# Patient Record
Sex: Female | Born: 1980 | Race: Black or African American | Hispanic: No | Marital: Single | Smoking: Current every day smoker
Health system: Southern US, Community
[De-identification: ages and names within clinical notes are randomized; demographics above are authoritative.]

## PROBLEM LIST (undated history)

## (undated) DIAGNOSIS — F1010 Alcohol abuse, uncomplicated: Secondary | ICD-10-CM

## (undated) DIAGNOSIS — F329 Major depressive disorder, single episode, unspecified: Secondary | ICD-10-CM

## (undated) DIAGNOSIS — F1910 Other psychoactive substance abuse, uncomplicated: Secondary | ICD-10-CM

## (undated) DIAGNOSIS — F1410 Cocaine abuse, uncomplicated: Secondary | ICD-10-CM

## (undated) DIAGNOSIS — F32A Depression, unspecified: Secondary | ICD-10-CM

## (undated) DIAGNOSIS — F419 Anxiety disorder, unspecified: Secondary | ICD-10-CM

---

## 2013-02-08 ENCOUNTER — Encounter (HOSPITAL_COMMUNITY): Payer: Self-pay | Admitting: Emergency Medicine

## 2013-02-08 ENCOUNTER — Emergency Department (HOSPITAL_COMMUNITY)
Admission: EM | Admit: 2013-02-08 | Discharge: 2013-02-09 | Disposition: A | Discharge status: Other Acute Inpatient Hospital | Payer: MEDICAID | Source: Home / Self Care | Attending: Emergency Medicine | Admitting: Emergency Medicine

## 2013-02-08 DIAGNOSIS — E876 Hypokalemia: Secondary | ICD-10-CM

## 2013-02-08 DIAGNOSIS — Z79899 Other long term (current) drug therapy: Secondary | ICD-10-CM | POA: Insufficient documentation

## 2013-02-08 DIAGNOSIS — F419 Anxiety disorder, unspecified: Secondary | ICD-10-CM

## 2013-02-08 DIAGNOSIS — F329 Major depressive disorder, single episode, unspecified: Secondary | ICD-10-CM

## 2013-02-08 DIAGNOSIS — Z880 Allergy status to penicillin: Secondary | ICD-10-CM | POA: Insufficient documentation

## 2013-02-08 DIAGNOSIS — F1010 Alcohol abuse, uncomplicated: Secondary | ICD-10-CM | POA: Insufficient documentation

## 2013-02-08 DIAGNOSIS — F1210 Cannabis abuse, uncomplicated: Secondary | ICD-10-CM | POA: Insufficient documentation

## 2013-02-08 DIAGNOSIS — F1910 Other psychoactive substance abuse, uncomplicated: Secondary | ICD-10-CM

## 2013-02-08 DIAGNOSIS — Z8659 Personal history of other mental and behavioral disorders: Secondary | ICD-10-CM | POA: Insufficient documentation

## 2013-02-08 DIAGNOSIS — F17200 Nicotine dependence, unspecified, uncomplicated: Secondary | ICD-10-CM | POA: Insufficient documentation

## 2013-02-08 HISTORY — DX: Major depressive disorder, single episode, unspecified: F32.9

## 2013-02-08 HISTORY — DX: Depression, unspecified: F32.A

## 2013-02-08 LAB — RAPID URINE DRUG SCREEN, HOSP PERFORMED
Amphetamines: NOT DETECTED
Benzodiazepines: NOT DETECTED
Cocaine: POSITIVE — AB
Opiates: NOT DETECTED

## 2013-02-08 LAB — ETHANOL: Alcohol, Ethyl (B): 60 mg/dL — ABNORMAL HIGH (ref 0–11)

## 2013-02-08 LAB — COMPREHENSIVE METABOLIC PANEL
AST: 32 U/L (ref 0–37)
Albumin: 3.8 g/dL (ref 3.5–5.2)
BUN: 12 mg/dL (ref 6–23)
CO2: 31 mEq/L (ref 19–32)
Calcium: 9.8 mg/dL (ref 8.4–10.5)
Creatinine, Ser: 1.23 mg/dL — ABNORMAL HIGH (ref 0.50–1.10)
GFR calc non Af Amer: 57 mL/min — ABNORMAL LOW (ref >90)

## 2013-02-08 LAB — CBC
HCT: 42.7 % (ref 36.0–46.0)
Hemoglobin: 15.6 g/dL — ABNORMAL HIGH (ref 12.0–15.0)
MCH: 33.1 pg (ref 26.0–34.0)
MCV: 90.5 fL (ref 78.0–100.0)
Platelets: 258 10*3/uL (ref 150–400)
RBC: 4.72 MIL/uL (ref 3.87–5.11)
RDW: 13.3 % (ref 11.5–15.5)

## 2013-02-08 LAB — SALICYLATE LEVEL: Salicylate Lvl: <2 mg/dL — ABNORMAL LOW (ref 2.8–20.0)

## 2013-02-08 MED ORDER — LORAZEPAM 2 MG/ML IJ SOLN: 1.0000 mg | Freq: Four times a day (QID) | INTRAMUSCULAR | Status: DC | PRN

## 2013-02-08 MED ORDER — LORAZEPAM 1 MG PO TABS
0.0000 mg | ORAL_TABLET | Freq: Four times a day (QID) | ORAL | Status: DC
  Administered 2013-02-08: 2 mg via ORAL
  Administered 2013-02-09: 1 mg via ORAL
  Filled 2013-02-08: qty 2
  Filled 2013-02-08: qty 1

## 2013-02-08 MED ORDER — LORAZEPAM 1 MG PO TABS: 1.0000 mg | ORAL_TABLET | Freq: Four times a day (QID) | ORAL | Status: DC | PRN

## 2013-02-08 MED ORDER — VITAMIN B-1 100 MG PO TABS
100.0000 mg | ORAL_TABLET | Freq: Every day | ORAL | Status: DC
  Administered 2013-02-08 – 2013-02-09 (×2): 100 mg via ORAL
  Filled 2013-02-08 (×2): qty 1

## 2013-02-08 MED ORDER — THIAMINE HCL 100 MG/ML IJ SOLN: 100.0000 mg | Freq: Every day | INTRAMUSCULAR | Status: DC

## 2013-02-08 MED ORDER — LORAZEPAM 1 MG PO TABS: 0.0000 mg | ORAL_TABLET | Freq: Two times a day (BID) | ORAL | Status: DC

## 2013-02-08 MED ORDER — PROMETHAZINE HCL 25 MG PO TABS
25.0000 mg | ORAL_TABLET | Freq: Once | ORAL | Status: AC
  Administered 2013-02-08: 25 mg via ORAL
  Filled 2013-02-08: qty 1

## 2013-02-08 MED ORDER — ADULT MULTIVITAMIN W/MINERALS CH
1.0000 | ORAL_TABLET | Freq: Every day | ORAL | Status: DC
  Administered 2013-02-08 – 2013-02-09 (×2): 1 via ORAL
  Filled 2013-02-08 (×2): qty 1

## 2013-02-08 MED ORDER — PANTOPRAZOLE SODIUM 40 MG PO TBEC
40.0000 mg | DELAYED_RELEASE_TABLET | Freq: Every day | ORAL | Status: DC
  Administered 2013-02-09: 40 mg via ORAL
  Filled 2013-02-08: qty 1

## 2013-02-08 MED ORDER — POTASSIUM CHLORIDE CRYS ER 20 MEQ PO TBCR
40.0000 meq | EXTENDED_RELEASE_TABLET | Freq: Once | ORAL | Status: AC
  Administered 2013-02-08: 40 meq via ORAL
  Filled 2013-02-08: qty 2

## 2013-02-08 MED ORDER — FOLIC ACID 1 MG PO TABS
1.0000 mg | ORAL_TABLET | Freq: Every day | ORAL | Status: DC
  Administered 2013-02-08 – 2013-02-09 (×2): 1 mg via ORAL
  Filled 2013-02-08 (×2): qty 1

## 2013-02-08 NOTE — BH Assessment (Signed)
This TTS attempted to assess patient at 2120 pt was lethagic and sleepy and could not stay awake to answer questions during TTS assessment. Pt will need to be assessed in the morning. Spoke with PA [employer]" S.  At [facility] ED who is aware and in agreement with patient being assessed tommorow morning.   Glorious Peach, MS, LCASA Assessment Counselor

## 2013-02-08 NOTE — ED Provider Notes (Signed)
CSN: [id number]     Arrival date & time 02/08/13  1816 History   First MD Initiated Contact with Patient 02/08/13 1910     Chief Complaint  Patient presents with  . needs detox    (Consider location/radiation/quality/duration/timing/severity/associated sxs/prior Treatment) HPI Comments: 32 year old female presents for voluntary detox. She states that she has been abusing alcohol and cocaine for a long time and has been spending $1000 a day on these addictions. She states that she last drank alcohol about 15 minutes prior to arrival and last had cocaine last night. She denies any suicidal or homicidal ideations. She's not know if she's been to detox program before.   Past Medical History  Diagnosis Date  . Depression    History reviewed. No pertinent past surgical history. No family history on file. History  Substance Use Topics  . Smoking status: Current Every Day Smoker  . Smokeless tobacco: Not on file  . Alcohol Use: Yes   OB History   Grav Para Term Preterm Abortions TAB SAB Ect Mult Living                 Review of Systems  Respiratory: Negative for shortness of breath.   Cardiovascular: Negative for chest pain.  Gastrointestinal: Negative for vomiting and abdominal pain.  Psychiatric/Behavioral: Negative for suicidal ideas, self-injury and agitation.  All other systems reviewed and are negative.    Allergies  Penicillins and Zofran  Home Medications   Current Outpatient Rx  Name  Route  Sig  Dispense  Refill  . pantoprazole (PROTONIX) 40 MG tablet   Oral   Take 40 mg by mouth daily.         . potassium chloride SA (K-DUR,KLOR-CON) 20 MEQ tablet   Oral   Take 20 mEq by mouth 2 (two) times daily.          BP 117/72  Pulse 75  Temp(Src) 98.2 F (36.8 C) (Oral)  Resp 18  SpO2 98%  LMP 01/08/2013 Physical Exam  Nursing note and vitals reviewed. Constitutional: She is oriented to person, place, and time. She appears well-developed and well-nourished.   HENT:  Head: Normocephalic and atraumatic.  Right Ear: External ear normal.  Left Ear: External ear normal.  Nose: Nose normal.  Eyes: Right eye exhibits no discharge. Left eye exhibits no discharge.  Cardiovascular: Normal rate, regular rhythm and normal heart sounds.   Pulmonary/Chest: Effort normal and breath sounds normal.  Abdominal: Soft. There is no tenderness.  Neurological: She is alert and oriented to person, place, and time.  Skin: Skin is warm and dry.    ED Course  Procedures (including critical care time) Labs Review Labs Reviewed  CBC - Abnormal; Notable for the following:    Hemoglobin 15.6 (*)    MCHC 36.5 (*)    All other components within normal limits  COMPREHENSIVE METABOLIC PANEL - Abnormal; Notable for the following:    Potassium 2.7 (*)    Chloride 93 (*)    Glucose, Bld 102 (*)    Creatinine, Ser 1.23 (*)    GFR calc non Af Amer 57 (*)    GFR calc Af Amer 66 (*)    All other components within normal limits  ETHANOL - Abnormal; Notable for the following:    Alcohol, Ethyl (B) 60 (*)    All other components within normal limits  SALICYLATE LEVEL - Abnormal; Notable for the following:    Salicylate Lvl <2.0 (*)    All other components  within normal limits  URINE RAPID DRUG SCREEN (HOSP PERFORMED) - Abnormal; Notable for the following:    Cocaine POSITIVE (*)    All other components within normal limits  ACETAMINOPHEN LEVEL   Imaging Review No results found.  EKG Interpretation   None       MDM   1. Alcohol abuse   2. Drug abuse   3. Hypokalemia    Will consult [facility] for voluntary detox. Patient has hypokalemia but this is chronic (takes daily potassium), will supplement with PO while in ED.    Audree Camel, MD 02/08/13 2051

## 2013-02-08 NOTE — ED Notes (Signed)
Pt too nauseous to tolerate large pill. MD notified. Will attempt again after ativan and phenergan.

## 2013-02-08 NOTE — BH Assessment (Signed)
Spoke with EDP Dr.Rancour who reports that Dr. Criss Alvine whom is now gone for the evening evaluated patient earlier  and  requested TTS consult for patient who is requesting Alcohol Detox.   Glorious Peach, MS, LCASA Assessment Counselor

## 2013-02-08 NOTE — ED Notes (Signed)
The pt waqnts to be detoxed from alcohol and cocaine.  Alcohol 5 minutes and last cocaine was lastnight

## 2013-02-09 ENCOUNTER — Encounter (HOSPITAL_COMMUNITY): Payer: Self-pay

## 2013-02-09 ENCOUNTER — Inpatient Hospital Stay (HOSPITAL_COMMUNITY)
Admission: AD | Admit: 2013-02-09 | Discharge: 2013-02-13 | DRG: 897 | Disposition: A | Discharge status: Home / Self Care | Payer: MEDICAID | Source: Intra-hospital | Attending: Psychiatry | Admitting: Psychiatry

## 2013-02-09 DIAGNOSIS — F1020 Alcohol dependence, uncomplicated: Secondary | ICD-10-CM

## 2013-02-09 DIAGNOSIS — F1420 Cocaine dependence, uncomplicated: Secondary | ICD-10-CM | POA: Diagnosis present

## 2013-02-09 LAB — MAGNESIUM: Magnesium: 1.6 mg/dL (ref 1.5–2.5)

## 2013-02-09 LAB — POTASSIUM
Potassium: 2.6 mEq/L — CL (ref 3.5–5.1)
Potassium: 3.8 mEq/L (ref 3.5–5.1)

## 2013-02-09 MED ORDER — ONDANSETRON 4 MG PO TBDP
4.0000 mg | ORAL_TABLET | Freq: Four times a day (QID) | ORAL | Status: AC | PRN
  Filled 2013-02-09 (×2): qty 1

## 2013-02-09 MED ORDER — LOPERAMIDE HCL 2 MG PO CAPS: 2.0000 mg | ORAL_CAPSULE | ORAL | Status: DC | PRN

## 2013-02-09 MED ORDER — POTASSIUM CHLORIDE CRYS ER 20 MEQ PO TBCR
40.0000 meq | EXTENDED_RELEASE_TABLET | Freq: Once | ORAL | Status: AC
  Administered 2013-02-09: 40 meq via ORAL
  Filled 2013-02-09: qty 2

## 2013-02-09 MED ORDER — CHLORDIAZEPOXIDE HCL 25 MG PO CAPS
25.0000 mg | ORAL_CAPSULE | Freq: Every day | ORAL | Status: AC
  Administered 2013-02-13: 25 mg via ORAL
  Filled 2013-02-09: qty 1

## 2013-02-09 MED ORDER — CLONIDINE HCL 0.1 MG PO TABS
0.1000 mg | ORAL_TABLET | Freq: Four times a day (QID) | ORAL | Status: DC
  Administered 2013-02-09: 0.1 mg via ORAL
  Filled 2013-02-09 (×11): qty 1

## 2013-02-09 MED ORDER — ONDANSETRON 4 MG PO TBDP
4.0000 mg | ORAL_TABLET | Freq: Four times a day (QID) | ORAL | Status: DC | PRN
  Administered 2013-02-10 – 2013-02-11 (×4): 4 mg via ORAL
  Filled 2013-02-09 (×2): qty 1

## 2013-02-09 MED ORDER — METHOCARBAMOL 500 MG PO TABS: 500.0000 mg | ORAL_TABLET | Freq: Three times a day (TID) | ORAL | Status: DC | PRN

## 2013-02-09 MED ORDER — POTASSIUM CHLORIDE CRYS ER 20 MEQ PO TBCR: 40.0000 meq | EXTENDED_RELEASE_TABLET | Freq: Once | ORAL | Status: AC

## 2013-02-09 MED ORDER — LOPERAMIDE HCL 2 MG PO CAPS: 2.0000 mg | ORAL_CAPSULE | ORAL | Status: AC | PRN

## 2013-02-09 MED ORDER — HYDROXYZINE HCL 25 MG PO TABS
25.0000 mg | ORAL_TABLET | Freq: Four times a day (QID) | ORAL | Status: DC | PRN
  Administered 2013-02-11 – 2013-02-12 (×2): 25 mg via ORAL
  Filled 2013-02-09 (×2): qty 1

## 2013-02-09 MED ORDER — ACETAMINOPHEN 325 MG PO TABS
650.0000 mg | ORAL_TABLET | Freq: Four times a day (QID) | ORAL | Status: DC | PRN
Start: 2013-02-09 — End: 2013-02-13
  Administered 2013-02-12 – 2013-02-13 (×3): 650 mg via ORAL
  Filled 2013-02-09 (×3): qty 2

## 2013-02-09 MED ORDER — HYDROXYZINE HCL 25 MG PO TABS: 25.0000 mg | ORAL_TABLET | Freq: Four times a day (QID) | ORAL | Status: DC | PRN

## 2013-02-09 MED ORDER — CLONIDINE HCL 0.1 MG PO TABS
0.1000 mg | ORAL_TABLET | ORAL | Status: DC
  Filled 2013-02-09: qty 1

## 2013-02-09 MED ORDER — DICYCLOMINE HCL 20 MG PO TABS
20.0000 mg | ORAL_TABLET | Freq: Four times a day (QID) | ORAL | Status: DC | PRN
  Administered 2013-02-09: 20 mg via ORAL
  Filled 2013-02-09: qty 1

## 2013-02-09 MED ORDER — CHLORDIAZEPOXIDE HCL 25 MG PO CAPS
25.0000 mg | ORAL_CAPSULE | Freq: Four times a day (QID) | ORAL | Status: AC | PRN
  Administered 2013-02-12: 25 mg via ORAL
  Filled 2013-02-09: qty 1

## 2013-02-09 MED ORDER — POTASSIUM CHLORIDE 10 MEQ/100ML IV SOLN
10.0000 meq | Freq: Once | INTRAVENOUS | Status: AC
  Administered 2013-02-09: 10 meq via INTRAVENOUS

## 2013-02-09 MED ORDER — VITAMIN B-1 100 MG PO TABS
100.0000 mg | ORAL_TABLET | Freq: Every day | ORAL | Status: DC
  Administered 2013-02-10 – 2013-02-13 (×3): 100 mg via ORAL
  Filled 2013-02-09 (×5): qty 1

## 2013-02-09 MED ORDER — CHLORDIAZEPOXIDE HCL 25 MG PO CAPS
25.0000 mg | ORAL_CAPSULE | ORAL | Status: AC
  Administered 2013-02-12: 25 mg via ORAL
  Filled 2013-02-09: qty 1

## 2013-02-09 MED ORDER — CHLORDIAZEPOXIDE HCL 25 MG PO CAPS
25.0000 mg | ORAL_CAPSULE | Freq: Four times a day (QID) | ORAL | Status: AC
  Administered 2013-02-09 – 2013-02-10 (×5): 25 mg via ORAL
  Filled 2013-02-09 (×6): qty 1

## 2013-02-09 MED ORDER — THIAMINE HCL 100 MG/ML IJ SOLN: 100.0000 mg | Freq: Once | INTRAMUSCULAR | Status: DC

## 2013-02-09 MED ORDER — CLONIDINE HCL 0.1 MG PO TABS: 0.1000 mg | ORAL_TABLET | Freq: Every day | ORAL | Status: DC

## 2013-02-09 MED ORDER — ADULT MULTIVITAMIN W/MINERALS CH
1.0000 | ORAL_TABLET | Freq: Every day | ORAL | Status: DC
  Administered 2013-02-11: 1 via ORAL
  Filled 2013-02-09 (×6): qty 1

## 2013-02-09 MED ORDER — POTASSIUM CHLORIDE 10 MEQ/100ML IV SOLN
10.0000 meq | INTRAVENOUS | Status: AC
  Administered 2013-02-09 (×2): 10 meq via INTRAVENOUS
  Filled 2013-02-09 (×2): qty 100

## 2013-02-09 MED ORDER — ALUM & MAG HYDROXIDE-SIMETH 200-200-20 MG/5ML PO SUSP: 30.0000 mL | ORAL | Status: DC | PRN

## 2013-02-09 MED ORDER — MAGNESIUM HYDROXIDE 400 MG/5ML PO SUSP: 30.0000 mL | Freq: Every day | ORAL | Status: DC | PRN

## 2013-02-09 MED ORDER — TRAZODONE HCL 50 MG PO TABS
50.0000 mg | ORAL_TABLET | Freq: Every evening | ORAL | Status: DC | PRN
  Administered 2013-02-09 – 2013-02-11 (×3): 50 mg via ORAL
  Filled 2013-02-09: qty 3
  Filled 2013-02-09 (×2): qty 1
  Filled 2013-02-09: qty 14
  Filled 2013-02-09: qty 1

## 2013-02-09 MED ORDER — NAPROXEN 500 MG PO TABS: 500.0000 mg | ORAL_TABLET | Freq: Two times a day (BID) | ORAL | Status: DC | PRN

## 2013-02-09 MED ORDER — CHLORDIAZEPOXIDE HCL 25 MG PO CAPS
25.0000 mg | ORAL_CAPSULE | Freq: Three times a day (TID) | ORAL | Status: AC
  Administered 2013-02-11 (×3): 25 mg via ORAL
  Filled 2013-02-09 (×4): qty 1

## 2013-02-09 NOTE — ED Notes (Signed)
Pt taken off monitor-- up to use phone and ambulated to restroom without difficulty

## 2013-02-09 NOTE — ED Notes (Signed)
Pt called out for nurse, c/o pain in arm. Line flushed with NS.  Pt fell asleep mid sentence when talking to me.  Potassium gtt restarted.

## 2013-02-09 NOTE — ED Notes (Signed)
Maury Dus made aware pt on way to facility. [facility] given pt belongings and RN transported pt to vehicle. Pt in NAD- ambulatory with steady gait.

## 2013-02-09 NOTE — ED Notes (Signed)
EDP made aware of pt's potassium.  Verbal given for 40 mEq of Potassium PO.  Potassium will be rechecked within an hr. Pt otherwise stable, alert and oriented x 4. No new concerns by pt.

## 2013-02-09 NOTE — ED Notes (Signed)
TC to Lab for results of repeat potassium. Lab reported the test is resulting now.

## 2013-02-09 NOTE — ED Notes (Signed)
CIWA zero, will not give ativan at this time due to pt extreme sleepiness.
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
## 2013-02-09 NOTE — Progress Notes (Signed)
Voluntary admission for 32 y.o. Female with flat affect and irritable mood.  Pt. Requesting detox from Alcohol and crack cocaine.  Reports that she has been using $1000.00 worth of crack cocaine along with however much she can get of Alcohol daily.  Last use of drugs and alcohol 02/08/2013.  Pt.  Denies SI/HI and denies A/V hallucinations and contracts for safety.  Fluids given and pt. Went to [employer] for dinner.  Encouragement and support given.

## 2013-02-09 NOTE — BH Assessment (Signed)
Writer called EDP-Dr. Denton Lank to obtain clinical information prior to seeing this patient.

## 2013-02-09 NOTE — BH Assessment (Signed)
Pt accepted at [facility] by Emmaline Life, NP to Dr. Dub Mikes, per [facility]. Nurse made aware.  -Dossie Arbour, MA  Mental Health Tech

## 2013-02-09 NOTE — ED Notes (Signed)
Report called by Anell Barr RN to Maury Dus at [facility]. Dr. Denton Lank made aware of need to start EMTALA form. sts will start when Potassium results.

## 2013-02-09 NOTE — ED Notes (Signed)
Assisted pt up to BR.  Extremely sleepy.  Pt does not recall me speaking to her about placing an IV and giving her KCl riders for low K+.

## 2013-02-09 NOTE — Progress Notes (Signed)
D     Pt in bed resting with eyes closed  No distress noted A   Continue 15 min checks and evaluate withdrawal symptoms R   Pt safe

## 2013-02-09 NOTE — Progress Notes (Addendum)
Pt was searched and brought back on the unit.  She was hungry and was quite irritable.  Provided food and ginger ale per pt's request. Pt has a 33 year old daughter in the foster system which is pt's motivation to get clean and stay clean. She was oriented to unit and denied any S/H ideation or A/V/H.  Report will be given to on-coming shift. Pt is on her menses.

## 2013-02-09 NOTE — ED Notes (Signed)
Dr Ranae Palms notified of new K+ result from lab.  T.O. Received and placed.

## 2013-02-09 NOTE — ED Notes (Signed)
No change in status.  Sleeping heavily; reported she had been using cocaine x 4 days nonstop; was very hungry and tired.

## 2013-02-09 NOTE — ED Notes (Signed)
Pt on menses at present

## 2013-02-09 NOTE — ED Notes (Signed)
[facility] called 

## 2013-02-09 NOTE — ED Notes (Signed)
First run KCL completed.

## 2013-02-09 NOTE — Tx Team (Signed)
Initial Interdisciplinary Treatment Plan  PATIENT STRENGTHS: (choose at least two) Average or above average intelligence Capable of independent living  PATIENT STRESSORS: Financial difficulties Substance abuse   PROBLEM LIST: Problem List/Patient Goals Date to be addressed Date deferred Reason deferred Estimated date of resolution  Substance Abuse 02/09/13     Depression 02/09/13     Goal-respect for myself, Be open to constructive criticism                                           DISCHARGE CRITERIA:  Improved stabilization in mood, thinking, and/or behavior Motivation to continue treatment in a less acute level of care Need for constant or close observation no longer present Reduction of life-threatening or endangering symptoms to within safe limits Verbal commitment to aftercare and medication compliance Withdrawal symptoms are absent or subacute and managed without 24-hour nursing intervention  PRELIMINARY DISCHARGE PLAN: Attend 12-step recovery group  PATIENT/FAMIILY INVOLVEMENT: This treatment plan has been presented to and reviewed with the patient, Judy [name], and/or family member,.  The patient and family have been given the opportunity to ask questions and make suggestions.  [name] 02/09/2013, 4:50 PM

## 2013-02-09 NOTE — ED Notes (Signed)
Lupita Leash RN at [facility] notified about potassium results.

## 2013-02-09 NOTE — ED Provider Notes (Signed)
Pt alert, content, vitals normal, nad.  Repeat k 3.8.  Discussed with psych team, they indicate pt accepted to [facility], Dr Dub Mikes, bed ready.      Suzi Roots, MD 02/09/13 [phone]

## 2013-02-09 NOTE — ED Notes (Signed)
Placed on monitor due to low K+.  NSR without ectopy noted. NIBP on q 1hr.  Pulse ox in place.  Pt remains very sleepy, wakens to voice but difficult to keep her attention.

## 2013-02-10 ENCOUNTER — Encounter (HOSPITAL_COMMUNITY): Payer: Self-pay | Admitting: Psychiatry

## 2013-02-10 DIAGNOSIS — F10229 Alcohol dependence with intoxication, unspecified: Secondary | ICD-10-CM

## 2013-02-10 DIAGNOSIS — F1020 Alcohol dependence, uncomplicated: Secondary | ICD-10-CM | POA: Diagnosis present

## 2013-02-10 DIAGNOSIS — F329 Major depressive disorder, single episode, unspecified: Secondary | ICD-10-CM

## 2013-02-10 DIAGNOSIS — F1420 Cocaine dependence, uncomplicated: Secondary | ICD-10-CM | POA: Diagnosis present

## 2013-02-10 MED ORDER — ENSURE COMPLETE PO LIQD
237.0000 mL | Freq: Two times a day (BID) | ORAL | Status: DC
  Administered 2013-02-10 – 2013-02-12 (×5): 237 mL via ORAL

## 2013-02-10 MED ORDER — PANTOPRAZOLE SODIUM 40 MG PO TBEC
40.0000 mg | DELAYED_RELEASE_TABLET | Freq: Every day | ORAL | Status: DC
  Administered 2013-02-10 – 2013-02-13 (×3): 40 mg via ORAL
  Filled 2013-02-10 (×7): qty 1

## 2013-02-10 MED ORDER — POTASSIUM CHLORIDE CRYS ER 20 MEQ PO TBCR
20.0000 meq | EXTENDED_RELEASE_TABLET | Freq: Two times a day (BID) | ORAL | Status: DC
  Administered 2013-02-10 – 2013-02-13 (×4): 20 meq via ORAL
  Filled 2013-02-10 (×6): qty 1
  Filled 2013-02-10: qty 2
  Filled 2013-02-10 (×3): qty 1
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
## 2013-02-10 NOTE — Progress Notes (Signed)
Nutrition Brief Note  Patient identified on the Malnutrition Screening Tool (MST) Report.  Wt Readings from Last 10 Encounters:  02/09/13 121 lb (54.885 kg)   Body mass index is 21.44 kg/(m^2). Patient meets criteria for normal weight based on current BMI.   Hx of alcohol and cocaine intoxication, requiring detoxification treatment, and eating disorder. Discussed intake PTA with patient and compared to intake presently.  Discussed changes in intake, if any, and encouraged adequate intake of meals and snacks. Pt reports going from 170 pounds to 121 pounds in the past 5 months. States it's because she messed up her stomach from drinking and doing drugs. Did not go into detail but states she's not been eating well PTA. Denies any nausea today. Interested in getting Ensure. C/o not feeling well r/t not having her Protonix.   Current diet order is regular and pt is also offered choice of unit snacks mid-morning and mid-afternoon.  Pt is eating as desired.   Labs and medications reviewed. Potassium was low but is now normal, magnesium WNL. Getting multivitamin and thiamine.   Nutrition Dx:  Unintended wt change r/t suboptimal oral intake AEB pt report  Interventions:   Discussed the importance of nutrition and encouraged intake of food and beverages.     Supplements: Ensure Complete BID   Recommend outpatient RD follow up to address history of eating disorder if pt shows signs of restriction/purging during admission. Pt did not elaborate on dietary habits during conversation, focused on not feeling well r/t lack of Protonix, but did request Ensure.   No additional nutrition interventions warranted at this time. If nutrition issues arise, please consult RD.   Levon Hedger MS, RD, LDN [phone] Pager [phone] After Hours Pager

## 2013-02-10 NOTE — [facility] Group Notes (Signed)
[facility] LCSW Group Therapy  02/10/2013 2:57 PM  Type of Therapy:  Group Therapy  Participation Level:  Did Not Attend  Smart, Kalana Yust 02/10/2013, 2:57 PM  

## 2013-02-10 NOTE — [facility] Group Notes (Signed)
[facility] LCSW Aftercare Discharge Planning Group Note   02/10/2013 9:56 AM  Participation Quality:  DID NOT ATTEND   Smart, Lebron Quam

## 2013-02-10 NOTE — Progress Notes (Signed)
Patient did attend the evening karaoke group. Pt participated by singing a couple of songs.

## 2013-02-10 NOTE — Progress Notes (Signed)
D: pt. Is very attention seeking and dramatic. Pt. Stated to Clinical research associate that, " you don't care about me, you don't like me." pt asked writer for candy bars, an extra food tray, stating that another nurse said it was okay for her to have them. Pt was able to acquire a snickers bar and extra snack, yet pt was still complaining about cravings and being hungry. Pt. Became teary eyed while in AA group, pt ran out, and later came back with a box of tissue. Pt stated she cannot get off of cocaine alone, it scares her to think about not using. Pt stated she has been using for so long that she doesn't know how not to use. Pt stated that today was a very humbling day, because of the groups she attended and talking to other people. Pt. Denies SI/HI/AVH. Denies Pain. A: Support and encouragement given to pt. q 15 min safety checks. Medications given. 1:1 time with pt. R: pt. Remains safe on unit. Pt. Able to obtain snacks to satisfy cravings for the time being. No complaints or signs of distress at this time.

## 2013-02-10 NOTE — [facility] Suicide Risk Assessment (Signed)
Suicide Risk Assessment  Admission Assessment     Nursing information obtained from:  Patient Demographic factors:  Low socioeconomic status;Unemployed Current Mental Status:   (Denies) Loss Factors:  Financial problems / change in socioeconomic status Historical Factors:   (denies) Risk Reduction Factors:  Religious beliefs about death;Living with another person, especially a relative  CLINICAL FACTORS:   Alcohol/Substance Abuse/Dependencies  COGNITIVE FEATURES THAT CONTRIBUTE TO RISK:  Closed-mindedness Polarized thinking Thought constriction (tunnel vision)    SUICIDE RISK:   Mild:  Suicidal ideation of limited frequency, intensity, duration, and specificity.  There are no identifiable plans, no associated intent, mild dysphoria and related symptoms, good self-control (both objective and subjective assessment), few other risk factors, and identifiable protective factors, including available and accessible social support.  PLAN OF CARE: Supportive approach/coping skills                               Detox/reassess and address the co morbidities  I certify that inpatient services furnished can reasonably be expected to improve the patient's condition.  Isobel Eisenhuth A 02/10/2013, 3:25 PM

## 2013-02-10 NOTE — B[facility] Suicide Risk Assessment (Signed)
[facility] INPATIENT: Family/Significant Other Suicide Prevention Education  Suicide Prevention Education:  Education Completed; No one has been identified by the patient as the family member/significant other with whom the patient will be residing, and identified as the person(s) who will aid the patient in the event of a mental health crisis (suicidal ideations/suicide attempt).   Pt did not c/o SI at admission, nor have they endorsed SI during their stay here. SPE not required. SPI pamphlet provided to pt and she was encouraged to share information with her support network.   [employer], LCSWA 02/10/2013 3:40 PM

## 2013-02-10 NOTE — Tx Team (Addendum)
Interdisciplinary Treatment Plan Update (Adult)  Date: 02/10/2013   Time Reviewed: 11:44 AM  Progress in Treatment:  Attending groups: No.  Participating in groups:  No.  Taking medication as prescribed: Yes  Tolerating medication: Yes  Family/Significant othe contact made: No. SPE not required for this pt.  Patient understands diagnosis: Yes, AEB seeking treatment for ETOH detox, substance abuse (cocaine), mood stabilization, and medication management.  Discussing patient identified problems/goals with staff: Yes  Medical problems stabilized or resolved: Yes  Denies suicidal/homicidal ideation: Yes during admission and self report.  Patient has not harmed self or Others: Yes  New problem(s) identified: n/a  Discharge Plan or Barriers: Pt currently unsure what she wants/if she wants to stop using. CSW assessing for appropriate referrals.  Additional comments: Judy [name] is an 32 y.o. female wit history of depression. She presents to [facility] brought by her boyfriend. She states that she has been abusing alcohol and cocaine for a long time and has been spending $1000+ a day on these addictions. Says she received a large "lump sump" of money after her father passed away. Patient now has a payee to help her manage the money she received from her father. She started drinking alcohol and cocaine at age 35. She binges on alcohol and cocaine all day and night. Patient was unable to provide a specific amount of use. She states that she last drank alcohol and used cocaine about 15 minutes prior to arrival to the ED last night. Patient reports several withdrawal symptoms: nausea, vomiting, diarrhea, agitation, tremors, and hot/cold flashes. Patient reports previous substance abuse treatment at [facility], [facility], and a facility in [facility]. She does not have any outpatient mental health/substance abuse providers. Her longest period of sobriety is 4 yrs. She denies any suicidal or homicidal ideations. Patient denies  AVH's. No hx of SI, HI, or AVH's. Patient reports depressive symptoms. Sts she has loss of interest in usual pleasures and isolates herself from others. She also has increased anxiety.   Reason for Continuation of Hospitalization: Clonidine/librium taper-withdrawals Mood stabilization Medication management  Estimated length of stay: 1-2 days (per Dr. Dub Mikes, pt stable for d/c 11/22 Sat. Pt not participating in groups, and reports that she is not ready to stop using drugs.) Pt refusing aftercare.  For review of initial/current patient goals, please see plan of care.  Attendees:  Patient:    Family:    Physician: Geoffery Lyons MD. 02/10/2013 11:52 AM   Nursing: Vanessa Kick RN  02/10/2013 11:52 AM   Clinical Social Worker Nirvi Boehler Smart, LCSWA  02/10/2013 11:52 AM   Other: Philippa Chester RN  02/10/2013 11:52 AM   Other:    Other: Darden Dates Nurse CM  02/10/2013 11:52 AM   Other:    Scribe for Treatment Team:  [employer] LCSWA 02/10/2013 11:52 AM

## 2013-02-10 NOTE — Progress Notes (Signed)
Pt c/o nausea and requested Phenegan. While Clinical research associate was preparing medications pt walked to the trash can and vomited. Pt had an order for zofran. She reported that she could not take zofran. Assessed reaction to medication and pt stated no reaction "It does not help." Explained to pt that the MD had to order medications and she said that she would take the zofran and discuss with the MD. Followed up with pt after taking the medication and she reported that it did help. She has been drinking Gatorade and talking to peers. During group, pt was lying in bed. When writer encouraged pt to attend group, she said that she needed to rest from being sick earlier. Pt denies si and hi. Safety maintained on the unit.

## 2013-02-10 NOTE — Progress Notes (Signed)
Pt. Came up to nurses station asking for ginger ale and Gatorade. Writer gave pt. A ginger ale. After pt. Finished with ginger ale, pt asked for more ginger ale and Gatorade. Writer offered pt. Water. Pt. Stated she was feeling nauseous, Clinical research associate offered pt. A Zofran, pt. Refused getting a Zofran. Instead pt. Became irritable and short with Clinical research associate saying " you're ignoring me and not giving me what i want." writer reiterated to pt that she would get pt some water and a Zofran if the pt were feeling nauseous. Pt walked away asking for another nurse.

## 2013-02-10 NOTE — Progress Notes (Addendum)
[facility] Adult Case Management Discharge Plan :  Will you be returning to the same living situation after discharge: Yes,  home with bf At discharge, do you have transportation home?:Yes,  boyfriend or bus pass Do you have the ability to pay for your medications:Yes,  medicaid  Pt refused all follow-up.   Patient denies SI/HI:   Yes,  during admission/self report    Safety Planning and Suicide Prevention discussed:  Yes,  SPE not required for this pt as she did not endorse SI during admission or stay at [facility]. SPI pamphlet provided to pt and she was encouraged to share with her support network.   Smart, Vivyan Biggers LCSWA 02/10/2013, 3:40 PM   Pt did not d/c this weekend due to severity of withdrawals. Pt decided that she would like [facility] referral on Monday. Please see updated d/c plan completed on 02/13/13.   [employer], LCSWA 02/13/2013 11:41 AM

## 2013-02-10 NOTE — [facility] Group Notes (Signed)
Adult Psychoeducational Group Note  Date:  02/10/2013 Time:  9:55 PM  Group Topic/Focus:  AA Meeting  Participation Level:  Active  Participation Quality:  Appropriate  Affect:  Tearful  Cognitive:  Appropriate  Insight: Appropriate  Engagement in Group:  Engaged  Modes of Intervention:  Discussion and Education  Additional Comments:  Gelsey attended [employer].  Caroll Rancher A 02/10/2013, 9:55 PM
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
## 2013-02-11 DIAGNOSIS — F10239 Alcohol dependence with withdrawal, unspecified: Secondary | ICD-10-CM

## 2013-02-11 DIAGNOSIS — F1910 Other psychoactive substance abuse, uncomplicated: Secondary | ICD-10-CM

## 2013-02-11 DIAGNOSIS — F1010 Alcohol abuse, uncomplicated: Secondary | ICD-10-CM

## 2013-02-11 DIAGNOSIS — F10939 Alcohol use, unspecified with withdrawal, unspecified: Secondary | ICD-10-CM

## 2013-02-11 MED ORDER — PANTOPRAZOLE SODIUM 40 MG PO TBEC: 40.0000 mg | DELAYED_RELEASE_TABLET | Freq: Every day | ORAL | Status: DC

## 2013-02-11 MED ORDER — POTASSIUM CHLORIDE CRYS ER 20 MEQ PO TBCR: 20.0000 meq | EXTENDED_RELEASE_TABLET | Freq: Two times a day (BID) | ORAL | Status: DC

## 2013-02-11 NOTE — Progress Notes (Addendum)
D: pt. Stated today has been a very depressing day for her up until tonight. Pt. Stated she thought she was going to go home and she didn't, which upset her. Pt. Stated she feels like her depression is up and down and wants to know when she is going to get put on her depression medication. Pt. Stated she is terrified about the fact that her BP is so low, writer explained that certain withdrawal medication, and not drinking enough can cause the BP to drop. The writer also explained that the pt has been running a little low the entire time the pt has been on the unit, and that the pt hasn't complained of any lightheadedness, dizziness, or any other signs to show the the BP is effecting the fxn of the pt. The pt. Has been up at the nurses station quite a bit this evening wanting chocolate bars, asking for extra food trays, constantly asking for ginger ale and Gatorade. When the writer explains that the pt is supposed to use the fending machines during the time the pt is down in the cafeteria, the pt became upset and asked for another nurse. Writer offered pt. Water and medication to help with n/v, at first pt refused but later accepted. Pt stated she doesn't think the librium is working and really wants to take clonodine. Writer explained since pt BP is low, that medication would only make it worse. Denies SI/HI/AVH. Denies pain A: 1:1 time offered to pt. q 15 min safety checks. Orthostatic bp taken. Medications given. Extra snacks given R: pt. Remains safe on the unit. No complaints or signs of distress at this time.

## 2013-02-11 NOTE — [facility] Group Notes (Signed)
[facility] Group Notes:  (Nursing/MHT/Case Management/Adjunct)  Date:  02/11/2013  Time:  1:49 PM  Type of Therapy:  Psychoeducational Skills  Participation Level:  Did Not Attend    [name] 02/11/2013, 1:49 PM 

## 2013-02-11 NOTE — Progress Notes (Signed)
D: Patient denies SI/HI/AVH. Patient has a depressed mood with a flat affect.  She does appear anxious but is cooperative.  She reports poor sleeping and eating habits have been poor due to nausea, however she is eating some of her meal and requesting Ensure for supplement.     A: Patient given emotional support from RN. Patient encouraged to come to staff with concerns and/or questions. Patient's medication routine continued. Patient's orders and plan of care reviewed.   R: Patient remains appropriate and cooperative. Will continue to monitor patient q15 minutes for safety.  Pt.  In the dayroom interacting with others and acting appropriately within the millieu.

## 2013-02-11 NOTE — Progress Notes (Signed)
Adult Psychoeducational Group Note  Date:  02/11/2013 Time:  8:00 pm  Group Topic/Focus:  Wrap-Up Group:   The focus of this group is to help patients review their daily goal of treatment and discuss progress on daily workbooks.  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   Laural Benes, Mystery Schrupp 02/11/2013, 10:38 PM

## 2013-02-11 NOTE — Progress Notes (Signed)
Pt woke up complaining of having a nightmare that felt real. " it felt like they were holding me down, like evil spirits were around me, and i couldn't get up. i was so scary. i thought i was going to die. Patent attorney offered pt some water, and asked if pt. Wanted to sit in the dayroom for a while until she calmed down. Pt. Instead went back to room after getting water.

## 2013-02-11 NOTE — Progress Notes (Signed)
[facility] MD Progress Note  02/11/2013 12:28 PM Judy [name]  MRN:  [id number] Subjective:  Patient is alert and oriented, denies SI, HI, and hallucinations.   Verbalizing that she is not ready to leave today.  She tells me if she leaves today she would "use".  She hopes for rehabilitation on discharge.   Discussion regarding fluid intake, encouraged to alternate pitchers of water and sodas.  She agrees with that plan. Diagnosis:  Polysubstance abuse DSM5: Schizophrenia Disorders:  NA Obsessive-Compulsive Disorders:  NA Trauma-Stressor Disorders:   NASubstance/Addictive Disorders:  Alcohol Withdrawal (291.81), Alcohol and Opioid abuse Depressive Disorders: NA   Axis I: Alcohol Abuse and Substance Abuse  ADL's:  Intact  Sleep: Fair  Appetite:  Good  Suicidal Ideation: denies Homicidal Ideation: denies  AEB (as evidenced by):  Psychiatric Specialty Exam: Review of Systems  Constitutional: Negative.   HENT: Negative.   Eyes: Negative.   Respiratory: Negative.   Cardiovascular: Negative.   Gastrointestinal: Negative.   Genitourinary: Negative.   Musculoskeletal: Negative.   Skin: Negative.   Neurological: Negative.   Endo/Heme/Allergies: Negative.   Psychiatric/Behavioral: Positive for substance abuse (h/o polysubstance abuse). The patient is nervous/anxious and has insomnia (stable with medications).     Blood pressure 95/59, pulse 67, temperature 97.9 F (36.6 C), temperature source Oral, resp. rate 12, height 5\' 3"  (1.6 m), weight 54.885 kg (121 lb), last menstrual period 01/09/2013, SpO2 99.00%.Body mass index is 21.44 kg/(m^2).  General Appearance: Casual  Eye Contact::  Good  Speech:  Clear and Coherent  Volume:  Normal  Mood:  Euthymic  Affect:  Congruent  Thought Process:  Goal Directed  Orientation:  Full (Time, Place, and Person)  Thought Content:  Negative  Suicidal Thoughts:  No  Homicidal Thoughts:  No  Memory:  Negative Immediate;   Good  Judgement:   Fair  Insight:  Fair  Psychomotor Activity:  Normal and Decreased  Concentration:  Good  Recall:  Good  Akathisia:  No  Handed:  Right  AIMS (if indicated):     Assets:  Desire for Improvement  Sleep:  Number of Hours: 5.5   Current Medications: Current Facility-Administered Medications  Medication Dose Route Frequency Provider Last Rate Last Dose  . acetaminophen (TYLENOL) tablet 650 mg  650 mg Oral Q6H PRN Nanine Means, NP      . alum & mag hydroxide-simeth (MAALOX/MYLANTA) 200-200-20 MG/5ML suspension 30 mL  30 mL Oral Q4H PRN Nanine Means, NP      . chlordiazePOXIDE (LIBRIUM) capsule 25 mg  25 mg Oral Q6H PRN Nanine Means, NP      . chlordiazePOXIDE (LIBRIUM) capsule 25 mg  25 mg Oral TID Nanine Means, NP   25 mg at 02/11/13 0744   Followed by  . [START ON 02/12/2013] chlordiazePOXIDE (LIBRIUM) capsule 25 mg  25 mg Oral BH-qamhs Nanine Means, NP       Followed by  . [START ON 02/13/2013] chlordiazePOXIDE (LIBRIUM) capsule 25 mg  25 mg Oral Daily Nanine Means, NP      . dicyclomine (BENTYL) tablet 20 mg  20 mg Oral Q6H PRN Nanine Means, NP   20 mg at 02/09/13 1837  . feeding supplement (ENSURE COMPLETE) (ENSURE COMPLETE) liquid 237 mL  237 mL Oral BID BM Lavena Bullion, RD   237 mL at 02/11/13 0753  . hydrOXYzine (ATARAX/VISTARIL) tablet 25 mg  25 mg Oral Q6H PRN Nanine Means, NP      . loperamide (IMODIUM) capsule 2-4 mg  2-4  mg Oral PRN Nanine Means, NP      . loperamide (IMODIUM) capsule 2-4 mg  2-4 mg Oral PRN Nanine Means, NP      . magnesium hydroxide (MILK OF MAGNESIA) suspension 30 mL  30 mL Oral Daily PRN Nanine Means, NP      . methocarbamol (ROBAXIN) tablet 500 mg  500 mg Oral Q8H PRN Nanine Means, NP      . multivitamin with minerals tablet 1 tablet  1 tablet Oral Daily Nanine Means, NP   1 tablet at 02/11/13 0741  . naproxen (NAPROSYN) tablet 500 mg  500 mg Oral BID PRN Nanine Means, NP      . ondansetron (ZOFRAN-ODT) disintegrating tablet 4 mg  4 mg Oral Q6H PRN  Nanine Means, NP      . ondansetron (ZOFRAN-ODT) disintegrating tablet 4 mg  4 mg Oral Q6H PRN Nanine Means, NP   4 mg at 02/11/13 0751  . pantoprazole (PROTONIX) EC tablet 40 mg  40 mg Oral Daily Sanjuana Kava, NP   40 mg at 02/11/13 0739  . potassium chloride SA (K-DUR,KLOR-CON) CR tablet 20 mEq  20 mEq Oral BID Sanjuana Kava, NP   20 mEq at 02/11/13 0739  . thiamine (B-1) injection 100 mg  100 mg Intramuscular Once Nanine Means, NP      . thiamine (VITAMIN B-1) tablet 100 mg  100 mg Oral Daily Nanine Means, NP   100 mg at 02/11/13 0741  . traZODone (DESYREL) tablet 50 mg  50 mg Oral QHS PRN Nanine Means, NP   50 mg at 02/10/13 2126    Lab Results: No results found for this or any previous visit (from the past 48 hour(s)).  Physical Findings: AIMS:  , ,  ,  ,    CIWA:  CIWA-Ar Total: 2 COWS:  COWS Total Score: 5  Treatment Plan Summary: Review of chart, vital signs, medications and notes Daily contact with the patient to assess and evaluate synmptoms and progress in treatment   Plan: 1. Continue crisis management and stabilization.  2.  Medication management to reduce current symptoms to base line and improve patient's overall level of functioning 3.  Individual and group therapy encouraged 4. Coping skills for depression, substance abuse, and anxiety 5.  Develop treatment plan to decrease risk of relapse upon discharge and the need for readmission 6.  Psych-social education regarding relapse prevention and self care. 7.  Health care follow up as needed for medical problems 8.  Continue home medications where appropriate 9.  Disposition plan in progress    Medical Decision Making Problem Points:  Established problem, stable/improving (1) Data Points:  Review of medication regiment & side effects (2)  I certify that inpatient services furnished can reasonably be expected to improve the patient's condition.   Lorinda Creed    PMHNP  02/11/2013, 12:28 PM

## 2013-02-11 NOTE — [facility] Group Notes (Signed)
[facility] Group Notes:  (Clinical Social Work)  02/11/2013     10-11AM  Summary of Progress/Problems:   The main focus of today's process group was for the patient to identify ways in which they have in the past sabotaged their own recovery. Motivational Interviewing was utilized to ask the group members what they get out of their substance use, and what reasons they may have for wanting to change.  The Stages of Change were explained using a handout, and patients identified where they currently are with regard to stages of change.  The patient expressed that yesterday she wanted to leave the hospital because the voice in her head was the "devil telling me to go get high."  She stated she does not like to feel things, that she prefers to stay numb and that is why she uses alcohol and drugs.  She also likes who she becomes when she drinks.  She was extremely intrusive throughout group with little ability to be redirected, blurting out questions or observations unrelated to group as soon as she thought of them.  She states she is in the Contemplation Stage of Change  Type of Therapy:  Group Therapy - Process   Participation Level:  Active  Participation Quality:  Intrusive, Monopolizing and Sharing  Affect:  Blunted  Cognitive:  Oriented  Insight:  Limited  Engagement in Therapy:  Developing/Improving  Modes of Intervention:  Education, Support and Processing, Motivational Interviewing  Ambrose Mantle, LCSW 02/11/2013, 12:42 PM

## 2013-02-12 MED ORDER — PROMETHAZINE HCL 25 MG RE SUPP
RECTAL | Status: AC
  Administered 2013-02-12: 25 mg via RECTAL
  Filled 2013-02-12: qty 1

## 2013-02-12 MED ORDER — PROMETHAZINE HCL 25 MG RE SUPP
25.0000 mg | Freq: Once | RECTAL | Status: AC
  Administered 2013-02-12: 25 mg via RECTAL
  Filled 2013-02-12: qty 1

## 2013-02-12 NOTE — Progress Notes (Signed)
D: pt is sitting in room reading a magazine. Pt stated that today was okay. Pt stated she is ready to go home. Pt asked for a food tray, pt stated " i have been throwing up all day, and i'm hungry." Denies SI/HI/AVH. Denies having any pain. Pt. Is calm and cooperative, keeping to self tonight more so than usual. A: food tray given to pt. q 15 min safety checks. 1:1 time given. Support and encouragement offered R: pt remains safe on the unit. No distress or complaints at this time.

## 2013-02-12 NOTE — Progress Notes (Signed)
Patient ID: Judy [name], female   DOB: [birth year], 32 y.o.   MRN: [id number] [facility] MD Progress Note  02/12/2013 3:19 PM Judy [name]  MRN:  [id number]  Subjective: Judy [name] reports, "I want to go home. I'm not doing well. I keep throwing up. This is what happens to me. It has been going on all my life. I have been to the GI doctors, they could not help me. Drugs did this to me.  What is the sense of being in this hospital when I could go use as soon I get discharged from here.  Diagnosis:  Polysubstance abuse DSM5: Schizophrenia Disorders:  NA Obsessive-Compulsive Disorders:  NA Trauma-Stressor Disorders:   NASubstance/Addictive Disorders:  Alcohol Withdrawal (291.81), Alcohol and Opioid abuse Depressive Disorders: NA   Axis I: Alcohol Abuse and Substance Abuse  ADL's:  Intact  Sleep: Fair  Appetite:  Good  Suicidal Ideation:  Denies  Homicidal Ideation:  Denies  AEB (as evidenced by):  Psychiatric Specialty Exam: Review of Systems  Constitutional: Negative.   HENT: Negative.   Eyes: Negative.   Respiratory: Negative.   Cardiovascular: Negative.   Gastrointestinal: Negative.   Genitourinary: Negative.   Musculoskeletal: Negative.   Skin: Negative.   Neurological: Negative.   Endo/Heme/Allergies: Negative.   Psychiatric/Behavioral: Positive for substance abuse (h/o polysubstance abuse). The patient is nervous/anxious and has insomnia (stable with medications).     Blood pressure 99/64, pulse 87, temperature 98.8 F (37.1 C), temperature source Oral, resp. rate 16, height 5\' 3"  (1.6 m), weight 54.885 kg (121 lb), last menstrual period 01/09/2013, SpO2 99.00%.Body mass index is 21.44 kg/(m^2).  General Appearance: Casual  Eye Contact::  Good  Speech:  Clear and Coherent  Volume:  Normal  Mood:  Irritated, angry, demanding  Affect:  Congruent  Thought Process:  Disorganized  Orientation:  Full (Time, Place, and Person)  Thought Content:  Cravings  Suicidal  Thoughts:  No  Homicidal Thoughts:  No  Memory:  Negative Immediate;   Good  Judgement:  Fair  Insight:  Fair  Psychomotor Activity:  Restlessness  Concentration:  Poor  Recall:  Good  Akathisia:  No  Handed:  Right  AIMS (if indicated):     Assets:  Desire for Improvement  Sleep:  Number of Hours: 6.25   Current Medications: Current Facility-Administered Medications  Medication Dose Route Frequency Provider Last Rate Last Dose  . acetaminophen (TYLENOL) tablet 650 mg  650 mg Oral Q6H PRN Nanine Means, NP   650 mg at 02/12/13 1109  . alum & mag hydroxide-simeth (MAALOX/MYLANTA) 200-200-20 MG/5ML suspension 30 mL  30 mL Oral Q4H PRN Nanine Means, NP      . chlordiazePOXIDE (LIBRIUM) capsule 25 mg  25 mg Oral Q6H PRN Nanine Means, NP   25 mg at 02/12/13 1257  . chlordiazePOXIDE (LIBRIUM) capsule 25 mg  25 mg Oral BH-qamhs Nanine Means, NP       Followed by  . [START ON 02/13/2013] chlordiazePOXIDE (LIBRIUM) capsule 25 mg  25 mg Oral Daily Nanine Means, NP      . dicyclomine (BENTYL) tablet 20 mg  20 mg Oral Q6H PRN Nanine Means, NP   20 mg at 02/09/13 1837  . feeding supplement (ENSURE COMPLETE) (ENSURE COMPLETE) liquid 237 mL  237 mL Oral BID BM Lavena Bullion, RD   237 mL at 02/11/13 1953  . hydrOXYzine (ATARAX/VISTARIL) tablet 25 mg  25 mg Oral Q6H PRN Nanine Means, NP   25 mg at 02/11/13 2148  .  loperamide (IMODIUM) capsule 2-4 mg  2-4 mg Oral PRN Nanine Means, NP      . loperamide (IMODIUM) capsule 2-4 mg  2-4 mg Oral PRN Nanine Means, NP      . magnesium hydroxide (MILK OF MAGNESIA) suspension 30 mL  30 mL Oral Daily PRN Nanine Means, NP      . methocarbamol (ROBAXIN) tablet 500 mg  500 mg Oral Q8H PRN Nanine Means, NP      . multivitamin with minerals tablet 1 tablet  1 tablet Oral Daily Nanine Means, NP   1 tablet at 02/11/13 0741  . naproxen (NAPROSYN) tablet 500 mg  500 mg Oral BID PRN Nanine Means, NP      . ondansetron (ZOFRAN-ODT) disintegrating tablet 4 mg  4 mg Oral Q6H  PRN Nanine Means, NP      . ondansetron (ZOFRAN-ODT) disintegrating tablet 4 mg  4 mg Oral Q6H PRN Nanine Means, NP   4 mg at 02/11/13 2046  . pantoprazole (PROTONIX) EC tablet 40 mg  40 mg Oral Daily Sanjuana Kava, NP   40 mg at 02/11/13 0739  . potassium chloride SA (K-DUR,KLOR-CON) CR tablet 20 mEq  20 mEq Oral BID Sanjuana Kava, NP   20 mEq at 02/11/13 0739  . thiamine (B-1) injection 100 mg  100 mg Intramuscular Once Nanine Means, NP      . thiamine (VITAMIN B-1) tablet 100 mg  100 mg Oral Daily Nanine Means, NP   100 mg at 02/11/13 0741  . traZODone (DESYREL) tablet 50 mg  50 mg Oral QHS PRN Nanine Means, NP   50 mg at 02/11/13 2148    Lab Results: No results found for this or any previous visit (from the past 48 hour(s)).  Physical Findings: AIMS:  , ,  ,  ,    CIWA:  CIWA-Ar Total: 3 COWS:  COWS Total Score: 5  Treatment Plan Summary: Review of chart, vital signs, medications and notes Daily contact with the patient to assess and evaluate synmptoms and progress in treatment  Plan:Supportive approach/coping skills/relapse prevention. Phenegan 25 mg supp x 1 for nausea/vomiting Encouraged out of room, participation in group sessions and application of coping skills when distressed. Will continue to monitor response to/adverse effects of medications in use to assure effectiveness. Continue to monitor mood, behavior and interaction with staff and other patients. Continue current plan of care.  Medical Decision Making Problem Points:  Established problem, stable/improving (1) Data Points:  Review of medication regiment & side effects (2)  I certify that inpatient services furnished can reasonably be expected to improve the patient's condition.   Armandina Stammer I    PMHNP  02/12/2013, 3:19 PM

## 2013-02-12 NOTE — Progress Notes (Signed)
[facility] Group Notes:  (Nursing/MHT/Case Management/Adjunct)  Date:  02/12/2013  Time:  8:00 p.m.   Type of Therapy:  Psychoeducational Skills  Participation Level:  Active  Participation Quality:  Monopolizing  Affect:  Excited  Cognitive:  Appropriate  Insight:  Improving  Engagement in Group:  Engaged  Modes of Intervention:  Education  Summary of Progress/Problems: The patient described her day as having been "difficult". She explained that she has been experiencing bad withdrawal symptoms. She has been craving drugs and is trying not to get discharged too soon. As a theme for the day, she mentioned that her family and boyfriend are her support system.   Hazle Coca S 02/12/2013, 10:56 PM

## 2013-02-12 NOTE — [facility] Group Notes (Signed)
[facility] Group Notes: (Clinical Social Work)   02/12/2013      Type of Therapy:  Group Therapy   Participation Level:  Did Not Attend    Ambrose Mantle, LCSW 02/12/2013, 12:56 PM

## 2013-02-12 NOTE — [facility] Group Notes (Signed)
[facility] Group Notes:  (Nursing/MHT/Case Management/Adjunct)  Date:  02/12/2013  Time:  1315  Type of Therapy:  Nurse Education  Participation Level:  Minimal  Participation Quality:     Affect:    Cognitive:    Insight:    Engagement in Group:    Modes of Intervention:    Summary of Progress/Problems:Left group within a few minutes  Purva Vessell G 02/12/2013, 1:50 PM

## 2013-02-12 NOTE — Progress Notes (Signed)
D- Patient is c/o nausea with severe vomiting.  Also c/o HA.  A- Held am meds and instructed to take sips of fluids or ice chips only.  Phenergan suppository one time dose ordered and administered.  A- Cold cloth to forehead. Support offered. R- Continue to assess.

## 2013-02-12 NOTE — Progress Notes (Signed)
Adult Psychoeducational Group Note  Date:  02/12/2013 Time:  3:52 PM  Group Topic/Focus:  Therapeutic Activity  Participation Level:  Did Not Attend   Arlie Riker N 02/12/2013, 3:52 PM 

## 2013-02-13 DIAGNOSIS — F1020 Alcohol dependence, uncomplicated: Principal | ICD-10-CM

## 2013-02-13 DIAGNOSIS — F1420 Cocaine dependence, uncomplicated: Secondary | ICD-10-CM

## 2013-02-13 MED ORDER — TRAZODONE HCL 50 MG PO TABS: 50.0000 mg | ORAL_TABLET | Freq: Every evening | ORAL | Status: DC | PRN

## 2013-02-13 MED ORDER — POTASSIUM CHLORIDE CRYS ER 20 MEQ PO TBCR: 20.0000 meq | EXTENDED_RELEASE_TABLET | Freq: Two times a day (BID) | ORAL | Status: DC

## 2013-02-13 MED ORDER — PANTOPRAZOLE SODIUM 40 MG PO TBEC: 40.0000 mg | DELAYED_RELEASE_TABLET | Freq: Every day | ORAL | Status: DC

## 2013-02-13 NOTE — [facility] Group Notes (Signed)
[facility] LCSW Aftercare Discharge Planning Group Note   02/13/2013 9:32 AM  Participation Quality:  Appropriate   Mood/Affect:  Tearful  Depression Rating:  2  Anxiety Rating:  0  Thoughts of Suicide:  No Will you contract for safety?   NA  Current AVH:  No  Plan for Discharge/Comments:  Pt stated that she had "a change of heart" and decided that she is interested in [facility] referral. Pt stated that she was supposed to d/c this weekend but suffered severe withdrawals. In that time, pt decided that she wanted i/p treatment. CSW called [facility] and sent referral. Bed availability a possibility for today. [facility] for med management if pt agrees to sign release.   Transportation Means: boyfriend   Supports: boyfriend   Counselling psychologist, Oncologist

## 2013-02-13 NOTE — [facility] Suicide Risk Assessment (Signed)
Suicide Risk Assessment  Discharge Assessment     Demographic Factors:  Unemployed  Mental Status Per Nursing Assessment::   On Admission:   (Denies)  Current Mental Status by Physician: In full contact with reality. There are no suicidal ideas, plans or intent. No acute withdrawal. Her mood is euthymic, her affect is appropriate. She expressed readiness to the work it would take to stay sober. She will be going to [facility] this afternoon.   Loss Factors: NA  Historical Factors: NA  Risk Reduction Factors:   Positive social support  Continued Clinical Symptoms:  Alcohol/Substance Abuse/Dependencies  Cognitive Features That Contribute To Risk:  Closed-mindedness Polarized thinking Thought constriction (tunnel vision)    Suicide Risk:  Minimal: No identifiable suicidal ideation.  Patients presenting with no risk factors but with morbid ruminations; may be classified as minimal risk based on the severity of the depressive symptoms  Discharge Diagnoses:   AXIS I:  Alcohol, Cocaine Dependence, Substance Induced Mood Disorder AXIS II:  Deferred AXIS III:   Past Medical History  Diagnosis Date  . Depression    AXIS IV:  other psychosocial or environmental problems AXIS V:  51-60 moderate symptoms  Plan Of Care/Follow-up recommendations:  Activity:  as tolerated Diet:  regular Follow up [facility] Is patient on multiple antipsychotic therapies at discharge:  No   Has Patient had three or more failed trials of antipsychotic monotherapy by history:  No  Recommended Plan for Multiple Antipsychotic Therapies: NA  Noell Lorensen A 02/13/2013, 12:25 PM

## 2013-02-13 NOTE — Progress Notes (Signed)
[facility] Adult Case Management Discharge Plan :  Will you be returning to the same living situation after discharge: No. Pt accepted into [facility] At discharge, do you have transportation home?:Yes,  bf taking her to [facility] at d/c. Do you have the ability to pay for your medications:Yes,  Medicaid  Release of information consent forms completed and in the chart;  Patient's signature needed at discharge.  Patient to Follow up at: Follow-up Information   Follow up with [facility]. (Arrive at [facility] by 2PM for admission. )    Contact information:   [address] [place], [place] [address] Phone: [phone] Fax: [phone]      Patient denies SI/HI:   Yes,  during group/self report.    Safety Planning and Suicide Prevention discussed:  Yes,  SPE not required for this pt as she did not endorse SI during admission or during stay at [facility]. SPI pamphlet provided to pt and she was encouraged to share this information with her support network, ask questions, and talk about any concerns.   Smart, Canaan Prue LCSWA  02/13/2013, 12:36 PM

## 2013-02-13 NOTE — Progress Notes (Signed)
Adult Psychoeducational Group Note  Date:  02/13/2013 Time:  6:56 PM  Group Topic/Focus:  Self Care:   The focus of this group is to help patients understand the importance of self-care in order to improve or restore emotional, physical, spiritual, interpersonal, and financial health.  Participation Level:  Minimal  Participation Quality:  Inattentive and Redirectable  Affect:  Appropriate  Cognitive:  Lacking  Insight: Improving  Engagement in Group:  Developing/Improving  Modes of Intervention:  Activity, Discussion, Education, Socialization and Support  Additional Comments: Judy [name] attended group and was disrupted at times, due to laughter outburst. Patient redirected and asked to work in self-care assessment. Patient followed along with and shared at times with strengths and weaknesses of care in relationship, psychological, physical, spiritual, emotional.  Judy [name] 02/13/2013, 6:56 PM
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
## 2013-02-13 NOTE — [facility] Group Notes (Signed)
[facility] Group Notes:  (Nursing/MHT/Case Management/Adjunct)  Date:  02/13/2013  Time:  11:36 AM  Type of Therapy:  Therapeutic Activity  Participation Level:  Active  Participation Quality:  Appropriate, Attentive, Sharing and Supportive  Affect:  Appropriate  Cognitive:  Alert and Appropriate  Insight:  Appropriate and Good  Engagement in Group:  Developing/Improving, Engaged and Supportive  Modes of Intervention:  Activity and Rapport Building  Summary of Progress/Problems:  Pt was bright and engaged in the therapeutic activity, attentive while others shared and took on a supportive role towards other pts, pt showed good insight when talking about what she has learned while being here at [facility].  Alfonse Spruce 02/13/2013, 11:36 AM

## 2013-02-13 NOTE — Progress Notes (Signed)
D/C instructions/meds/follow-up appointments reviewed, pt verbalized understanding, pt's belongings returned to pt, samples given. 

## 2013-02-15 NOTE — Progress Notes (Signed)
Patient Discharge Instructions:  After Visit Summary (AVS):   Faxed to:  02/15/13 Discharge Summary Note:   Faxed to:  02/15/13 Psychiatric Admission Assessment Note:   Faxed to:  02/15/13 Suicide Risk Assessment - Discharge Assessment:   Faxed to:  02/15/13 Faxed/Sent to the Next Level Care provider:  02/15/13 Faxed to [facility] @ [phone]  Jerelene Redden, 02/15/2013, 2:21 PM

## 2013-03-02 ENCOUNTER — Encounter (HOSPITAL_COMMUNITY): Payer: Self-pay | Admitting: Emergency Medicine

## 2013-03-02 ENCOUNTER — Emergency Department (HOSPITAL_COMMUNITY)
Admission: EM | Admit: 2013-03-02 | Discharge: 2013-03-02 | Discharge status: Against Medical Advice | Payer: MEDICAID | Attending: Emergency Medicine | Admitting: Emergency Medicine

## 2013-03-02 ENCOUNTER — Emergency Department (HOSPITAL_COMMUNITY)
Admission: EM | Admit: 2013-03-02 | Discharge: 2013-03-03 | Disposition: A | Discharge status: Home / Self Care | Payer: MEDICAID | Attending: Emergency Medicine | Admitting: Emergency Medicine

## 2013-03-02 DIAGNOSIS — F17200 Nicotine dependence, unspecified, uncomplicated: Secondary | ICD-10-CM | POA: Insufficient documentation

## 2013-03-02 DIAGNOSIS — Z880 Allergy status to penicillin: Secondary | ICD-10-CM | POA: Insufficient documentation

## 2013-03-02 DIAGNOSIS — F1020 Alcohol dependence, uncomplicated: Secondary | ICD-10-CM | POA: Insufficient documentation

## 2013-03-02 DIAGNOSIS — F411 Generalized anxiety disorder: Secondary | ICD-10-CM | POA: Insufficient documentation

## 2013-03-02 DIAGNOSIS — F1410 Cocaine abuse, uncomplicated: Secondary | ICD-10-CM | POA: Insufficient documentation

## 2013-03-02 DIAGNOSIS — F909 Attention-deficit hyperactivity disorder, unspecified type: Secondary | ICD-10-CM | POA: Insufficient documentation

## 2013-03-02 DIAGNOSIS — R112 Nausea with vomiting, unspecified: Secondary | ICD-10-CM | POA: Insufficient documentation

## 2013-03-02 DIAGNOSIS — Z79899 Other long term (current) drug therapy: Secondary | ICD-10-CM | POA: Insufficient documentation

## 2013-03-02 DIAGNOSIS — R300 Dysuria: Secondary | ICD-10-CM | POA: Insufficient documentation

## 2013-03-02 HISTORY — DX: Cocaine abuse, uncomplicated: F14.10

## 2013-03-02 HISTORY — DX: Anxiety disorder, unspecified: F41.9

## 2013-03-02 HISTORY — DX: Other psychoactive substance abuse, uncomplicated: F19.10

## 2013-03-02 LAB — URINALYSIS, ROUTINE W REFLEX MICROSCOPIC
Bilirubin Urine: NEGATIVE
Glucose, UA: NEGATIVE mg/dL
Ketones, ur: 15 mg/dL — AB
Nitrite: NEGATIVE
Urobilinogen, UA: 1 mg/dL (ref 0.0–1.0)
pH: 8 (ref 5.0–8.0)

## 2013-03-02 LAB — COMPREHENSIVE METABOLIC PANEL
ALT: 13 U/L (ref 0–35)
Alkaline Phosphatase: 77 U/L (ref 39–117)
BUN: 10 mg/dL (ref 6–23)
CO2: 29 mEq/L (ref 19–32)
Calcium: 9.6 mg/dL (ref 8.4–10.5)
Creatinine, Ser: 1.06 mg/dL (ref 0.50–1.10)
GFR calc Af Amer: 79 mL/min — ABNORMAL LOW (ref >90)
GFR calc non Af Amer: 68 mL/min — ABNORMAL LOW (ref >90)
Glucose, Bld: 107 mg/dL — ABNORMAL HIGH (ref 70–99)
Potassium: 2.3 mEq/L — CL (ref 3.5–5.1)
Sodium: 142 mEq/L (ref 135–145)
Total Protein: 7.4 g/dL (ref 6.0–8.3)

## 2013-03-02 LAB — CBC WITH DIFFERENTIAL/PLATELET
Eosinophils Relative: 3 % (ref 0–5)
Hemoglobin: 15.6 g/dL — ABNORMAL HIGH (ref 12.0–15.0)
Lymphocytes Relative: 61 % — ABNORMAL HIGH (ref 12–46)
Lymphs Abs: 3.9 10*3/uL (ref 0.7–4.0)
MCV: 90.6 fL (ref 78.0–100.0)
Monocytes Relative: 9 % (ref 3–12)
Platelets: 230 10*3/uL (ref 150–400)
RBC: 4.77 MIL/uL (ref 3.87–5.11)
WBC: 6.5 10*3/uL (ref 4.0–10.5)

## 2013-03-02 LAB — RAPID URINE DRUG SCREEN, HOSP PERFORMED
Amphetamines: NOT DETECTED
Barbiturates: NOT DETECTED
Opiates: NOT DETECTED

## 2013-03-02 LAB — ETHANOL: Alcohol, Ethyl (B): 102 mg/dL — ABNORMAL HIGH (ref 0–11)

## 2013-03-02 LAB — URINE MICROSCOPIC-ADD ON

## 2013-03-02 MED ORDER — SULFAMETHOXAZOLE-TMP DS 800-160 MG PO TABS
1.0000 | ORAL_TABLET | Freq: Two times a day (BID) | ORAL | Status: DC
  Administered 2013-03-02 – 2013-03-03 (×2): 1 via ORAL
  Filled 2013-03-02 (×2): qty 1

## 2013-03-02 MED ORDER — METOCLOPRAMIDE HCL 5 MG/ML IJ SOLN: 10.0000 mg | Freq: Once | INTRAMUSCULAR | Status: DC

## 2013-03-02 MED ORDER — PROMETHAZINE HCL 25 MG/ML IJ SOLN
25.0000 mg | Freq: Once | INTRAMUSCULAR | Status: AC
  Administered 2013-03-02: 25 mg via INTRAVENOUS
  Filled 2013-03-02: qty 1

## 2013-03-02 MED ORDER — POTASSIUM CHLORIDE CRYS ER 20 MEQ PO TBCR
20.0000 meq | EXTENDED_RELEASE_TABLET | Freq: Two times a day (BID) | ORAL | Status: DC
  Administered 2013-03-02 – 2013-03-03 (×2): 20 meq via ORAL
  Filled 2013-03-02 (×3): qty 1

## 2013-03-02 MED ORDER — POTASSIUM CHLORIDE 10 MEQ/100ML IV SOLN
10.0000 meq | Freq: Once | INTRAVENOUS | Status: AC
  Administered 2013-03-02: 10 meq via INTRAVENOUS
  Filled 2013-03-02: qty 100

## 2013-03-02 MED ORDER — THIAMINE HCL 100 MG/ML IJ SOLN: 100.0000 mg | Freq: Every day | INTRAMUSCULAR | Status: DC

## 2013-03-02 MED ORDER — PANTOPRAZOLE SODIUM 40 MG PO TBEC
40.0000 mg | DELAYED_RELEASE_TABLET | Freq: Every day | ORAL | Status: DC
  Administered 2013-03-02 – 2013-03-03 (×2): 40 mg via ORAL
  Filled 2013-03-02 (×2): qty 1

## 2013-03-02 MED ORDER — POTASSIUM CHLORIDE CRYS ER 20 MEQ PO TBCR
40.0000 meq | EXTENDED_RELEASE_TABLET | Freq: Once | ORAL | Status: AC
  Administered 2013-03-02: 40 meq via ORAL
  Filled 2013-03-02: qty 2

## 2013-03-02 MED ORDER — SODIUM CHLORIDE 0.9 % IV BOLUS (SEPSIS)
1000.0000 mL | Freq: Once | INTRAVENOUS | Status: AC
  Administered 2013-03-02: 1000 mL via INTRAVENOUS

## 2013-03-02 MED ORDER — VITAMIN B-1 100 MG PO TABS
100.0000 mg | ORAL_TABLET | Freq: Every day | ORAL | Status: DC
  Administered 2013-03-03: 10:00:00 100 mg via ORAL
  Filled 2013-03-02: qty 1

## 2013-03-02 MED ORDER — LORAZEPAM 1 MG PO TABS: 0.0000 mg | ORAL_TABLET | Freq: Two times a day (BID) | ORAL | Status: DC

## 2013-03-02 MED ORDER — LORAZEPAM 1 MG PO TABS
0.0000 mg | ORAL_TABLET | Freq: Four times a day (QID) | ORAL | Status: DC
  Filled 2013-03-02: qty 1

## 2013-03-02 NOTE — ED Notes (Signed)
Unable to locate pt. at triage and waiting area several times by staff.  

## 2013-03-02 NOTE — ED Notes (Signed)
Unable to locate pt for triage x 1

## 2013-03-02 NOTE — ED Notes (Signed)
Pt. requesting Detox for Cocaine and alcohol abuse last alcohol and Cocaine today . Denies suicidal ideation .

## 2013-03-02 NOTE — ED Notes (Signed)
Patient was called for triage again and she did not answer.

## 2013-03-02 NOTE — ED Notes (Signed)
CRITICAL VALUE ALERT  Critical value received:  Potassium 2.3  Date of notification:  03/02/2013  Time of notification:  2142  Critical value read back:yes  Nurse who received alert:  MG Jenelle Mages, RN  MD notified (1st page):  Dr. Jeraldine Loots  Time of first page:  2141  MD notified (2nd page):  Time of second page:  Responding MD:  Dr. Jeraldine Loots  Time MD responded:  2141

## 2013-03-02 NOTE — ED Provider Notes (Signed)
CSN: [id number]     Arrival date & time 03/02/13  2022 History   First MD Initiated Contact with Patient 03/02/13 2149     Chief Complaint  Patient presents with  . Drug Problem  . Alcohol Problem    HPI  Patient presents with request for assistance with detoxification from alcohol and cocaine use.  The patient notes that she relapsed within the past weeks, has been using substances substantially, including today. She denies new other illicit substance abuse. She states that also, today, she developed nausea, vomiting. She has no diarrhea.  She also c/o generalized sense of discomfort, but no new abdominal pain, now with no headache, no new chest pain, no new dyspnea. She has ongoing dysuria as well. Patient notes a history of hypokalemia, with no medication compliance.   Past Medical History  Diagnosis Date  . Depression   . Cocaine abuse   . Polysubstance abuse   . Anxiety   . Depression    History reviewed. No pertinent past surgical history. No family history on file. History  Substance Use Topics  . Smoking status: Current Every Day Smoker -- 0.25 packs/day for 10 years    Types: Cigarettes  . Smokeless tobacco: Not on file  . Alcohol Use: Yes   OB History   Grav Para Term Preterm Abortions TAB SAB Ect Mult Living                 Review of Systems  Constitutional:       Per HPI, otherwise negative  HENT:       Per HPI, otherwise negative  Respiratory:       Per HPI, otherwise negative  Cardiovascular:       Per HPI, otherwise negative  Gastrointestinal: Negative for vomiting.  Endocrine:       Negative aside from HPI  Genitourinary:       Neg aside from HPI   Musculoskeletal:       Per HPI, otherwise negative  Skin: Negative.   Neurological: Negative for syncope.  Psychiatric/Behavioral: Negative for suicidal ideas. The patient is nervous/anxious and is hyperactive.     Allergies  Penicillins and Zofran  Home Medications   Current Outpatient Rx   Name  Route  Sig  Dispense  Refill  . pantoprazole (PROTONIX) 40 MG tablet   Oral   Take 1 tablet (40 mg total) by mouth daily. For GERD.         Marland Kitchen potassium chloride SA (K-DUR,KLOR-CON) 20 MEQ tablet   Oral   Take 1 tablet (20 mEq total) by mouth 2 (two) times daily. For hypokalemia          BP 103/78  Pulse 73  Temp(Src) 98.4 F (36.9 C) (Oral)  Resp 25  Wt 111 lb 8 oz (50.576 kg)  SpO2 100%  LMP 02/10/2013 Physical Exam  Nursing note and vitals reviewed. Constitutional: She is oriented to person, place, and time. She appears well-developed and well-nourished. No distress.  HENT:  Head: Normocephalic and atraumatic.  Eyes: Conjunctivae and EOM are normal.  Cardiovascular: Normal rate and regular rhythm.   Pulmonary/Chest: Effort normal and breath sounds normal. No stridor. No respiratory distress.  Abdominal: She exhibits no distension. There is no hepatosplenomegaly. There is tenderness in the suprapubic area. There is no rigidity, no rebound, no guarding and no CVA tenderness.  Musculoskeletal: She exhibits no edema.  Neurological: She is alert and oriented to person, place, and time. No cranial nerve deficit.  Skin: Skin is warm and dry.  Psychiatric: Her speech is normal and behavior is normal. Judgment normal. Her mood appears anxious. Cognition and memory are not impaired. She expresses no suicidal ideation.    ED Course  Procedures (including critical care time) Labs Review Labs Reviewed  ETHANOL - Abnormal; Notable for the following:    Alcohol, Ethyl (B) 102 (*)    All other components within normal limits  URINE RAPID DRUG SCREEN (HOSP PERFORMED) - Abnormal; Notable for the following:    Cocaine POSITIVE (*)    All other components within normal limits  CBC WITH DIFFERENTIAL - Abnormal; Notable for the following:    Hemoglobin 15.6 (*)    MCHC 36.1 (*)    Neutrophils Relative % 27 (*)    Lymphocytes Relative 61 (*)    All other components within  normal limits  COMPREHENSIVE METABOLIC PANEL - Abnormal; Notable for the following:    Potassium 2.3 (*)    Glucose, Bld 107 (*)    GFR calc non Af Amer 68 (*)    GFR calc Af Amer 79 (*)    All other components within normal limits  URINALYSIS, ROUTINE W REFLEX MICROSCOPIC - Abnormal; Notable for the following:    APPearance CLOUDY (*)    Ketones, ur 15 (*)    Protein, ur 100 (*)    Leukocytes, UA SMALL (*)    All other components within normal limits  URINE MICROSCOPIC-ADD ON - Abnormal; Notable for the following:    Squamous Epithelial / LPF FEW (*)    Bacteria, UA MANY (*)    All other components within normal limits  URINE CULTURE   Imaging Review No results found.  EKG Interpretation   None      After initial evaluation I reviewed the patient's chart.  Labs notable for hypokalemia.  The patient has a history of hypokalemia, she received IV fluid replenishment, oral replenishment.  On repeat exam the patient appears stable. Patient's UA is consistent with possible UTI, though she is likely dehydrated as well.  She received IV fluids, will receive antibiotics as well. MDM  No diagnosis found. This patient presents with request for assistance with substance abuse.  On exam she is awake and alert, uncomfortable, but in no distress.  Patient has had vomiting as well, likely contributed to her hypokalemia, though she has this is a previously documented medical problem. Patient received IV fluids, potassium supplement, remained hemodynamically stable, in no distress throughout her emergency department course.  After initiation of treatment with antibiotics for possible UTI, she was medically clear for further psychiatric evaluation.  Patient remained in the acute care side of the emergency department for a prolonged time given the absencein the psychiatric holding area    Gerhard Munch, MD 03/02/13 2336

## 2013-03-02 NOTE — ED Notes (Signed)
Only 3/4 of phenergan administered due to pt c.o of medication burning.

## 2013-03-03 LAB — BASIC METABOLIC PANEL
BUN: 10 mg/dL (ref 6–23)
Calcium: 8.1 mg/dL — ABNORMAL LOW (ref 8.4–10.5)
Creatinine, Ser: 1 mg/dL (ref 0.50–1.10)
GFR calc Af Amer: 85 mL/min — ABNORMAL LOW (ref >90)
GFR calc non Af Amer: 73 mL/min — ABNORMAL LOW (ref >90)
Glucose, Bld: 134 mg/dL — ABNORMAL HIGH (ref 70–99)
Potassium: 2.9 mEq/L — ABNORMAL LOW (ref 3.5–5.1)

## 2013-03-03 MED ORDER — MAGNESIUM SULFATE 40 MG/ML IJ SOLN
2.0000 g | Freq: Once | INTRAMUSCULAR | Status: AC
  Administered 2013-03-03: 2 g via INTRAVENOUS
  Filled 2013-03-03: qty 50

## 2013-03-03 MED ORDER — POTASSIUM CHLORIDE 20 MEQ/15ML (10%) PO LIQD
40.0000 meq | Freq: Once | ORAL | Status: AC
  Administered 2013-03-03: 40 meq via ORAL
  Filled 2013-03-03: qty 30

## 2013-03-03 NOTE — Progress Notes (Signed)
Judy [name], MHT received report from [place] at [facility] with potential acceptance which is pending authorization with [facility]. [name] states that she will complete authorization. Writer informed attending nurse Drinda Butts

## 2013-03-03 NOTE — ED Notes (Signed)
[facility] [phone] inquired about placement.
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
## 2013-03-03 NOTE — ED Notes (Signed)
Breakfast tray ordered 

## 2013-03-03 NOTE — ED Notes (Signed)
[facility] DID RECEIVE PT LABS. THEY ARE VERIFYING PT MEDICAID AND WILL CALL BACK

## 2013-03-03 NOTE — ED Notes (Signed)
UPDATED LABS FAXED TO [facility] IN [place]
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
## 2013-03-03 NOTE — ED Notes (Signed)
Pt wanded by security. 

## 2013-03-03 NOTE — ED Notes (Signed)
Pt asked to have [place] and Ginger Ale to eat, asked if she would want a sandwich, pt denied.  Pt was given the [employer], peanut butter and Ginger Ale and the pt states "you are not treating me like the night nurse".  I did not bring excess food in to the patients room until she finished was was given.

## 2013-03-03 NOTE — Progress Notes (Signed)
B.Shirely Toren, MHT requested to seek a detox facility for patient who is in need for substance abuse treatment. Writer spoke with patients attending nurse about medical stability. Nurse reports that patient is currently being given IV fluids and potassium. Patient current potassium level as of 5am was at 2.9. Patient states that her potassium generally runs low and that she takes potassium pills from over the counter. Writer seeking placement at this time to [facility], [facility] does not accept Medicaid for detox, patient to be run by [facility] as well.

## 2013-03-03 NOTE — Progress Notes (Signed)
[facility]: Female bed available Faxed Referral. [facility]: [place] stated at capacity but still reviewing referrals for discharges. [facility]:No Beds available [facility] No beds available [facility]: No beds available, accepting referrals for possible discharges  Cassie Shedlock Leoti, MHT

## 2013-03-03 NOTE — ED Notes (Signed)
telepsych monitor placed in room

## 2013-03-04 LAB — URINE CULTURE

## 2013-03-05 ENCOUNTER — Telehealth (HOSPITAL_COMMUNITY): Payer: Self-pay | Admitting: Emergency Medicine

## 2013-03-05 NOTE — ED Notes (Signed)
Post ED Visit - Positive Culture Follow-up: Successful Patient Follow-Up  Culture assessed and recommendations reviewed by: []  Wes Dulaney, Pharm.D., BCPS []  Celedonio Miyamoto, Pharm.D., BCPS []  Georgina Pillion, Pharm.D., BCPS []  [place], [place].D., BCPS, AAHIVP []  Estella Husk, Pharm.D., BCPS, AAHIVP [x]  Louie Casa, [address].D., BCPS  Positive urine culture  [x]  Patient discharged without antimicrobial prescription and treatment is now indicated []  Organism is resistant to prescribed ED discharge antimicrobial []  Patient with positive blood cultures  Changes discussed with ED provider: Irish Elders PA-C New antibiotic prescription: Cipro 500 mg PO BID x 3 days,    Kylie A Holland 03/05/2013, 4:09 PM

## 2013-03-09 NOTE — ED Notes (Signed)
12/18-letter sent to [facility] address.

## 2013-03-10 ENCOUNTER — Encounter (HOSPITAL_COMMUNITY): Payer: Self-pay | Admitting: *Deleted

## 2013-03-10 ENCOUNTER — Encounter (HOSPITAL_COMMUNITY): Payer: Self-pay | Admitting: Emergency Medicine

## 2013-03-10 ENCOUNTER — Inpatient Hospital Stay (HOSPITAL_COMMUNITY)
Admission: AD | Admit: 2013-03-10 | Discharge: 2013-03-10 | Disposition: A | Discharge status: Home / Self Care | Payer: MEDICAID | Attending: Psychiatry | Admitting: Psychiatry

## 2013-03-10 ENCOUNTER — Emergency Department (HOSPITAL_COMMUNITY)
Admission: EM | Admit: 2013-03-10 | Discharge: 2013-03-11 | Payer: MEDICAID | Attending: Emergency Medicine | Admitting: Emergency Medicine

## 2013-03-10 DIAGNOSIS — Z91199 Patient's noncompliance with other medical treatment and regimen due to unspecified reason: Secondary | ICD-10-CM | POA: Insufficient documentation

## 2013-03-10 DIAGNOSIS — Z9119 Patient's noncompliance with other medical treatment and regimen: Secondary | ICD-10-CM | POA: Insufficient documentation

## 2013-03-10 DIAGNOSIS — Z3202 Encounter for pregnancy test, result negative: Secondary | ICD-10-CM | POA: Insufficient documentation

## 2013-03-10 DIAGNOSIS — F1110 Opioid abuse, uncomplicated: Secondary | ICD-10-CM | POA: Insufficient documentation

## 2013-03-10 DIAGNOSIS — F1910 Other psychoactive substance abuse, uncomplicated: Secondary | ICD-10-CM

## 2013-03-10 DIAGNOSIS — R259 Unspecified abnormal involuntary movements: Secondary | ICD-10-CM | POA: Insufficient documentation

## 2013-03-10 DIAGNOSIS — F911 Conduct disorder, childhood-onset type: Secondary | ICD-10-CM | POA: Insufficient documentation

## 2013-03-10 DIAGNOSIS — F17200 Nicotine dependence, unspecified, uncomplicated: Secondary | ICD-10-CM | POA: Insufficient documentation

## 2013-03-10 DIAGNOSIS — F1410 Cocaine abuse, uncomplicated: Secondary | ICD-10-CM | POA: Insufficient documentation

## 2013-03-10 DIAGNOSIS — Z8659 Personal history of other mental and behavioral disorders: Secondary | ICD-10-CM | POA: Insufficient documentation

## 2013-03-10 DIAGNOSIS — IMO0002 Reserved for concepts with insufficient information to code with codable children: Secondary | ICD-10-CM | POA: Insufficient documentation

## 2013-03-10 DIAGNOSIS — F1010 Alcohol abuse, uncomplicated: Secondary | ICD-10-CM | POA: Insufficient documentation

## 2013-03-10 DIAGNOSIS — Z79899 Other long term (current) drug therapy: Secondary | ICD-10-CM | POA: Insufficient documentation

## 2013-03-10 DIAGNOSIS — Z880 Allergy status to penicillin: Secondary | ICD-10-CM | POA: Insufficient documentation

## 2013-03-10 DIAGNOSIS — R1110 Vomiting, unspecified: Secondary | ICD-10-CM | POA: Insufficient documentation

## 2013-03-10 LAB — CBC
HCT: 40.1 % (ref 36.0–46.0)
Hemoglobin: 13.7 g/dL (ref 12.0–15.0)
MCH: 30.7 pg (ref 26.0–34.0)
MCHC: 34.2 g/dL (ref 30.0–36.0)
MCV: 89.9 fL (ref 78.0–100.0)
RDW: 14.1 % (ref 11.5–15.5)

## 2013-03-10 LAB — RAPID URINE DRUG SCREEN, HOSP PERFORMED
Amphetamines: NOT DETECTED
Barbiturates: NOT DETECTED
Benzodiazepines: POSITIVE — AB
Cocaine: POSITIVE — AB
Tetrahydrocannabinol: NOT DETECTED

## 2013-03-10 LAB — SALICYLATE LEVEL: Salicylate Lvl: <2 mg/dL — ABNORMAL LOW (ref 2.8–20.0)

## 2013-03-10 LAB — COMPREHENSIVE METABOLIC PANEL
Albumin: 4 g/dL (ref 3.5–5.2)
Alkaline Phosphatase: 57 U/L (ref 39–117)
BUN: 5 mg/dL — ABNORMAL LOW (ref 6–23)
CO2: 28 mEq/L (ref 19–32)
Creatinine, Ser: 1.02 mg/dL (ref 0.50–1.10)
GFR calc Af Amer: 83 mL/min — ABNORMAL LOW (ref >90)
Glucose, Bld: 80 mg/dL (ref 70–99)
Potassium: 3.1 mEq/L — ABNORMAL LOW (ref 3.5–5.1)
Total Protein: 6.9 g/dL (ref 6.0–8.3)

## 2013-03-10 LAB — ETHANOL: Alcohol, Ethyl (B): 205 mg/dL — ABNORMAL HIGH (ref 0–11)

## 2013-03-10 LAB — POCT PREGNANCY, URINE: Preg Test, Ur: NEGATIVE

## 2013-03-10 NOTE — ED Notes (Signed)
Pt refusing blood draw at this time.

## 2013-03-10 NOTE — ED Notes (Signed)
Pt brought from [facility] for medical clearance, pt states she would like detox from crack and help with depression. Denies SI/HI
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
## 2013-03-10 NOTE — ED Notes (Signed)
Pt is voluntary at this time, pt does have a bed assignment at [facility]
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
## 2013-03-10 NOTE — ED Notes (Signed)
Pt vomited upon arrival in [facility], pt standing in hallway yelling at staff. Pt demanding ginger ale, attempted to explain to pt need for withholding food/beverages d/t very recent emesis. Pt continues to yell at staff, Field seismologist racist and prejudice against crack addicts

## 2013-03-10 NOTE — ED Notes (Signed)
Pt continues to wander in hall, pt repeatedly ask to wait in room. Pt given ice chips and warm blanket for comfort.

## 2013-03-10 NOTE — ED Notes (Signed)
Pt overheard cursing on the telephone. Pt advised this behavior will not be tolerated, pt verbalized understanding

## 2013-03-10 NOTE — Progress Notes (Signed)
Patient transported to [facility] ED with mental health technician. Mental Heath Technician called back to [facility] stating patient is in the ED and now is stating she wants to leave and doesn't want detox. Reviewed case with Alberteen Sam, NP.  Because patient is voluntary and denies suicidal ideation, Alberteen Sam NP is allowing patient to sign herself out. AC LuWanda Andris Baumann also present when this information was relayed to Alberteen Sam. Rosey Bath, RN

## 2013-03-11 MED ORDER — POTASSIUM CHLORIDE CRYS ER 20 MEQ PO TBCR
40.0000 meq | EXTENDED_RELEASE_TABLET | Freq: Once | ORAL | Status: DC
  Filled 2013-03-11: qty 2

## 2013-03-11 MED ORDER — METOCLOPRAMIDE HCL 10 MG PO TABS
10.0000 mg | ORAL_TABLET | Freq: Once | ORAL | Status: DC
  Filled 2013-03-11: qty 1

## 2013-03-11 MED ORDER — ALUM & MAG HYDROXIDE-SIMETH 200-200-20 MG/5ML PO SUSP: 30.0000 mL | ORAL | Status: DC | PRN

## 2013-03-11 MED ORDER — NICOTINE 21 MG/24HR TD PT24: 21.0000 mg | MEDICATED_PATCH | Freq: Every day | TRANSDERMAL | Status: DC

## 2013-03-11 MED ORDER — LORAZEPAM 1 MG PO TABS
1.0000 mg | ORAL_TABLET | Freq: Once | ORAL | Status: AC
  Administered 2013-03-11: 1 mg via ORAL
  Filled 2013-03-11: qty 1

## 2013-03-11 MED ORDER — LORAZEPAM 1 MG PO TABS: 0.0000 mg | ORAL_TABLET | Freq: Four times a day (QID) | ORAL | Status: DC

## 2013-03-11 MED ORDER — LORAZEPAM 1 MG PO TABS: 0.0000 mg | ORAL_TABLET | Freq: Two times a day (BID) | ORAL | Status: DC

## 2013-03-11 MED ORDER — IBUPROFEN 200 MG PO TABS: 600.0000 mg | ORAL_TABLET | Freq: Three times a day (TID) | ORAL | Status: DC | PRN

## 2013-03-11 NOTE — ED Notes (Signed)
Called Judy [name] per patient request.  Advised him that paitent wanted him to come to the hospital.  He states he is on his way

## 2013-03-11 NOTE — ED Provider Notes (Signed)
Medical screening examination/treatment/procedure(s) were performed by non-physician practitioner and as supervising physician I was immediately available for consultation/collaboration.  EKG Interpretation   None         Aerin Delany, MD 03/11/13 2357 

## 2013-03-11 NOTE — ED Provider Notes (Signed)
CSN: [id number]     Arrival date & time 03/10/13  2139 History   None    Chief Complaint  Patient presents with  . Medical Clearance   (Consider location/radiation/quality/duration/timing/severity/associated sxs/prior Treatment) HPI History provided by pt.   Pt referred to ED by [facility] for medical clearance for admission for detox from alcohol, crack cocaine and narcotics.  Per prior chart, pt was admitted to an inpatient treatment program on 03/02/13.  Since discharge, she has been using $1000 worth of crack/day.  Most recent alcoholic beverage was just pta.  Has h/o depression and is non-compliant w/ meds.  Denies SI/HI.  Past Medical History  Diagnosis Date  . Depression   . Cocaine abuse   . Polysubstance abuse   . Anxiety   . Depression    History reviewed. No pertinent past surgical history. No family history on file. History  Substance Use Topics  . Smoking status: Current Every Day Smoker -- 0.25 packs/day for 10 years    Types: Cigarettes  . Smokeless tobacco: Not on file  . Alcohol Use: Yes     Comment: drinks vodka daily. Unable to articulate amount.   OB History   Grav Para Term Preterm Abortions TAB SAB Ect Mult Living                 Review of Systems  All other systems reviewed and are negative.    Allergies  Penicillins and Zofran  Home Medications   Current Outpatient Rx  Name  Route  Sig  Dispense  Refill  . pantoprazole (PROTONIX) 40 MG tablet   Oral   Take 1 tablet (40 mg total) by mouth daily. For GERD.         Marland Kitchen potassium chloride SA (K-DUR,KLOR-CON) 20 MEQ tablet   Oral   Take 1 tablet (20 mEq total) by mouth 2 (two) times daily. For hypokalemia          BP 112/74  Pulse 100  Temp(Src) 98.8 F (37.1 C) (Oral)  Resp 18  Ht 5\' 3"  (1.6 m)  Wt 111 lb (50.349 kg)  BMI 19.67 kg/m2  SpO2 100%  LMP 02/10/2013 Physical Exam  Nursing note and vitals reviewed. Constitutional: She is oriented to person, place, and time. She appears  well-developed and well-nourished. No distress.  Crying   HENT:  Head: Normocephalic and atraumatic.  Eyes:  Normal appearance  Neck: Normal range of motion.  Cardiovascular: Normal rate and regular rhythm.   Pulmonary/Chest: Effort normal and breath sounds normal. No respiratory distress.  Abdominal:  Vomiting.  Musculoskeletal: Normal range of motion.  Neurological: She is alert and oriented to person, place, and time.  Tremulous.    Skin: Skin is warm and dry. No rash noted.  Psychiatric:  Agitated and aggressive behavior    ED Course  Procedures (including critical care time) Labs Review Labs Reviewed  COMPREHENSIVE METABOLIC PANEL - Abnormal; Notable for the following:    Potassium 3.1 (*)    BUN 5 (*)    AST 51 (*)    GFR calc non Af Amer 71 (*)    GFR calc Af Amer 83 (*)    All other components within normal limits  ETHANOL - Abnormal; Notable for the following:    Alcohol, Ethyl (B) 205 (*)    All other components within normal limits  SALICYLATE LEVEL - Abnormal; Notable for the following:    Salicylate Lvl <2.0 (*)    All other components within normal limits  URINE RAPID DRUG SCREEN (HOSP PERFORMED) - Abnormal; Notable for the following:    Cocaine POSITIVE (*)    Benzodiazepines POSITIVE (*)    All other components within normal limits  ACETAMINOPHEN LEVEL  CBC  POCT PREGNANCY, URINE   Imaging Review No results found.  EKG Interpretation   None       MDM   1. Polysubstance abuse    32yo F w/ depression and polysubstance abuse sent from [facility] for medical clearance for admission for detox from crack cocaine and alcohol. Most recent drink just pta and alcohol level 205.  She was aggressive toward nursing staff and plan was for her to go straight to jail.  However, on my exam, she is tremulous, tachycardic and vomiting.  I doubt this is secondary to alcohol withdrawal based on recent use and current alcohol level.  Will continue to monitor for short  time.  10mg  reglan as well as 40mg  po potassium replacement ordered.  Holding orders, including alcohol detox are written.    Pt apparently allergic to both zofran and reglan and refused the potassium ordered. She became agitated and requested withdrawal medication over and over.  I had withheld benzos initially because she abuses them and alcohol withdrawal unlikely, but I finally gave her 1mg  po ativan to treat nausea, agitation and withdrawal.  Vital signs nml at time of discharge.  Police officers arrested and took to jail d/t aggression toward ED staff.      Otilio Miu, PA-C 03/11/13 [phone]

## 2013-03-11 NOTE — ED Notes (Signed)
D/C  In police custody, D/C instructions given to police officer.  No N/V, No tremors, steadygait

## 2013-03-11 NOTE — ED Notes (Signed)
Patient is resting comfortably. After taking Ativan. Tolerating ginger ale with no N/V

## 2013-03-18 ENCOUNTER — Emergency Department (HOSPITAL_COMMUNITY)
Admission: EM | Admit: 2013-03-18 | Discharge: 2013-03-18 | Discharge status: Against Medical Advice | Payer: MEDICAID | Source: Home / Self Care

## 2013-03-18 ENCOUNTER — Encounter (HOSPITAL_COMMUNITY): Payer: Self-pay | Admitting: Emergency Medicine

## 2013-03-18 ENCOUNTER — Emergency Department (HOSPITAL_COMMUNITY)
Admission: EM | Admit: 2013-03-18 | Discharge: 2013-03-20 | Disposition: A | Discharge status: Home / Self Care | Payer: MEDICAID | Attending: Emergency Medicine | Admitting: Emergency Medicine

## 2013-03-18 ENCOUNTER — Ambulatory Visit (HOSPITAL_COMMUNITY)
Admission: AD | Admit: 2013-03-18 | Discharge: 2013-03-18 | Disposition: A | Discharge status: Home / Self Care | Payer: MEDICAID | Attending: Psychiatry | Admitting: Psychiatry

## 2013-03-18 DIAGNOSIS — F32A Depression, unspecified: Secondary | ICD-10-CM | POA: Diagnosis present

## 2013-03-18 DIAGNOSIS — F1010 Alcohol abuse, uncomplicated: Secondary | ICD-10-CM | POA: Insufficient documentation

## 2013-03-18 DIAGNOSIS — F1910 Other psychoactive substance abuse, uncomplicated: Secondary | ICD-10-CM | POA: Insufficient documentation

## 2013-03-18 DIAGNOSIS — F411 Generalized anxiety disorder: Secondary | ICD-10-CM | POA: Insufficient documentation

## 2013-03-18 DIAGNOSIS — F1420 Cocaine dependence, uncomplicated: Secondary | ICD-10-CM

## 2013-03-18 DIAGNOSIS — F17200 Nicotine dependence, unspecified, uncomplicated: Secondary | ICD-10-CM | POA: Insufficient documentation

## 2013-03-18 DIAGNOSIS — F3289 Other specified depressive episodes: Secondary | ICD-10-CM | POA: Insufficient documentation

## 2013-03-18 DIAGNOSIS — Z880 Allergy status to penicillin: Secondary | ICD-10-CM | POA: Insufficient documentation

## 2013-03-18 DIAGNOSIS — N390 Urinary tract infection, site not specified: Secondary | ICD-10-CM

## 2013-03-18 DIAGNOSIS — F1020 Alcohol dependence, uncomplicated: Secondary | ICD-10-CM

## 2013-03-18 DIAGNOSIS — Z3202 Encounter for pregnancy test, result negative: Secondary | ICD-10-CM | POA: Insufficient documentation

## 2013-03-18 DIAGNOSIS — F1410 Cocaine abuse, uncomplicated: Secondary | ICD-10-CM | POA: Insufficient documentation

## 2013-03-18 DIAGNOSIS — E876 Hypokalemia: Secondary | ICD-10-CM | POA: Insufficient documentation

## 2013-03-18 DIAGNOSIS — F329 Major depressive disorder, single episode, unspecified: Secondary | ICD-10-CM | POA: Insufficient documentation

## 2013-03-18 HISTORY — DX: Alcohol abuse, uncomplicated: F10.10

## 2013-03-18 LAB — COMPREHENSIVE METABOLIC PANEL
ALT: 30 U/L (ref 0–35)
ALT: 32 U/L (ref 0–35)
AST: 57 U/L — ABNORMAL HIGH (ref 0–37)
Albumin: 4.2 g/dL (ref 3.5–5.2)
Albumin: 4.2 g/dL (ref 3.5–5.2)
Alkaline Phosphatase: 66 U/L (ref 39–117)
BUN: 8 mg/dL (ref 6–23)
CO2: 31 mEq/L (ref 19–32)
CO2: 32 mEq/L (ref 19–32)
Chloride: 92 mEq/L — ABNORMAL LOW (ref 96–112)
Creatinine, Ser: 1.02 mg/dL (ref 0.50–1.10)
GFR calc Af Amer: 77 mL/min — ABNORMAL LOW (ref >90)
GFR calc Af Amer: 83 mL/min — ABNORMAL LOW (ref >90)
Glucose, Bld: 173 mg/dL — ABNORMAL HIGH (ref 70–99)
Potassium: 2.8 mEq/L — ABNORMAL LOW (ref 3.5–5.1)
Potassium: 2.5 mEq/L — CL (ref 3.5–5.1)
Sodium: 139 mEq/L (ref 135–145)
Sodium: 135 mEq/L (ref 135–145)
Total Bilirubin: 0.3 mg/dL (ref 0.3–1.2)
Total Bilirubin: 0.4 mg/dL (ref 0.3–1.2)
Total Protein: 7.6 g/dL (ref 6.0–8.3)

## 2013-03-18 LAB — CBC
HCT: 43.4 % (ref 36.0–46.0)
Hemoglobin: 15.6 g/dL — ABNORMAL HIGH (ref 12.0–15.0)
MCHC: 35.9 g/dL (ref 30.0–36.0)
MCV: 88.9 fL (ref 78.0–100.0)
RDW: 14 % (ref 11.5–15.5)

## 2013-03-18 LAB — URINALYSIS, ROUTINE W REFLEX MICROSCOPIC
Glucose, UA: NEGATIVE mg/dL
Ketones, ur: NEGATIVE mg/dL
Nitrite: NEGATIVE
Protein, ur: 30 mg/dL — AB
Specific Gravity, Urine: 1.028 (ref 1.005–1.030)
pH: 7.5 (ref 5.0–8.0)

## 2013-03-18 LAB — RAPID URINE DRUG SCREEN, HOSP PERFORMED
Amphetamines: NOT DETECTED
Barbiturates: NOT DETECTED
Barbiturates: NOT DETECTED
Benzodiazepines: NOT DETECTED
Cocaine: POSITIVE — AB
Cocaine: POSITIVE — AB
Tetrahydrocannabinol: NOT DETECTED

## 2013-03-18 LAB — CBC WITH DIFFERENTIAL/PLATELET
Basophils Absolute: 0.1 10*3/uL (ref 0.0–0.1)
Basophils Relative: 1 % (ref 0–1)
Hemoglobin: 15.5 g/dL — ABNORMAL HIGH (ref 12.0–15.0)
Lymphocytes Relative: 32 % (ref 12–46)
Lymphs Abs: 1.7 10*3/uL (ref 0.7–4.0)
MCHC: 35.6 g/dL (ref 30.0–36.0)
Monocytes Relative: 7 % (ref 3–12)
Neutro Abs: 3.3 10*3/uL (ref 1.7–7.7)
Neutrophils Relative %: 60 % (ref 43–77)
Platelets: 194 10*3/uL (ref 150–400)
RBC: 4.85 MIL/uL (ref 3.87–5.11)

## 2013-03-18 LAB — URINE MICROSCOPIC-ADD ON

## 2013-03-18 LAB — SALICYLATE LEVEL: Salicylate Lvl: <2 mg/dL — ABNORMAL LOW (ref 2.8–20.0)

## 2013-03-18 LAB — ETHANOL: Alcohol, Ethyl (B): 99 mg/dL — ABNORMAL HIGH (ref 0–11)

## 2013-03-18 MED ORDER — PANTOPRAZOLE SODIUM 40 MG IV SOLR
40.0000 mg | Freq: Once | INTRAVENOUS | Status: AC
  Administered 2013-03-18: 40 mg via INTRAVENOUS
  Filled 2013-03-18: qty 40

## 2013-03-18 MED ORDER — LORAZEPAM 1 MG PO TABS
0.0000 mg | ORAL_TABLET | Freq: Four times a day (QID) | ORAL | Status: AC
  Administered 2013-03-19: 1 mg via ORAL
  Filled 2013-03-18: qty 1

## 2013-03-18 MED ORDER — IBUPROFEN 200 MG PO TABS: 600.0000 mg | ORAL_TABLET | Freq: Three times a day (TID) | ORAL | Status: DC | PRN

## 2013-03-18 MED ORDER — PROCHLORPERAZINE MALEATE 10 MG PO TABS
10.0000 mg | ORAL_TABLET | Freq: Three times a day (TID) | ORAL | Status: DC | PRN
  Administered 2013-03-18 – 2013-03-20 (×2): 10 mg via ORAL
  Filled 2013-03-18 (×2): qty 1

## 2013-03-18 MED ORDER — POTASSIUM CHLORIDE 10 MEQ/100ML IV SOLN
10.0000 meq | Freq: Once | INTRAVENOUS | Status: AC
  Administered 2013-03-18: 10 meq via INTRAVENOUS
  Filled 2013-03-18: qty 100

## 2013-03-18 MED ORDER — THIAMINE HCL 100 MG/ML IJ SOLN
100.0000 mg | Freq: Every day | INTRAMUSCULAR | Status: DC
  Administered 2013-03-18: 100 mg via INTRAVENOUS
  Filled 2013-03-18: qty 2

## 2013-03-18 MED ORDER — ZOLPIDEM TARTRATE 5 MG PO TABS
5.0000 mg | ORAL_TABLET | Freq: Every evening | ORAL | Status: DC | PRN
  Administered 2013-03-19: 5 mg via ORAL
  Filled 2013-03-18: qty 1

## 2013-03-18 MED ORDER — POTASSIUM CHLORIDE CRYS ER 20 MEQ PO TBCR
40.0000 meq | EXTENDED_RELEASE_TABLET | Freq: Two times a day (BID) | ORAL | Status: DC
  Administered 2013-03-18 – 2013-03-19 (×3): 40 meq via ORAL
  Filled 2013-03-18 (×5): qty 2

## 2013-03-18 MED ORDER — METOCLOPRAMIDE HCL 5 MG/ML IJ SOLN
10.0000 mg | Freq: Once | INTRAMUSCULAR | Status: AC
  Administered 2013-03-18: 10 mg via INTRAVENOUS
  Filled 2013-03-18: qty 2

## 2013-03-18 MED ORDER — SODIUM CHLORIDE 0.9 % IV BOLUS (SEPSIS)
1000.0000 mL | Freq: Once | INTRAVENOUS | Status: AC
  Administered 2013-03-18: 1000 mL via INTRAVENOUS

## 2013-03-18 MED ORDER — VITAMIN B-1 100 MG PO TABS
100.0000 mg | ORAL_TABLET | Freq: Every day | ORAL | Status: DC
  Administered 2013-03-19: 100 mg via ORAL
  Filled 2013-03-18 (×2): qty 1

## 2013-03-18 MED ORDER — ACETAMINOPHEN 325 MG PO TABS: 650.0000 mg | ORAL_TABLET | ORAL | Status: DC | PRN

## 2013-03-18 MED ORDER — NICOTINE 21 MG/24HR TD PT24
21.0000 mg | MEDICATED_PATCH | Freq: Every day | TRANSDERMAL | Status: DC
  Filled 2013-03-18: qty 1

## 2013-03-18 MED ORDER — LORAZEPAM 1 MG PO TABS
0.0000 mg | ORAL_TABLET | Freq: Two times a day (BID) | ORAL | Status: DC
  Administered 2013-03-20: 0 mg via ORAL
  Filled 2013-03-18: qty 1

## 2013-03-18 MED ORDER — LORAZEPAM 2 MG/ML IJ SOLN
1.0000 mg | Freq: Once | INTRAMUSCULAR | Status: AC
  Administered 2013-03-18: 1 mg via INTRAVENOUS
  Filled 2013-03-18 (×2): qty 1

## 2013-03-18 NOTE — ED Notes (Signed)
Pt. requesting detox for alcohol and cocaine abuse and depression , denies suicidal ideation . Pt. stated alcohol and cocaine intake today .
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
## 2013-03-18 NOTE — BH Assessment (Signed)
Pt locus score at 26, recommendation is level 5.

## 2013-03-18 NOTE — ED Provider Notes (Signed)
8:53 AM Pt began vomiting. Will give IVFs, IV reglan, IV K and IV ativan  Lyanne Co, MD 03/18/13 [phone]

## 2013-03-18 NOTE — ED Notes (Signed)
Pt reports she was seen at [facility] for depression and substance abuse and was told she needed to be medically cleared. Pt reports last drinking alcohol 30 minutes ago and last used cocaine 1 hour ago. Reports using 800-900$ a day of alcohol, and 500$ worth of crack cocaine "as much as I can get my hands on." Pt denies SI/HI or A/V/H "unless I'm high." pt rocking back and forth in triage stating "I'm going through withdrawal already." Pt calm and cooperative in triage.

## 2013-03-18 NOTE — ED Notes (Signed)
Pt agitated and actively vomiting in triage 4. Unable to take PO meds, CWIA scale tried, pt not very compliant with scale. Pt taken to room 13 due to active vomiting and agitation.

## 2013-03-18 NOTE — ED Provider Notes (Signed)
CSN: [id number]     Arrival date & time 03/18/13  4098 History   First MD Initiated Contact with Patient 03/18/13 0755     Chief Complaint  Patient presents with  . Medical Clearance   HPI Patient requests alcohol from detox and cocaine.  No homicidal or suicidal thoughts.  She has failed outpatient alcohol detox within the past several weeks.  She states this time "it is different".  Patient is calm and cooperative.  She has no other complaints.  She denies abdominal pain.  No nausea or vomiting.  [facility] earlier this morning and they sent her to the emergency department for "medical clearance"    Past Medical History  Diagnosis Date  . Depression   . Cocaine abuse   . Polysubstance abuse   . Anxiety   . Depression   . Alcohol abuse    History reviewed. No pertinent past surgical history. History reviewed. No pertinent family history. History  Substance Use Topics  . Smoking status: Current Every Day Smoker -- 0.25 packs/day for 10 years    Types: Cigarettes  . Smokeless tobacco: Not on file  . Alcohol Use: Yes     Comment: drinks vodka daily. Unable to articulate amount.   OB History   Grav Para Term Preterm Abortions TAB SAB Ect Mult Living                 Review of Systems  All other systems reviewed and are negative.    Allergies  Penicillins; Zofran; and Sulfamethoxazole  Home Medications   Current Outpatient Rx  Name  Route  Sig  Dispense  Refill  . potassium chloride SA (K-DUR,KLOR-CON) 20 MEQ tablet   Oral   Take 1 tablet (20 mEq total) by mouth 2 (two) times daily. For hypokalemia          BP 120/87  Pulse 93  Temp(Src) 98.3 F (36.8 C) (Oral)  Resp 20  SpO2 95%  LMP 03/12/2013 Physical Exam  Nursing note and vitals reviewed. Constitutional: She is oriented to person, place, and time. She appears well-developed and well-nourished. No distress.  HENT:  Head: Normocephalic and atraumatic.  Eyes:  EOM are normal.  Neck: Normal range of motion.  Cardiovascular: Normal rate, regular rhythm and normal heart sounds.   Pulmonary/Chest: Effort normal and breath sounds normal.  Abdominal: Soft. She exhibits no distension. There is no tenderness.  Musculoskeletal: Normal range of motion.  Neurological: She is alert and oriented to person, place, and time.  Skin: Skin is warm and dry.  Psychiatric: She has a normal mood and affect. Judgment normal.    ED Course  Procedures (including critical care time) Labs Review Labs Reviewed  CBC - Abnormal; Notable for the following:    Hemoglobin 15.6 (*)    All other components within normal limits  COMPREHENSIVE METABOLIC PANEL - Abnormal; Notable for the following:    Potassium 2.5 (*)    Chloride 89 (*)    Glucose, Bld 173 (*)    AST 54 (*)    GFR calc non Af Amer 71 (*)    GFR calc Af Amer 83 (*)    All other components within normal limits  ETHANOL - Abnormal; Notable for the following:    Alcohol, Ethyl (B) 99 (*)    All other components within normal limits  SALICYLATE LEVEL - Abnormal; Notable for the following:    Salicylate Lvl <2.0 (*)  All other components within normal limits  URINE RAPID DRUG SCREEN (HOSP PERFORMED) - Abnormal; Notable for the following:    Cocaine POSITIVE (*)    All other components within normal limits  URINALYSIS, ROUTINE W REFLEX MICROSCOPIC - Abnormal; Notable for the following:    APPearance CLOUDY (*)    Hgb urine dipstick SMALL (*)    Protein, ur 30 (*)    Leukocytes, UA SMALL (*)    All other components within normal limits  URINE MICROSCOPIC-ADD ON - Abnormal; Notable for the following:    Squamous Epithelial / LPF FEW (*)    Bacteria, UA MANY (*)    All other components within normal limits  URINE CULTURE  ACETAMINOPHEN LEVEL  POCT PREGNANCY, URINE   Imaging Review No results found.  EKG Interpretation   None       MDM  No diagnosis found. ETOH detox. Polysubstance abuse.  Will place in psych holding. CIWA protocol. Hypokalemia, replaced orally. Medically clear at this time. 40 meq Kdur BID x 3 days    Lyanne Co, MD 03/18/13 [phone]

## 2013-03-18 NOTE — ED Notes (Signed)
Security wanded pt. At triage , clothes bagged and labelled .

## 2013-03-18 NOTE — ED Notes (Signed)
Triage nurse explained delay , wait time and process to pt.

## 2013-03-18 NOTE — ED Notes (Signed)
Paper scrubs given to pt. at triage , security notified to wand pt.

## 2013-03-18 NOTE — Consult Note (Signed)
[facility] Face-to-Face Psychiatry Consult   Reason for Consult:  ED referral  Referring Physician:  ED Providers /Dr Janeal Holmes Joice Judy [name] is an 32 y.o. female.self referred after 1 month continuous binge on alcohol and crack cocaine S/P release from [facility] and referral to [facility] and [facility] where she stayed for only 1 day of treatment at each facility.  Assessment: AXIS I:  Polysubstance dependence (alcoho land cocaine);SIMD;PTSD AXIS II:  Deferred AXIS III:   Past Medical History  Diagnosis Date  . Depression   . Cocaine abuse   . Polysubstance abuse   . Anxiety   . Depression   . Alcohol abuse    AXIS IV:  problems with primary support group AXIS V:  31-40 impairment in reality testing  Plan:  Continue detox in ED and stabilization of labs (K+)  Subjective:   Judy [name] is a 32 y.o. female patient admitted with continuous addiction to alcohol and cocaine despite admission to [facility] and referrals to [facility] and [facility].  HPI:  Pt with over 10 yr hx of dependence with current hx as above HPI Elements:   Context:  as above.  Past Psychiatric History: Past Medical History  Diagnosis Date  . Depression   . Cocaine abuse   . Polysubstance abuse   . Anxiety   . Depression   . Alcohol abuse     reports that she has been smoking Cigarettes.  She has a 2.5 pack-year smoking history. She does not have any smokeless tobacco history on file. She reports that she drinks alcohol. She reports that she uses illicit drugs (Cocaine). History reviewed. No pertinent family history.         Allergies:   Allergies  Allergen Reactions  . Penicillins Hives  . Zofran [Ondansetron Hcl] Nausea And Vomiting  . Sulfamethoxazole Rash    ACT Assessment Complete:  Yes:    Educational Status    Risk to Self: Risk to self Is patient at risk for suicide?: No, but patient needs Medical Clearance Substance abuse history and/or treatment for substance abuse?: Yes  Risk to Others:    Abuse:     Prior Inpatient Therapy:    Prior Outpatient Therapy:    Additional Information:                    Objective: Blood pressure 116/71, pulse 93, temperature 98.3 F (36.8 C), temperature source Oral, resp. rate 20, last menstrual period 03/12/2013, SpO2 95.00%.There is no weight on file to calculate BMI. Results for orders placed during the hospital encounter of 03/18/13 (from the past 72 hour(s))  ACETAMINOPHEN LEVEL     Status: None   Collection Time    03/18/13  4:53 AM      Result Value Range   Acetaminophen (Tylenol), Serum <15.0  10 - 30 ug/mL   Comment:            THERAPEUTIC CONCENTRATIONS VARY     SIGNIFICANTLY. A RANGE OF 10-30     ug/mL MAY BE AN EFFECTIVE     CONCENTRATION FOR MANY PATIENTS.     HOWEVER, SOME ARE BEST TREATED     AT CONCENTRATIONS OUTSIDE THIS     RANGE.     ACETAMINOPHEN CONCENTRATIONS     >150 ug/mL AT 4 HOURS AFTER     INGESTION AND >50 ug/mL AT 12     HOURS AFTER INGESTION ARE     OFTEN ASSOCIATED WITH TOXIC     REACTIONS.  CBC  Status: Abnormal   Collection Time    03/18/13  4:53 AM      Result Value Range   WBC 7.1  4.0 - 10.5 K/uL   RBC 4.88  3.87 - 5.11 MIL/uL   Hemoglobin 15.6 (*) 12.0 - 15.0 g/dL   HCT 78.4  69.6 - 29.5 %   MCV 88.9  78.0 - 100.0 fL   MCH 32.0  26.0 - 34.0 pg   MCHC 35.9  30.0 - 36.0 g/dL   RDW 28.4  13.2 - 44.0 %   Platelets 195  150 - 400 K/uL  COMPREHENSIVE METABOLIC PANEL     Status: Abnormal   Collection Time    03/18/13  4:53 AM      Result Value Range   Sodium 135  135 - 145 mEq/L   Potassium 2.5 (*) 3.5 - 5.1 mEq/L   Comment: CRITICAL RESULT CALLED TO, READ BACK BY AND VERIFIED WITH:     MAYES,L RN @0530  ON 12.27.2014 BY MCREYNOLDS,B   Chloride 89 (*) 96 - 112 mEq/L   CO2 32  19 - 32 mEq/L   Glucose, Bld 173 (*) 70 - 99 mg/dL   BUN 8  6 - 23 mg/dL   Creatinine, Ser 1.02  0.50 - 1.10 mg/dL   Calcium 9.8  8.4 - 72.5 mg/dL   Total Protein 7.6  6.0 - 8.3 g/dL   Albumin 4.2  3.5 - 5.2  g/dL   AST 54 (*) 0 - 37 U/L   ALT 32  0 - 35 U/L   Alkaline Phosphatase 66  39 - 117 U/L   Total Bilirubin 0.4  0.3 - 1.2 mg/dL   GFR calc non Af Amer 71 (*) >90 mL/min   GFR calc Af Amer 83 (*) >90 mL/min   Comment: (NOTE)     The eGFR has been calculated using the CKD EPI equation.     This calculation has not been validated in all clinical situations.     eGFR's persistently <90 mL/min signify possible Chronic Kidney     Disease.  ETHANOL     Status: Abnormal   Collection Time    03/18/13  4:53 AM      Result Value Range   Alcohol, Ethyl (B) 99 (*) 0 - 11 mg/dL   Comment:            LOWEST DETECTABLE LIMIT FOR     SERUM ALCOHOL IS 11 mg/dL     FOR MEDICAL PURPOSES ONLY  SALICYLATE LEVEL     Status: Abnormal   Collection Time    03/18/13  4:53 AM      Result Value Range   Salicylate Lvl <2.0 (*) 2.8 - 20.0 mg/dL  URINE RAPID DRUG SCREEN (HOSP PERFORMED)     Status: Abnormal   Collection Time    03/18/13  5:33 AM      Result Value Range   Opiates NONE DETECTED  NONE DETECTED   Cocaine POSITIVE (*) NONE DETECTED   Benzodiazepines NONE DETECTED  NONE DETECTED   Amphetamines NONE DETECTED  NONE DETECTED   Tetrahydrocannabinol NONE DETECTED  NONE DETECTED   Barbiturates NONE DETECTED  NONE DETECTED   Comment:            DRUG SCREEN FOR MEDICAL PURPOSES     ONLY.  IF CONFIRMATION IS NEEDED     FOR ANY PURPOSE, NOTIFY LAB     WITHIN 5 DAYS.  LOWEST DETECTABLE LIMITS     FOR URINE DRUG SCREEN     Drug Class       Cutoff (ng/mL)     Amphetamine      1000     Barbiturate      200     Benzodiazepine   200     Tricyclics       300     Opiates          300     Cocaine          300     THC              50  URINALYSIS, ROUTINE W REFLEX MICROSCOPIC     Status: Abnormal   Collection Time    03/18/13  5:33 AM      Result Value Range   Color, Urine YELLOW  YELLOW   APPearance CLOUDY (*) CLEAR   Specific Gravity, Urine 1.028  1.005 - 1.030   pH 7.5  5.0 - 8.0    Glucose, UA NEGATIVE  NEGATIVE mg/dL   Hgb urine dipstick SMALL (*) NEGATIVE   Bilirubin Urine NEGATIVE  NEGATIVE   Ketones, ur NEGATIVE  NEGATIVE mg/dL   Protein, ur 30 (*) NEGATIVE mg/dL   Urobilinogen, UA 1.0  0.0 - 1.0 mg/dL   Nitrite NEGATIVE  NEGATIVE   Leukocytes, UA SMALL (*) NEGATIVE  URINE MICROSCOPIC-ADD ON     Status: Abnormal   Collection Time    03/18/13  5:33 AM      Result Value Range   Squamous Epithelial / LPF FEW (*) RARE   WBC, UA 3-6  <3 WBC/hpf   RBC / HPF 7-10  <3 RBC/hpf   Bacteria, UA MANY (*) RARE  POCT PREGNANCY, URINE     Status: None   Collection Time    03/18/13  5:56 AM      Result Value Range   Preg Test, Ur NEGATIVE  NEGATIVE   Comment:            THE SENSITIVITY OF THIS     METHODOLOGY IS >24 mIU/mL   Labs are reviewed and are pertinent for K+ 2.5.  Current Facility-Administered Medications  Medication Dose Route Frequency Provider Last Rate Last Dose  . acetaminophen (TYLENOL) tablet 650 mg  650 mg Oral Q4H PRN Lyanne Co, MD      . ibuprofen (ADVIL,MOTRIN) tablet 600 mg  600 mg Oral Q8H PRN Lyanne Co, MD      . LORazepam (ATIVAN) tablet 0-4 mg  0-4 mg Oral Q6H Lyanne Co, MD       Followed by  . [START ON 03/20/2013] LORazepam (ATIVAN) tablet 0-4 mg  0-4 mg Oral Q12H Lyanne Co, MD      . nicotine (NICODERM CQ - dosed in mg/24 hours) patch 21 mg  21 mg Transdermal Daily Lyanne Co, MD      . potassium chloride SA (K-DUR,KLOR-CON) CR tablet 40 mEq  40 mEq Oral BID Lyanne Co, MD      . thiamine (VITAMIN B-1) tablet 100 mg  100 mg Oral Daily Lyanne Co, MD       Or  . thiamine (B-1) injection 100 mg  100 mg Intravenous Daily Lyanne Co, MD   100 mg at 03/18/13 0940  . zolpidem (AMBIEN) tablet 5 mg  5 mg Oral QHS PRN Lyanne Co, MD       Current  Outpatient Prescriptions  Medication Sig Dispense Refill  . potassium chloride SA (K-DUR,KLOR-CON) 20 MEQ tablet Take 1 tablet (20 mEq total) by mouth 2 (two)  times daily. For hypokalemia        Psychiatric Specialty Exam:     Blood pressure 116/71, pulse 93, temperature 98.3 F (36.8 C), temperature source Oral, resp. rate 20, last menstrual period 03/12/2013, SpO2 95.00%.There is no weight on file to calculate BMI.  General Appearance: Pt is somnolent/appears "crashed "from cocaine run  Patent attorney::  Absent  Speech:  NA  Volume:  na  Mood:  somnolent  Affect:  NA  Thought Process:  NA  Orientation:  NA  Thought Content:  NA  Suicidal Thoughts:  No denied to ED provider  Homicidal Thoughts:  No denied to ED provider  Memory:  NA  Judgement:  NA  Insight:  NA  Psychomotor Activity:  Negative  Concentration:  Negative  Recall:  Negative  Akathisia:  Negative  Handed:  Right ?  AIMS (if indicated):NA     Assets:  Financial Resources/Insurance  Sleep:      Treatment Plan Summary: Continue Detox in ED and reassess tomorrow per Dr Victorino December E 03/18/2013 12:39 PM  Patient is seen for psych evaluation and case discussed with physician extender. Reviewed the information documented and agree with the treatment plan.  Jamai Dolce,JANARDHAHA R. 03/19/2013 12:33 PM

## 2013-03-19 ENCOUNTER — Encounter (HOSPITAL_COMMUNITY): Payer: Self-pay | Admitting: Registered Nurse

## 2013-03-19 DIAGNOSIS — F1420 Cocaine dependence, uncomplicated: Secondary | ICD-10-CM

## 2013-03-19 DIAGNOSIS — F329 Major depressive disorder, single episode, unspecified: Secondary | ICD-10-CM | POA: Diagnosis present

## 2013-03-19 NOTE — Consult Note (Signed)
Follow up Psychiatry Consult   Reason for Consult:  ED referral  Referring Physician:  ED Providers /Dr Janeal Holmes Judy [name] is an 32 y.o. female.self referred after 1 month continuous binge on alcohol and crack cocaine S/P release from [facility] and referral to [facility] and [facility] where she stayed for only 1 day of treatment at each facility.  Assessment: AXIS I:  Polysubstance dependence (alcoho land cocaine);SIMD;PTSD AXIS II:  Deferred AXIS III:   Past Medical History  Diagnosis Date  . Depression   . Cocaine abuse   . Polysubstance abuse   . Anxiety   . Depression   . Alcohol abuse    AXIS IV:  problems with primary support group AXIS V:  31-40 impairment in reality testing  Plan:  Continue detox in ED and stabilization of labs (K+)  Subjective:   Judy [name] is a 32 y.o. female patient admitted with continuous addiction to alcohol and cocaine despite admission to [facility] and referrals to [facility] and [facility].  HPI:  Patient states "I went to [facility] and they sent me here. I need help with depression, alcohol and drug abuse."  Patient states that she has been throwing up and sweating all night.  "I just woke up I haven't been able to sleep all night because of throwing up and sweating.    HPI Elements:   Context:  as above.  Past Psychiatric History: Past Medical History  Diagnosis Date  . Depression   . Cocaine abuse   . Polysubstance abuse   . Anxiety   . Depression   . Alcohol abuse     reports that she has been smoking Cigarettes.  She has a 2.5 pack-year smoking history. She does not have any smokeless tobacco history on file. She reports that she drinks alcohol. She reports that she uses illicit drugs (Cocaine). History reviewed. No pertinent family history.         Allergies:   Allergies  Allergen Reactions  . Penicillins Hives  . Zofran [Ondansetron Hcl] Nausea And Vomiting  . Sulfamethoxazole Rash    ACT Assessment Complete:  Yes:    Educational Status     Risk to Self: Risk to self Is patient at risk for suicide?: No, but patient needs Medical Clearance Substance abuse history and/or treatment for substance abuse?: Yes (BAL 99     UDS positive for cocaine)  Risk to Others:    Abuse:    Prior Inpatient Therapy:    Prior Outpatient Therapy:    Additional Information:     Objective: Blood pressure 117/89, pulse 90, temperature 97.8 F (36.6 C), temperature source Oral, resp. rate 18, last menstrual period 03/12/2013, SpO2 96.00%.There is no weight on file to calculate BMI. Results for orders placed during the hospital encounter of 03/18/13 (from the past 72 hour(s))  ACETAMINOPHEN LEVEL     Status: None   Collection Time    03/18/13  4:53 AM      Result Value Range   Acetaminophen (Tylenol), Serum <15.0  10 - 30 ug/mL   Comment:            THERAPEUTIC CONCENTRATIONS VARY     SIGNIFICANTLY. A RANGE OF 10-30     ug/mL MAY BE AN EFFECTIVE     CONCENTRATION FOR MANY PATIENTS.     HOWEVER, SOME ARE BEST TREATED     AT CONCENTRATIONS OUTSIDE THIS     RANGE.     ACETAMINOPHEN CONCENTRATIONS     >150 ug/mL  AT 4 HOURS AFTER     INGESTION AND >50 ug/mL AT 12     HOURS AFTER INGESTION ARE     OFTEN ASSOCIATED WITH TOXIC     REACTIONS.  CBC     Status: Abnormal   Collection Time    03/18/13  4:53 AM      Result Value Range   WBC 7.1  4.0 - 10.5 K/uL   RBC 4.88  3.87 - 5.11 MIL/uL   Hemoglobin 15.6 (*) 12.0 - 15.0 g/dL   HCT 16.1  09.6 - 04.5 %   MCV 88.9  78.0 - 100.0 fL   MCH 32.0  26.0 - 34.0 pg   MCHC 35.9  30.0 - 36.0 g/dL   RDW 40.9  81.1 - 91.4 %   Platelets 195  150 - 400 K/uL  COMPREHENSIVE METABOLIC PANEL     Status: Abnormal   Collection Time    03/18/13  4:53 AM      Result Value Range   Sodium 135  135 - 145 mEq/L   Potassium 2.5 (*) 3.5 - 5.1 mEq/L   Comment: CRITICAL RESULT CALLED TO, READ BACK BY AND VERIFIED WITH:     MAYES,L RN @0530  ON 12.27.2014 BY MCREYNOLDS,B   Chloride 89 (*) 96 - 112 mEq/L   CO2 32  19 -  32 mEq/L   Glucose, Bld 173 (*) 70 - 99 mg/dL   BUN 8  6 - 23 mg/dL   Creatinine, Ser 7.82  0.50 - 1.10 mg/dL   Calcium 9.8  8.4 - 95.6 mg/dL   Total Protein 7.6  6.0 - 8.3 g/dL   Albumin 4.2  3.5 - 5.2 g/dL   AST 54 (*) 0 - 37 U/L   ALT 32  0 - 35 U/L   Alkaline Phosphatase 66  39 - 117 U/L   Total Bilirubin 0.4  0.3 - 1.2 mg/dL   GFR calc non Af Amer 71 (*) >90 mL/min   GFR calc Af Amer 83 (*) >90 mL/min   Comment: (NOTE)     The eGFR has been calculated using the CKD EPI equation.     This calculation has not been validated in all clinical situations.     eGFR's persistently <90 mL/min signify possible Chronic Kidney     Disease.  ETHANOL     Status: Abnormal   Collection Time    03/18/13  4:53 AM      Result Value Range   Alcohol, Ethyl (B) 99 (*) 0 - 11 mg/dL   Comment:            LOWEST DETECTABLE LIMIT FOR     SERUM ALCOHOL IS 11 mg/dL     FOR MEDICAL PURPOSES ONLY  SALICYLATE LEVEL     Status: Abnormal   Collection Time    03/18/13  4:53 AM      Result Value Range   Salicylate Lvl <2.0 (*) 2.8 - 20.0 mg/dL  URINE RAPID DRUG SCREEN (HOSP PERFORMED)     Status: Abnormal   Collection Time    03/18/13  5:33 AM      Result Value Range   Opiates NONE DETECTED  NONE DETECTED   Cocaine POSITIVE (*) NONE DETECTED   Benzodiazepines NONE DETECTED  NONE DETECTED   Amphetamines NONE DETECTED  NONE DETECTED   Tetrahydrocannabinol NONE DETECTED  NONE DETECTED   Barbiturates NONE DETECTED  NONE DETECTED   Comment:  DRUG SCREEN FOR MEDICAL PURPOSES     ONLY.  IF CONFIRMATION IS NEEDED     FOR ANY PURPOSE, NOTIFY LAB     WITHIN 5 DAYS.                LOWEST DETECTABLE LIMITS     FOR URINE DRUG SCREEN     Drug Class       Cutoff (ng/mL)     Amphetamine      1000     Barbiturate      200     Benzodiazepine   200     Tricyclics       300     Opiates          300     Cocaine          300     THC              50  URINALYSIS, ROUTINE W REFLEX MICROSCOPIC      Status: Abnormal   Collection Time    03/18/13  5:33 AM      Result Value Range   Color, Urine YELLOW  YELLOW   APPearance CLOUDY (*) CLEAR   Specific Gravity, Urine 1.028  1.005 - 1.030   pH 7.5  5.0 - 8.0   Glucose, UA NEGATIVE  NEGATIVE mg/dL   Hgb urine dipstick SMALL (*) NEGATIVE   Bilirubin Urine NEGATIVE  NEGATIVE   Ketones, ur NEGATIVE  NEGATIVE mg/dL   Protein, ur 30 (*) NEGATIVE mg/dL   Urobilinogen, UA 1.0  0.0 - 1.0 mg/dL   Nitrite NEGATIVE  NEGATIVE   Leukocytes, UA SMALL (*) NEGATIVE  URINE MICROSCOPIC-ADD ON     Status: Abnormal   Collection Time    03/18/13  5:33 AM      Result Value Range   Squamous Epithelial / LPF FEW (*) RARE   WBC, UA 3-6  <3 WBC/hpf   RBC / HPF 7-10  <3 RBC/hpf   Bacteria, UA MANY (*) RARE  URINE CULTURE     Status: None   Collection Time    03/18/13  5:33 AM      Result Value Range   Specimen Description URINE, CLEAN CATCH     Special Requests NONE     Culture  Setup Time       Value: 03/18/2013 17:17     Performed at [facility] Count       Value: >=100,000 COLONIES/ML     Performed at [facility]   Culture       Value: ESCHERICHIA COLI     Performed at [facility]   Report Status PENDING    POCT PREGNANCY, URINE     Status: None   Collection Time    03/18/13  5:56 AM      Result Value Range   Preg Test, Ur NEGATIVE  NEGATIVE   Comment:            THE SENSITIVITY OF THIS     METHODOLOGY IS >24 mIU/mL  POTASSIUM     Status: Abnormal   Collection Time    03/18/13  4:05 PM      Result Value Range   Potassium 3.2 (*) 3.5 - 5.1 mEq/L   Comment: REPEATED TO VERIFY     DELTA CHECK NOTED     NO VISIBLE HEMOLYSIS   Labs are reviewed and are pertinent for K+ 2.5.  Current Facility-Administered  Medications  Medication Dose Route Frequency Provider Last Rate Last Dose  . acetaminophen (TYLENOL) tablet 650 mg  650 mg Oral Q4H PRN Lyanne Co, MD      . ibuprofen (ADVIL,MOTRIN) tablet 600  mg  600 mg Oral Q8H PRN Lyanne Co, MD      . LORazepam (ATIVAN) tablet 0-4 mg  0-4 mg Oral Q6H Lyanne Co, MD   1 mg at 03/19/13 0602   Followed by  . [START ON 03/20/2013] LORazepam (ATIVAN) tablet 0-4 mg  0-4 mg Oral Q12H Lyanne Co, MD      . nicotine (NICODERM CQ - dosed in mg/24 hours) patch 21 mg  21 mg Transdermal Daily Lyanne Co, MD      . potassium chloride SA (K-DUR,KLOR-CON) CR tablet 40 mEq  40 mEq Oral BID Lyanne Co, MD   40 mEq at 03/19/13 1152  . prochlorperazine (COMPAZINE) tablet 10 mg  10 mg Oral TID PRN Court Joy, PA-C   10 mg at 03/18/13 6213  . thiamine (VITAMIN B-1) tablet 100 mg  100 mg Oral Daily Lyanne Co, MD   100 mg at 03/19/13 1152   Or  . thiamine (B-1) injection 100 mg  100 mg Intravenous Daily Lyanne Co, MD   100 mg at 03/18/13 0940  . zolpidem (AMBIEN) tablet 5 mg  5 mg Oral QHS PRN Lyanne Co, MD       Current Outpatient Prescriptions  Medication Sig Dispense Refill  . potassium chloride SA (K-DUR,KLOR-CON) 20 MEQ tablet Take 1 tablet (20 mEq total) by mouth 2 (two) times daily. For hypokalemia        Psychiatric Specialty Exam:     Blood pressure 117/89, pulse 90, temperature 97.8 F (36.6 C), temperature source Oral, resp. rate 18, last menstrual period 03/12/2013, SpO2 96.00%.There is no weight on file to calculate BMI.  General Appearance: Pt is somnolent/appears "crashed "from cocaine run  Patent attorney::  Absent  Speech:  NA  Volume:  na  Mood:  somnolent  Affect:  NA  Thought Process:  NA  Orientation:  NA  Thought Content:  NA  Suicidal Thoughts:  No denied to ED provider  Homicidal Thoughts:  No denied to ED provider  Memory:  NA  Judgement:  NA  Insight:  NA  Psychomotor Activity:  Negative  Concentration:  Negative  Recall:  Negative  Akathisia:  Negative  Handed:  Right ?  AIMS (if indicated):NA     Assets:  Financial Resources/Insurance  Sleep:      Face to face consult/interview with  Dr. Elsie Saas  Treatment Plan Summary: Continue with Detox in ED  Disposition:  Will continue to detox in the ED.  Reassess patient in the morning for continued need for detox.  Once detox completed; patient can be given resources for long term rehab facilities and outpatient resources.    Assunta Found, FNP-BC 03/19/2013 12:42 PM  Patient is seen face-to-face for psychiatric evaluation evaluation with the physician extender and reviewed the information documented and agree with the treatment plan.   Aryianna Earwood,JANARDHAHA R. 03/19/2013 3:06 PM

## 2013-03-20 DIAGNOSIS — F1020 Alcohol dependence, uncomplicated: Secondary | ICD-10-CM

## 2013-03-20 LAB — URINE CULTURE: Colony Count: >=100000

## 2013-03-20 MED ORDER — CIPROFLOXACIN HCL 500 MG PO TABS: 500.0000 mg | ORAL_TABLET | Freq: Two times a day (BID) | ORAL | Status: DC

## 2013-03-20 MED ORDER — CIPROFLOXACIN HCL 500 MG PO TABS
500.0000 mg | ORAL_TABLET | Freq: Two times a day (BID) | ORAL | Status: DC
  Administered 2013-03-20: 500 mg via ORAL
  Filled 2013-03-20: qty 1

## 2013-03-20 MED ORDER — POTASSIUM CHLORIDE ER 10 MEQ PO TBCR: 10.0000 meq | EXTENDED_RELEASE_TABLET | Freq: Every day | ORAL | Status: DC

## 2013-03-20 NOTE — Progress Notes (Signed)
Patient ID: Judy [name], female   DOB: 1980/09/19, 32 y.o.   MRN: [id number] Psychiatric Specialty Exam: Physical Exam  ROS  Blood pressure 108/69, pulse 58, temperature 98.9 F (37.2 C), temperature source Oral, resp. rate 16, last menstrual period 03/12/2013, SpO2 100.00%.There is no weight on file to calculate BMI.  General Appearance: Casual and Disheveled  Eye Contact::  Good  Speech:  Clear and Coherent and Normal Rate  Volume:  Normal  Mood:  Anxious, Depressed, Hopeless and Worthless  Affect:  Congruent, Depressed, Flat and Tearful  Thought Process:  Coherent, Goal Directed and Intact  Orientation:  Full (Time, Place, and Person)  Thought Content:  NA  Suicidal Thoughts:  No  Homicidal Thoughts:  No  Memory:  Immediate;   Good Recent;   Fair Remote;   Fair  Judgement:  Poor  Insight:  Shallow  Psychomotor Activity:  Normal, Tremor and Mild tremors of her fingers are noted.  Concentration:  Fair  Recall:  NA  Akathisia:  NA  Handed:  Right  AIMS (if indicated):     Assets:  Desire for Improvement  Sleep:      Patient was seen this am with Dr Tawni Carnes on rounds.  She was calm and cooperative.  Patient is asking for another chance to detox from Alcohol and Cocaine.  Patient was in treatment in our Chemical dependency unit back in November 21 st -November 26 th.  Patient admitted to have asked to be discharged earlier than expected because she was not quite ready to stop using.  Patient states that while in treatment she was craving for alcohol and Cocaine.  Patient also states she has been in detox treatment at numerous facilities.  Patient states she is now ready to stop because he new boyfriend is clean and is encouraging her to stop using.  Patient is promising to complete her treatment and go to a rehabilitation facility.  Patient admits to spending $1000/day on alcohol and cocaine.  She denies SI/HI/AVH.  Plan: We will continue to offer patient Ativan for alcohol  detox  We will seek admission at our Chemical dependency unit or seek placement at other facilities.  [name]   PMHNP-BC   ADDENDUM: Patient is now wanting to leave .She denies SI/HI/AVH and does not want to wait for admission bed anymore.  We will contact EDP for discharge.

## 2013-03-20 NOTE — Progress Notes (Signed)
Patient ID: Judy [name], female   DOB: Aug 13, 1980, 32 y.o.   MRN: [id number] Addendum:  Patient is d/c home at this time.. She has been ambivalence about the admission and quitting alcohol and cocaine.  Patient wanted to be discharged home to do out patient rehabilitation treatment.  EDP was informed.  Patient denies SI/HI/AVH.  Patient was alert, oriented and was discharged home.  Dr Tawni Carnes who was sitting next to writer agrees that patient can be discharged home. [name]   PMHNP-BC

## 2013-03-20 NOTE — Progress Notes (Signed)
Pt was interviewed with NP Josephine. Chart reviewed. Pt now claims that she did not request to be discharged. Pt is again requesting inpatient treatment for detox. Will cancel discharge, and pursue inpatient bed for alcohol detox.

## 2013-03-20 NOTE — ED Provider Notes (Addendum)
11:20 AM    Pt seen by behavioral health for detox from alcohol and cocaine. Patient has decided against detox at this time. We'll discharge. Patient was hypokalemic and had replacement in the ED. Her most recent potassium level was 3.2. We'll discharge with several days of 10 mEq potassium tablets. Patient also has urinary tract infection with Escherichia coli that is pansensitive. Will discharge home on Cipro. Will get outpatient followup information. No current safety concerns.   11:37 AM  Pt has changed her mind again and has decided to stay in the emergency department for placement for detox.   3:55 PM  Pt has decided to leave again and not seek detox.  She is here voluntarily.   Layla Maw Alleyne Lac, DO 03/20/13 1121  Layla Maw Aarna Mihalko, DO 03/20/13 1138  Layla Maw Courtnie Brenes, DO 03/20/13 1555

## 2013-03-20 NOTE — BHH Counselor (Addendum)
12:00 pm Pt has changed her mind and wants substance abuse inpatient treatment.    11:16 amWriter provided written resources to the pt who is requesting d/c. Pt denies SI, HI and AHVH. No delusions noted. Resources given were the following:  [facility]  Intensive Outpatient Programs [facility]    [facility] [address]     [address] [place],  [place]     [place], [place] [phone]      [phone] Both a day and evening program   *Accepts MCD  [facility]  [facility] treatment [facility] [address]     [address] [place], [place] [address] [phone]      [phone]  [facility]: Alcohol [facility] Drug Services    [facility] - Intensive Outpatient [address]     [address] [place], [place] [address]     [place], [place]  [phone] *Accepts MCD     [phone]  Residential Treatment Programs ASAP Residential Treatment    [facility] [address]     [address] [place], [place] [phone]      [phone] or [phone]  [facility]     The [address] (Several in [place]) [address], [place] [address]    [address] [place] [address]     [place], [place] [phone]      [phone]  [facility]   [facility] ([facility]) [address]     [address] [place], [place] [address]     [place], [place] [phone]      [phone] Admissions: 8am-3pm M-F  Self-Help/Support Groups [facility]   Narcotics Anonymous (NA) Variety of support groups    [facility] [phone] (call for more info)    [address]        [place] - 2 meetings at this location   [facility]: [facility] ACCESS LINE:  [phone]  or [phone] [address] [place],  [place] [address] [email or web address]   Treatment [facility] [address]  [place], [place] [address] [phone] (Tel) [phone] (Fax)   Mobile Crisis Teams [facility]    Assertive  Mobile Crisis Care Unit    [facility] [phone]     [address] [place], [place]        [phone]  [facility] [address] [place] [address] [place] [phone]   Residential & Outpatient Substance Abuse Program [facility] [address] [place] [phone]   Evette Cristal, [place] Assessment Counselor

## 2013-03-20 NOTE — Progress Notes (Signed)
   CARE MANAGEMENT ED NOTE 03/20/2013  Patient:  Judy [name], Judy [name]   Account Number:  [id number]  Date Initiated:  03/20/2013  Documentation initiated by:  Edd Arbour  Subjective/Objective Assessment:   32 yr old female medicaid [place] access pt states she has recently moved to [place] from [facility] and has not changed her medicaid services to [employer]     Subjective/Objective Assessment Detail:   Pt inquired about "what are we going to do?" and "when is lunch?" CM referred her back to her [facility] RN/CNA  Pt voiced understanding and appreciation of resources offered to her  Pt states she used the downtown [facility] when she resided in [place] as her pcp     Action/Plan:   CM reviewed EPIC noted no pcp nor scanned medicaid card in [place] for this medicaid pt CM spoke with pt and provided her a list of [facility] accepting providers & encouraged her to seek [place] [employer] DSS staff for assist   Action/Plan Detail:   Anticipated DC Date:       Status Recommendation to Physician:   Result of Recommendation:    Other ED Services  Consult Working Plan    DC [employer]  Other  PCP issues  Outpatient Services - Pt will follow up    Choice offered to / List presented to:            Status of service:  Completed, signed off  ED Comments:   ED Comments Detail:

## 2013-03-21 NOTE — ED Notes (Signed)
Post ED Visit - Positive Culture Follow-up  Culture report reviewed by antimicrobial stewardship pharmacist: []  Wes Dulaney, Pharm.D., BCPS []  Celedonio Miyamoto, [address].D., BCPS []  Georgina Pillion, Pharm.D., BCPS []  [place], [address].D., BCPS, AAHIVP []  Estella Husk, Pharm.D., BCPS, AAHIVP [x]  Harland German, Pharm.D., BCPS  Positive urine culture Treated with Cipro, organism sensitive to the same and no further patient follow-up is required at this time.  Marcelle Overlie, Jenel Lucks 03/21/2013, 11:51 AM

## 2013-05-13 ENCOUNTER — Emergency Department (HOSPITAL_COMMUNITY): Payer: MEDICAID

## 2013-05-13 ENCOUNTER — Encounter (HOSPITAL_COMMUNITY): Payer: Self-pay | Admitting: *Deleted

## 2013-05-13 ENCOUNTER — Emergency Department (EMERGENCY_DEPARTMENT_HOSPITAL)
Admission: EM | Admit: 2013-05-13 | Discharge: 2013-05-14 | Disposition: A | Discharge status: Other Acute Inpatient Hospital | Payer: MEDICAID | Source: Home / Self Care | Attending: Emergency Medicine | Admitting: Emergency Medicine

## 2013-05-13 DIAGNOSIS — F3289 Other specified depressive episodes: Secondary | ICD-10-CM | POA: Insufficient documentation

## 2013-05-13 DIAGNOSIS — E876 Hypokalemia: Secondary | ICD-10-CM

## 2013-05-13 DIAGNOSIS — F1420 Cocaine dependence, uncomplicated: Secondary | ICD-10-CM | POA: Diagnosis present

## 2013-05-13 DIAGNOSIS — F329 Major depressive disorder, single episode, unspecified: Secondary | ICD-10-CM

## 2013-05-13 DIAGNOSIS — F321 Major depressive disorder, single episode, moderate: Secondary | ICD-10-CM | POA: Diagnosis present

## 2013-05-13 DIAGNOSIS — F32A Depression, unspecified: Secondary | ICD-10-CM

## 2013-05-13 DIAGNOSIS — F1010 Alcohol abuse, uncomplicated: Secondary | ICD-10-CM | POA: Insufficient documentation

## 2013-05-13 DIAGNOSIS — F1020 Alcohol dependence, uncomplicated: Secondary | ICD-10-CM

## 2013-05-13 DIAGNOSIS — F10229 Alcohol dependence with intoxication, unspecified: Principal | ICD-10-CM | POA: Diagnosis present

## 2013-05-13 LAB — COMPREHENSIVE METABOLIC PANEL
ALT: 44 U/L — ABNORMAL HIGH (ref 0–35)
AST: 88 U/L — ABNORMAL HIGH (ref 0–37)
Albumin: 4 g/dL (ref 3.5–5.2)
Alkaline Phosphatase: 86 U/L (ref 39–117)
BUN: 11 mg/dL (ref 6–23)
CO2: 31 meq/L (ref 19–32)
CREATININE: 0.9 mg/dL (ref 0.50–1.10)
Calcium: 9.2 mg/dL (ref 8.4–10.5)
Chloride: 92 mEq/L — ABNORMAL LOW (ref 96–112)
GFR, EST NON AFRICAN AMERICAN: 83 mL/min — AB (ref >90)
GLUCOSE: 82 mg/dL (ref 70–99)
Potassium: 2.8 mEq/L — CL (ref 3.7–5.3)
Sodium: 140 mEq/L (ref 137–147)
TOTAL PROTEIN: 7.6 g/dL (ref 6.0–8.3)
Total Bilirubin: 0.3 mg/dL (ref 0.3–1.2)

## 2013-05-13 LAB — URINALYSIS, ROUTINE W REFLEX MICROSCOPIC
Bilirubin Urine: NEGATIVE
Glucose, UA: NEGATIVE mg/dL
Ketones, ur: 15 mg/dL — AB
Leukocytes, UA: NEGATIVE
NITRITE: NEGATIVE
PH: 7.5 (ref 5.0–8.0)
Protein, ur: 30 mg/dL — AB
Specific Gravity, Urine: 1.029 (ref 1.005–1.030)
Urobilinogen, UA: 1 mg/dL (ref 0.0–1.0)

## 2013-05-13 LAB — RAPID URINE DRUG SCREEN, HOSP PERFORMED
Amphetamines: NOT DETECTED
Barbiturates: NOT DETECTED
Benzodiazepines: NOT DETECTED
Cocaine: POSITIVE — AB
OPIATES: NOT DETECTED
TETRAHYDROCANNABINOL: NOT DETECTED

## 2013-05-13 LAB — URINE MICROSCOPIC-ADD ON

## 2013-05-13 LAB — CBC
HCT: 39.8 % (ref 36.0–46.0)
HEMOGLOBIN: 14.2 g/dL (ref 12.0–15.0)
MCH: 32.7 pg (ref 26.0–34.0)
MCHC: 35.7 g/dL (ref 30.0–36.0)
MCV: 91.7 fL (ref 78.0–100.0)
Platelets: 211 10*3/uL (ref 150–400)
RBC: 4.34 MIL/uL (ref 3.87–5.11)
RDW: 14.2 % (ref 11.5–15.5)
WBC: 6.4 10*3/uL (ref 4.0–10.5)

## 2013-05-13 LAB — POC URINE PREG, ED: Preg Test, Ur: NEGATIVE

## 2013-05-13 LAB — I-STAT TROPONIN, ED: Troponin i, poc: 0.01 ng/mL (ref 0.00–0.08)

## 2013-05-13 LAB — POTASSIUM: Potassium: 2.8 mEq/L — CL (ref 3.7–5.3)

## 2013-05-13 LAB — ETHANOL: ALCOHOL ETHYL (B): 226 mg/dL — AB (ref 0–11)

## 2013-05-13 LAB — SALICYLATE LEVEL

## 2013-05-13 LAB — ACETAMINOPHEN LEVEL: Acetaminophen (Tylenol), Serum: <15 ug/mL (ref 10–30)

## 2013-05-13 MED ORDER — ADULT MULTIVITAMIN W/MINERALS CH
1.0000 | ORAL_TABLET | Freq: Every day | ORAL | Status: DC
  Administered 2013-05-14: 1 via ORAL
  Filled 2013-05-13: qty 1

## 2013-05-13 MED ORDER — THIAMINE HCL 100 MG/ML IJ SOLN
100.0000 mg | Freq: Once | INTRAMUSCULAR | Status: AC
  Administered 2013-05-13: 100 mg via INTRAVENOUS

## 2013-05-13 MED ORDER — VITAMIN B-1 100 MG PO TABS
100.0000 mg | ORAL_TABLET | Freq: Every day | ORAL | Status: DC
  Administered 2013-05-14: 100 mg via ORAL
  Filled 2013-05-13: qty 1

## 2013-05-13 MED ORDER — METOCLOPRAMIDE HCL 5 MG/ML IJ SOLN
10.0000 mg | Freq: Once | INTRAMUSCULAR | Status: AC
  Administered 2013-05-13: 10 mg via INTRAVENOUS
  Filled 2013-05-13: qty 2

## 2013-05-13 MED ORDER — THIAMINE HCL 100 MG/ML IJ SOLN
100.0000 mg | Freq: Once | INTRAMUSCULAR | Status: DC
  Filled 2013-05-13: qty 2

## 2013-05-13 MED ORDER — LOPERAMIDE HCL 2 MG PO CAPS: 2.0000 mg | ORAL_CAPSULE | ORAL | Status: DC | PRN

## 2013-05-13 MED ORDER — CHLORDIAZEPOXIDE HCL 25 MG PO CAPS: 25.0000 mg | ORAL_CAPSULE | Freq: Four times a day (QID) | ORAL | Status: DC | PRN

## 2013-05-13 MED ORDER — POTASSIUM CHLORIDE CRYS ER 20 MEQ PO TBCR
40.0000 meq | EXTENDED_RELEASE_TABLET | Freq: Once | ORAL | Status: AC
  Administered 2013-05-13: 40 meq via ORAL
  Filled 2013-05-13: qty 2

## 2013-05-13 MED ORDER — HYDROXYZINE HCL 25 MG PO TABS: 25.0000 mg | ORAL_TABLET | Freq: Four times a day (QID) | ORAL | Status: DC | PRN

## 2013-05-13 MED ORDER — SODIUM CHLORIDE 0.9 % IV BOLUS (SEPSIS)
1000.0000 mL | Freq: Once | INTRAVENOUS | Status: AC
  Administered 2013-05-13: 1000 mL via INTRAVENOUS

## 2013-05-13 MED ORDER — POTASSIUM CHLORIDE 10 MEQ/100ML IV SOLN
10.0000 meq | Freq: Once | INTRAVENOUS | Status: AC
  Administered 2013-05-13: 10 meq via INTRAVENOUS
  Filled 2013-05-13: qty 100

## 2013-05-13 NOTE — ED Provider Notes (Signed)
CSN: [id number]     Arrival date & time 05/13/13  1624 History   First MD Initiated Contact with Patient 05/13/13 1716     Chief Complaint  Patient presents with  . Medical Clearance     (Consider location/radiation/quality/duration/timing/severity/associated sxs/prior Treatment) HPI This 33 year old female with history of alcoholism and cocaine abuse as well as anxiety and depression presents requesting detox from alcohol and cocaine, she states for the last week she is a constant 24-hour a day left lower rib cage chest wall type pain worse with palpation and position changes without cough without fever without shortness of breath without exertional pain without abdominal pain today in the emergency room triage area the patient felt generally weak as if she might faint. She felt like she needed to use more cocaine and fell down onto the floor hitting her head causing some head pain and neck pain without witnessed loss of consciousness she also however has new back pain since her fall in the emergency department waiting room as well without new focal or lateralizing weakness or numbness or incoordination; she is anxious and depressed but denies suicidal or homicidal ideation or hallucinations. She also abuses other street drugs including ecstasy. Past Medical History  Diagnosis Date  . Depression   . Cocaine abuse   . Polysubstance abuse   . Anxiety   . Depression   . Alcohol abuse    No past surgical history on file. No family history on file. History  Substance Use Topics  . Smoking status: Current Every Day Smoker -- 0.25 packs/day for 10 years    Types: Cigarettes  . Smokeless tobacco: Not on file  . Alcohol Use: Yes     Comment: drinks vodka daily. Unable to articulate amount.   OB History   Grav Para Term Preterm Abortions TAB SAB Ect Mult Living                 Review of Systems 10 Systems reviewed and are negative for acute change except as noted in the  HPI.   Allergies  Cephalosporins; Penicillins; Sulfa antibiotics; and Zofran  Home Medications   No current outpatient prescriptions on file. BP 109/75  Pulse 77  Temp(Src) 97.2 F (36.2 C) (Oral)  Resp 16  SpO2 97% Physical Exam  Nursing note and vitals reviewed. Constitutional: She is oriented to person, place, and time.  Awake, alert, nontoxic appearance with baseline speech for patient.  HENT:  Head: Atraumatic.  Mouth/Throat: No oropharyngeal exudate.  Eyes: EOM are normal. Pupils are equal, round, and reactive to light. Right eye exhibits no discharge. Left eye exhibits no discharge.  Neck: Neck supple.  Mild diffuse cervical spine tenderness without deformity  Cardiovascular: Normal rate and regular rhythm.   No murmur heard. Pulmonary/Chest: Effort normal and breath sounds normal. No stridor. No respiratory distress. She has no wheezes. She has no rales. She exhibits tenderness.  Reproducible left lower chest wall tenderness without deformity palpated  Abdominal: Soft. Bowel sounds are normal. She exhibits no mass. There is no tenderness. There is no rebound.  Musculoskeletal: She exhibits tenderness. She exhibits no edema.  Baseline ROM, moves extremities with no obvious new focal weakness. Mild diffuse midline back tenderness without deformity palpated.  Lymphadenopathy:    She has no cervical adenopathy.  Neurological: She is alert and oriented to person, place, and time.  Awake, alert, anxious but cooperative and aware of situation; motor strength 5/5 bilaterally; sensation normal to light touch bilaterally; peripheral visual fields  full to confrontation; no facial asymmetry; tongue midline; major cranial nerves appear intact; no pronator drift, normal finger to nose bilaterally  Skin: No rash noted.  Psychiatric: She has a normal mood and affect.    ED Course  Procedures (including critical care time) PERC negative. Doubt ACS/PE. Patient understand and agree  with initial ED impression and plan with expectations set for ED visit. D/w TTS for Psych eval. Repleting potassium. Labs Review Labs Reviewed  COMPREHENSIVE METABOLIC PANEL - Abnormal; Notable for the following:    Potassium 2.8 (*)    Chloride 92 (*)    AST 88 (*)    ALT 44 (*)    GFR calc non Af Amer 83 (*)    All other components within normal limits  ETHANOL - Abnormal; Notable for the following:    Alcohol, Ethyl (B) 226 (*)    All other components within normal limits  SALICYLATE LEVEL - Abnormal; Notable for the following:    Salicylate Lvl <2.0 (*)    All other components within normal limits  URINE RAPID DRUG SCREEN (HOSP PERFORMED) - Abnormal; Notable for the following:    Cocaine POSITIVE (*)    All other components within normal limits  URINALYSIS, ROUTINE W REFLEX MICROSCOPIC - Abnormal; Notable for the following:    Color, Urine AMBER (*)    APPearance CLOUDY (*)    Hgb urine dipstick LARGE (*)    Ketones, ur 15 (*)    Protein, ur 30 (*)    All other components within normal limits  POTASSIUM - Abnormal; Notable for the following:    Potassium 2.8 (*)    All other components within normal limits  URINE MICROSCOPIC-ADD ON - Abnormal; Notable for the following:    Squamous Epithelial / LPF FEW (*)    Bacteria, UA FEW (*)    All other components within normal limits  ACETAMINOPHEN LEVEL  CBC  POTASSIUM  I-STAT TROPOININ, ED  POC URINE PREG, ED   Imaging Review No results found.  EKG Interpretation    Date/Time:  Saturday May 13 2013 17:35:42 EST Ventricular Rate:  73 PR Interval:  122 QRS Duration: 94 QT Interval:  432 QTC Calculation: 476 R Axis:   90 Text Interpretation:  Sinus rhythm Borderline right axis deviation Minimal ST depression, inferior leads Borderline prolonged QT interval No previous ECGs available Confirmed by [facility]  MD, Jonny Ruiz [phone]) on 05/13/2013 5:47:47 PM            MDM   Final diagnoses:  Alcohol dependence   Cocaine dependence  Depressive disorder  Hypokalemia    Dispo pending.    Hurman Horn, MD 05/16/13 [phone]

## 2013-05-13 NOTE — ED Notes (Signed)
Tele-psych at bedside.

## 2013-05-13 NOTE — ED Notes (Signed)
Pt reporting pain to left arm and back of head.

## 2013-05-13 NOTE — ED Notes (Signed)
Pt here for detox for cocaine and etoh.  Pt got upset about wait upon arrival and had a witnessed fall by the bathroom.  Pt c/o pain to the back of her head.  There was reported seizure-like activity.  Pt responsive to ammonia capsule.  Pt stating that she does not want to help herself.  Reports taking $2,000 worth of cocaine.  Last reported cocaine and etoh use was 7am this morning.  C-collar in place.

## 2013-05-13 NOTE — BHH Counselor (Signed)
Per [name], NP, pt will need to be seen by psychiatry in the AM for finalAlberteen [name] disposition.  [facility] eval.  [facility]  The last time pt presented for detox; [facility] because she presents multiple times at both campuses.

## 2013-05-13 NOTE — ED Notes (Signed)
Pt at bedside throwing up after consuming 3 Malawiturkey sandwiches.  Half a bag of emesis filled up.  EDP made aware.  Nausea medication to be ordered.

## 2013-05-13 NOTE — BH Assessment (Signed)
Assessment Note  Judy [name] is a 33 y.o. female who presents to [facility] for alcohol/opiate/cocaine detox. Pt denies SI/HI/AVH.  Pt is visibly intoxicated but she is able to provide a reasonable hx.  Pt reports the following: she drinks 1/5 of liquor, 3-24oz cans of margaritas, 1/2 pint of liquor and 2 "airplane bottles" of gin and 3 screwdrivers, consuming all beverages daily.  Pt states last use was 05/13/13.  Pt uses $400 worth of cocaine, daily, last use was 05/13/13.  Pt also ingests 2 hydrocodone pills, daily, last use was 2 days ago.  Pt is crying during interview, stating--"I can't stop using" and she reports increasing depressive sxs due to SA and other stressor: daughter was adopted at age 27 because of pt drug use. Pt admits she funds her drug use from a disability check(she has a payee), deceased father's insurance policy(she has a payee) and her boyfriend provides money to her when she requests it.  Pt.'s current w/d sxs are nausea, she denies any issues with seizures or blackouts. Pt has an upcoming court date on 05/17/13 for an assault charge.            Axis I: Alcohol use disorder, Severe; Cocaine use disorder, Severe; Opioid use disorder, Severe Axis II: Deferred Axis III:  Past Medical History  Diagnosis Date  . Depression   . Cocaine abuse   . Polysubstance abuse   . Anxiety   . Depression   . Alcohol abuse    Axis IV: other psychosocial or environmental problems, problems related to legal system/crime, problems related to social environment and problems with primary support group Axis V: 41-50 serious symptoms  Past Medical History:  Past Medical History  Diagnosis Date  . Depression   . Cocaine abuse   . Polysubstance abuse   . Anxiety   . Depression   . Alcohol abuse     No past surgical history on file.  Family History: No family history on file.  Social History:  reports that she has been smoking Cigarettes.  She has a 2.5 pack-year smoking history.  She does not have any smokeless tobacco history on file. She reports that she drinks alcohol. She reports that she uses illicit drugs (Cocaine and "Crack" cocaine).  Additional Social History:  Alcohol / Drug Use Pain Medications: See MAR  Prescriptions: See MAR  Over the Counter: See MAr  Longest period of sobriety (when/how long): None  Negative Consequences of Use: Work / Web designer relationships;Legal;Financial Withdrawal Symptoms: Nausea / Vomiting Substance #1 Name of Substance 1: Alcohol  1 - Age of First Use: 14 YOF  1 - Amount (size/oz): 1/5; 3-24oz cans of Margarita; 1/2 pint; 2 "airplane bottles" of Gin  1 - Frequency: Daily  1 - Duration: On-going  1 - Last Use / Amount: 05/13/13 Substance #2 Name of Substance 2: Crack Cocaine  2 - Age of First Use: 17 YOF  2 - Amount (size/oz): $400 2 - Frequency: Daily  2 - Duration: On-going  2 - Last Use / Amount: 05/13/13  CIWA: CIWA-Ar BP: 118/74 mmHg Pulse Rate: 71 COWS:    Allergies:  Allergies  Allergen Reactions  . Cephalosporins Hives  . Penicillins Hives  . Zofran [Ondansetron Hcl] Nausea And Vomiting    Home Medications:  (Not in a hospital admission)  OB/GYN Status:  No LMP recorded.  General Assessment Data Location of Assessment: [facility] ED Is this a Tele or Face-to-Face Assessment?: Tele Assessment Is this an Initial Assessment or  a Re-assessment for this encounter?: Initial Assessment Living Arrangements: Other (Comment);Non-relatives/Friends (Lives with boyfriend ) Can pt return to current living arrangement?: Yes Admission Status: Voluntary Is patient capable of signing voluntary admission?: Yes Transfer from: Acute Hospital Referral Source: MD  Medical Screening Exam [facility] Walk-in ONLY) Medical Exam completed: No Reason for MSE not completed: Other: (None )  [facility] Crisis Care Plan Living Arrangements: Other (Comment);Non-relatives/Friends (Lives with boyfriend ) Name of Psychiatrist: None  Name  of Therapist: None   Education Status Is patient currently in school?: No Current Grade: None  Highest grade of school patient has completed: None  Name of school: None  Contact person: None   Risk to self Suicidal Ideation: No Suicidal Intent: No Is patient at risk for suicide?: No Suicidal Plan?: No Access to Means: No What has been your use of drugs/alcohol within the last 12 months?: Abusing: alcohol, cocaine  Previous Attempts/Gestures: No How many times?: 0 Other Self Harm Risks: None  Triggers for Past Attempts: None known Intentional Self Injurious Behavior: None Family Suicide History: No Recent stressful life event(s): Legal Issues;Other (Comment) (Lost daughter at age 46 to adoption; SA) Persecutory voices/beliefs?: No Depression: Yes Depression Symptoms: Tearfulness;Loss of interest in usual pleasures;Feeling worthless/self pity;Guilt Substance abuse history and/or treatment for substance abuse?: Yes Suicide prevention information given to non-admitted patients: Not applicable  Risk to Others Homicidal Ideation: No Thoughts of Harm to Others: No Current Homicidal Intent: No Current Homicidal Plan: No Access to Homicidal Means: No Identified Victim: None  History of harm to others?: No Assessment of Violence: None Noted Violent Behavior Description: None  Does patient have access to weapons?: No Criminal Charges Pending?: Yes Describe Pending Criminal Charges: Assault  Does patient have a court date: Yes Court Date: 05/17/13  Psychosis Hallucinations: None noted Delusions: None noted  Mental Status Report Appear/Hygiene: Disheveled;Poor hygiene Eye Contact: Poor Motor Activity: Unremarkable Speech: Logical/coherent;Pressured;Loud Level of Consciousness: Crying;Alert Mood: Depressed Affect: Depressed;Appropriate to circumstance Anxiety Level: None Thought Processes: Coherent;Relevant Judgement: Unimpaired Orientation:  Person;Place;Time;Situation Obsessive Compulsive Thoughts/Behaviors: None  Cognitive Functioning Concentration: Decreased Memory: Recent Intact;Remote Intact IQ: Average Insight: Fair Impulse Control: Fair Appetite: Poor Weight Loss: 60 Weight Gain: 0 Sleep: Decreased Total Hours of Sleep: 4 Vegetative Symptoms: None  ADLScreening [facility] Assessment Services) Patient's cognitive ability adequate to safely complete daily activities?: Yes Patient able to express need for assistance with ADLs?: Yes Independently performs ADLs?: Yes (appropriate for developmental age)  Prior Inpatient Therapy Prior Inpatient Therapy: Yes Prior Therapy Dates: 2014; various other dates  Prior Therapy Facilty/Provider(s): [facility], [facility], [facility]  Reason for Treatment: Detox/Rehab   Prior Outpatient Therapy Prior Outpatient Therapy: No Prior Therapy Dates: None  Prior Therapy Facilty/Provider(s): None  Reason for Treatment: None   ADL Screening (condition at time of admission) Patient's cognitive ability adequate to safely complete daily activities?: Yes Is the patient deaf or have difficulty hearing?: No Does the patient have difficulty seeing, even when wearing glasses/contacts?: No Does the patient have difficulty concentrating, remembering, or making decisions?: Yes Patient able to express need for assistance with ADLs?: Yes Does the patient have difficulty dressing or bathing?: No Independently performs ADLs?: Yes (appropriate for developmental age) Does the patient have difficulty walking or climbing stairs?: No Weakness of Legs: None Weakness of Arms/Hands: None  Home Assistive Devices/Equipment Home Assistive Devices/Equipment: None  Therapy Consults (therapy consults require a physician order) PT Evaluation Needed: No OT Evalulation Needed: No SLP Evaluation Needed: No Abuse/Neglect Assessment (Assessment to be complete  while patient is alone) Physical Abuse: Denies Verbal  Abuse: Yes, past (Comment) (Reports emotional abuse ) Sexual Abuse: Denies Exploitation of patient/patient's resources: Denies Self-Neglect: Denies Values / Beliefs Cultural Requests During Hospitalization: None Spiritual Requests During Hospitalization: None Consults Spiritual Care Consult Needed: No Social Work Consult Needed: No Merchant navy officerAdvance Directives (For Healthcare) Advance Directive: Patient does not have advance directive;Patient would not like information Pre-existing out of facility DNR order (yellow form or pink MOST form): No Nutrition Screen- [facility] Adult/[facility] Patient's home diet: Regular  Additional Information 1:1 In Past 12 Months?: No CIRT Risk: No Elopement Risk: No Does patient have medical clearance?: Yes     Disposition:  Disposition Initial Assessment Completed for this Encounter: Yes Disposition of Patient: Inpatient treatment program;[facility]erred to [facility] ) Type of inpatient treatment program: Adult Patient referred to: Other (Comment) [facility] )  On Site Evaluation by:   Reviewed with Physician:    Murrell ReddenSimmons, Raneshia Derick C 05/13/2013 8:51 PM

## 2013-05-13 NOTE — ED Notes (Signed)
Pt with 10-15 seconds of unresponsiveness with RN at bedside.  Pt twitching.  Responsive to ammonia capsule.  Pt disoriented initially.  Currently responsive.

## 2013-05-13 NOTE — [facility] Counselor (Signed)
This Clinical research associatewriter contacted all parties involved: Yolanda Miller(RN, [facility] psych ed), Paul(RN, [facility]) and Ginny ForthMartha Gustavsen(Charge Nurse, [facility]) to inform them of disposition.  However, pt is still not medically cleared, she has k-level 2.8.  She was given potassium and 2nd k-level draw reflects same level.  Pt is now be administered IV potassium and another k-level will be drawn.  When pt's k-level is therapeutic, [facility].

## 2013-05-13 NOTE — ED Notes (Signed)
Patient returned from XR and CT. 

## 2013-05-13 NOTE — ED Notes (Signed)
Bednar at bedside. 

## 2013-05-13 NOTE — ED Notes (Signed)
Pt stating "I have a dead eye".  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

## 2013-05-13 NOTE — ED Notes (Signed)
Per significant other, pt has hx of bipolar and schizophrenia.  "She is very manipulative and that is what you are seeing now.  She is frustrated and is not thinking reasonably."  Second Malawiturkey sandwich given to pt.  Sitter at bedside after significant other left.  This RN asked pt to let her know when she was ready for her second potassium pill.  Pt stated she would.   Nad.

## 2013-05-13 NOTE — ED Notes (Signed)
Pt refusing to provide urine sample.

## 2013-05-13 NOTE — ED Notes (Signed)
Pt at bedside yelling at staff and significant other.  Pt visibly frustrated and upset.  Stating "y'all are not doing anything for me.  I am withdrawing and I have not gotten any medicine in the IV.  Y'all are treating me different because I have used IV drugs.  I have a right to be treated.  I want another nurse cause you are not doing anything for me."  Charge RN made aware of situation.  At bedside talking to pt.  Bednar made aware of situation.  Will come talk to pt when he has a moment.

## 2013-05-13 NOTE — ED Notes (Signed)
Candy, AC notified of sitter.

## 2013-05-14 ENCOUNTER — Inpatient Hospital Stay (HOSPITAL_COMMUNITY)
Admission: AD | Admit: 2013-05-14 | Discharge: 2013-05-17 | DRG: 897 | Disposition: A | Discharge status: Home / Self Care | Payer: MEDICAID | Source: Intra-hospital | Attending: Psychiatry | Admitting: Psychiatry

## 2013-05-14 ENCOUNTER — Encounter (HOSPITAL_COMMUNITY): Payer: Self-pay

## 2013-05-14 DIAGNOSIS — F32A Depression, unspecified: Secondary | ICD-10-CM

## 2013-05-14 DIAGNOSIS — F329 Major depressive disorder, single episode, unspecified: Secondary | ICD-10-CM

## 2013-05-14 DIAGNOSIS — F1020 Alcohol dependence, uncomplicated: Secondary | ICD-10-CM

## 2013-05-14 DIAGNOSIS — F1420 Cocaine dependence, uncomplicated: Secondary | ICD-10-CM

## 2013-05-14 LAB — URINALYSIS, ROUTINE W REFLEX MICROSCOPIC
Bilirubin Urine: NEGATIVE
GLUCOSE, UA: NEGATIVE mg/dL
Ketones, ur: NEGATIVE mg/dL
Nitrite: NEGATIVE
PROTEIN: 30 mg/dL — AB
Specific Gravity, Urine: 1.021 (ref 1.005–1.030)
Urobilinogen, UA: 1 mg/dL (ref 0.0–1.0)
pH: 8.5 — ABNORMAL HIGH (ref 5.0–8.0)

## 2013-05-14 LAB — URINE MICROSCOPIC-ADD ON

## 2013-05-14 LAB — POTASSIUM: Potassium: 3.9 mEq/L (ref 3.7–5.3)

## 2013-05-14 MED ORDER — CHLORDIAZEPOXIDE HCL 25 MG PO CAPS: 25.0000 mg | ORAL_CAPSULE | Freq: Four times a day (QID) | ORAL | Status: DC | PRN

## 2013-05-14 MED ORDER — HYDROXYZINE HCL 25 MG PO TABS
25.0000 mg | ORAL_TABLET | Freq: Four times a day (QID) | ORAL | Status: DC | PRN
  Administered 2013-05-14: 25 mg via ORAL
  Filled 2013-05-14: qty 1

## 2013-05-14 MED ORDER — ACETAMINOPHEN 325 MG PO TABS
650.0000 mg | ORAL_TABLET | Freq: Four times a day (QID) | ORAL | Status: DC | PRN
  Administered 2013-05-14 – 2013-05-17 (×4): 650 mg via ORAL
  Filled 2013-05-14 (×4): qty 2

## 2013-05-14 MED ORDER — AMANTADINE HCL 100 MG PO CAPS
100.0000 mg | ORAL_CAPSULE | Freq: Two times a day (BID) | ORAL | Status: DC
  Administered 2013-05-14 – 2013-05-16 (×4): 100 mg via ORAL
  Filled 2013-05-14 (×11): qty 1

## 2013-05-14 MED ORDER — CHLORDIAZEPOXIDE HCL 25 MG PO CAPS
25.0000 mg | ORAL_CAPSULE | Freq: Three times a day (TID) | ORAL | Status: AC
  Administered 2013-05-15 – 2013-05-16 (×3): 25 mg via ORAL
  Filled 2013-05-14 (×3): qty 1

## 2013-05-14 MED ORDER — ADULT MULTIVITAMIN W/MINERALS CH
1.0000 | ORAL_TABLET | Freq: Every day | ORAL | Status: DC
  Filled 2013-05-14 (×5): qty 1

## 2013-05-14 MED ORDER — CHLORDIAZEPOXIDE HCL 25 MG PO CAPS
25.0000 mg | ORAL_CAPSULE | Freq: Four times a day (QID) | ORAL | Status: DC | PRN
  Administered 2013-05-14: 25 mg via ORAL
  Filled 2013-05-14: qty 1

## 2013-05-14 MED ORDER — CHLORDIAZEPOXIDE HCL 25 MG PO CAPS
25.0000 mg | ORAL_CAPSULE | ORAL | Status: DC
  Filled 2013-05-14: qty 1

## 2013-05-14 MED ORDER — CHLORDIAZEPOXIDE HCL 25 MG PO CAPS: 25.0000 mg | ORAL_CAPSULE | ORAL | Status: DC

## 2013-05-14 MED ORDER — MAGNESIUM HYDROXIDE 400 MG/5ML PO SUSP: 30.0000 mL | Freq: Every day | ORAL | Status: DC | PRN

## 2013-05-14 MED ORDER — CHLORDIAZEPOXIDE HCL 25 MG PO CAPS
25.0000 mg | ORAL_CAPSULE | Freq: Four times a day (QID) | ORAL | Status: AC
  Administered 2013-05-14 – 2013-05-15 (×4): 25 mg via ORAL
  Filled 2013-05-14 (×4): qty 1

## 2013-05-14 MED ORDER — CHLORDIAZEPOXIDE HCL 25 MG PO CAPS: 25.0000 mg | ORAL_CAPSULE | Freq: Every day | ORAL | Status: DC

## 2013-05-14 MED ORDER — CHLORDIAZEPOXIDE HCL 25 MG PO CAPS
25.0000 mg | ORAL_CAPSULE | Freq: Four times a day (QID) | ORAL | Status: DC
  Administered 2013-05-14 (×2): 25 mg via ORAL
  Filled 2013-05-14 (×2): qty 1

## 2013-05-14 MED ORDER — AMANTADINE HCL 100 MG PO CAPS
100.0000 mg | ORAL_CAPSULE | Freq: Two times a day (BID) | ORAL | Status: DC
  Administered 2013-05-14: 100 mg via ORAL
  Filled 2013-05-14 (×2): qty 1

## 2013-05-14 MED ORDER — LOPERAMIDE HCL 2 MG PO CAPS: 2.0000 mg | ORAL_CAPSULE | ORAL | Status: DC | PRN

## 2013-05-14 MED ORDER — VITAMIN B-1 100 MG PO TABS
100.0000 mg | ORAL_TABLET | Freq: Every day | ORAL | Status: DC
  Administered 2013-05-15: 100 mg via ORAL
  Filled 2013-05-14 (×5): qty 1

## 2013-05-14 MED ORDER — CHLORDIAZEPOXIDE HCL 25 MG PO CAPS: 25.0000 mg | ORAL_CAPSULE | Freq: Three times a day (TID) | ORAL | Status: DC

## 2013-05-14 MED ORDER — ALUM & MAG HYDROXIDE-SIMETH 200-200-20 MG/5ML PO SUSP: 30.0000 mL | ORAL | Status: DC | PRN

## 2013-05-14 NOTE — ED Provider Notes (Addendum)
2:46 AM potassium 3.9, is medically cleared and appropriate for psych admit  Judy NielsenBrian Hulan Szumski, MD 05/14/13 0246  [facility] by psychiatrist. Discussed with Dr. Hyacinth MeekerMiller who accepts patient in transfer.  Judy NielsenBrian Bastion Bolger, MD 05/14/13 [phone]

## 2013-05-14 NOTE — Progress Notes (Signed)
As requested, [facility] spoke with pt on [facility] to psychiatry in [facility] morning after labs stabilized.  Blain PaisMichelle L Ashea Winiarski, MHT/NS

## 2013-05-14 NOTE — Progress Notes (Signed)
Patient did attend the last twenty minutes of the evening speaker AA meeting.

## 2013-05-14 NOTE — ED Notes (Signed)
Pt currently asleep; no s/s of distress noted. Respirations regular and unlabored. 

## 2013-05-14 NOTE — Progress Notes (Signed)
Voluntary admission for a 33 year old female with flat affect, depressed mood who reports "I am tired of being sick and tired" requesting alcohol detox.  Reports drinking 1 fifth of liquor and  3-24 oz cans of margaritas daily along with use of $400.00 worth of cocaine.  Last use 05/13/13.  Pt. Denies SI/HI and denies A/V hallucinations.  Reports legal issues with a court date 05/17/13.  Pt. Given and consumed food and fluids.  Oriented to unit.  Pt. Remains safe.

## 2013-05-14 NOTE — ED Notes (Signed)
Pt pleasant and cooperative with care; no s/s of distress noted at this time.

## 2013-05-14 NOTE — Tx Team (Signed)
Initial Interdisciplinary Treatment Plan  PATIENT STRENGTHS: (choose at least two) Ability for insight Average or above average intelligence Motivation for treatment/growth  PATIENT STRESSORS: Financial difficulties Substance abuse   PROBLEM LIST: Problem List/Patient Goals Date to be addressed Date deferred Reason deferred Estimated date of resolution  Goal-"try to love myself" 05/14/13     "Being a productive member of Society 05/14/13     Substance Abuse 05/14/13                                          DISCHARGE CRITERIA:  Improved stabilization in mood, thinking, and/or behavior Motivation to continue treatment in a less acute level of care Verbal commitment to aftercare and medication compliance Withdrawal symptoms are absent or subacute and managed without 24-hour nursing intervention  PRELIMINARY DISCHARGE PLAN: Attend 12-step recovery group  PATIENT/FAMIILY INVOLVEMENT: This treatment plan has been presented to and reviewed with the patient, Alger SimonsRenisha Shonte Copeman, and/or family member,  The patient and family have been given the opportunity to ask questions and make suggestions.  Nikola Blackston Dawkins 05/14/2013, 4:04 PM

## 2013-05-14 NOTE — Progress Notes (Signed)
[facility] via Dr. Gilmore LarocheAkhtar and will go to room 301-2.  Pt to go by [employer].  Nurse to call report [phone].  [facility] admit [facility] work [facility].  Nurse to send original info.

## 2013-05-14 NOTE — ED Notes (Signed)
Judy [name] from Behavior Health inform patient for the current plans to [facility] to be evaulated by psychiatry as soon as patient is medically cleared.

## 2013-05-14 NOTE — ED Notes (Signed)
Per Debbe BalesBrook, RN pt to be sent after 1330.

## 2013-05-14 NOTE — Progress Notes (Signed)
D.  Pt pleasant on approach, did not attend evening AA group as this is her first night.  Denies SI/HI/hallucinations at this time.  Interacting appropriately with peers on unit.  Pt did request sandwich this evening and this writer got [facility] one due to the fact that she is a new admission, however learned later that Pt had indeed gone to dinner this evening and makes these requests each time she is admitted.  A.  Explained to Pt that sandwich tray was given because she is newly admitted but that it is not common practice on unit otherwise.  Support and encouragement offered, medication given as ordered  R.  Pt verbalized understanding.  Pt remains safe on unit, will continue to monitor.

## 2013-05-14 NOTE — Consult Note (Signed)
Judy [name] Psychiatry Consult   Reason for Consult:  ALCOHOL [name], Judy [name] [name] is an 33 y.o. female. Total Time spent with patient: 45 minutes  Assessment: AXIS I:  Alcohol [name], Judy [name] [name], Depressive d/o AXIS II:  Deferred AXIS III:   Past Medical History  Diagnosis Date  . Depression   . Judy [name] abuse   . Polysubstance abuse   . Anxiety   . Depression   . Alcohol abuse    AXIS IV:  other psychosocial or environmental problems, problems related to legal system/crime, problems related to social environment and problems with primary support group AXIS V:  41-50 serious symptoms  Plan:  Recommend psychiatric Inpatient admission when medically cleared.  Subjective:   Judy [name] [name] is a 33 y.o. female patient is evaluated for alcohol,Judy [name] [name] and depressive d/o.  HPI:  AA female transferred in from [place]  seeking detox treatment from alcohol and Judy [name]. Patient was seen and discharged here back in December 2014 for alcohol intoxication.  At the time patient refused admission and continued  Drinking and using Judy [name].  Patient states she spends $400 dollars a day and drinking "a lot of alcohol"  Patient states she uses alcohol and Judy [name] to treat her depression.  She reports only sleeping well after drinking.  She states she drinks to "numb her feelings"  Patient denies SI/HI/AVH.  She reports her withdrawal symptoms includes nausea, tremors, vomiting and anxiety.  She reports poor appetite and feels hopeless, helpless and worthless.  Patient states" This time, I am ready to clean up myself, I am ready to be better"  We have accepted patient to our Chemical dependency unit and will be transporting her soon. HPI Elements:   Location:  Alcohol intoxication, Judy [name] [name] and Depression. Quality:  severe, spending money, legal court date and health consequences. Severity:   severe. Context:  Relapse, excessive drinking and Judy [name] use.  Past Psychiatric History: Past Medical History  Diagnosis Date  . Depression   . Judy [name] abuse   . Polysubstance abuse   . Anxiety   . Depression   . Alcohol abuse     reports that she has been smoking Cigarettes.  She has a 2.5 pack-year smoking history. She does not have any smokeless tobacco history on file. She reports that she drinks alcohol. She reports that she uses illicit drugs (Judy [name] and "Crack" Judy [name]). No family history on file. Family History Substance Abuse: Yes, Describe: (Various family members ) Family Supports: No (None reported ) Living Arrangements: Other (Comment);Non-relatives/Friends (Lives with boyfriend ) Can pt return to current living arrangement?: Yes Abuse/Neglect [facility]) Physical Abuse: Denies Verbal Abuse: Yes, past (Comment) (Reports emotional abuse ) Sexual Abuse: Denies Allergies:   Allergies  Allergen Reactions  . Cephalosporins Hives  . Penicillins Hives  . Zofran [Ondansetron Hcl] Nausea And Vomiting    ACT Assessment Complete:  Yes:    Educational Status    Risk to Self: Risk to self Suicidal Ideation: No Suicidal Intent: No Is patient at risk for suicide?: No Suicidal Plan?: No Access to Means: No What has been your use of drugs/alcohol within the last 12 months?: Abusing: alcohol, Judy [name]  Previous Attempts/Gestures: No How many times?: 0 Other Self Harm Risks: None  Triggers for Past Attempts: None known Intentional Self Injurious Behavior: None Family Suicide History: No Recent stressful life event(s): Legal Issues;Other (Comment) (Lost daughter at age 29 to adoption; SA) Persecutory voices/beliefs?: No Depression: Yes Depression  Symptoms: Tearfulness;Loss of interest in usual pleasures;Feeling worthless/self pity;Guilt Substance abuse history and/or treatment for substance abuse?: Yes Suicide prevention information given to non-admitted patients: Not  applicable  Risk to Others: Risk to Others Homicidal Ideation: No Thoughts of Harm to Others: No Current Homicidal Intent: No Current Homicidal Plan: No Access to Homicidal Means: No Identified Victim: None  History of harm to others?: No Assessment of Violence: None Noted Violent Behavior Description: None  Does patient have access to weapons?: No Criminal Charges Pending?: Yes Describe Pending Criminal Charges: Assault  Does patient have a court date: Yes Court Date: 05/17/13  Abuse: Abuse/Neglect Assessment (Assessment to be complete while patient is alone) Physical Abuse: Denies Verbal Abuse: Yes, past (Comment) (Reports emotional abuse ) Sexual Abuse: Denies Exploitation of patient/patient's resources: Denies Self-Neglect: Denies  Prior Inpatient Therapy: Prior Inpatient Therapy Prior Inpatient Therapy: Yes Prior Therapy Dates: 2014; various other dates  Prior Therapy Facilty/Provider(s): [facility], [facility], [facility], [facility]  Reason for Treatment: Detox/Rehab   Prior Outpatient Therapy: Prior Outpatient Therapy Prior Outpatient Therapy: No Prior Therapy Dates: None  Prior Therapy Facilty/Provider(s): None  Reason for Treatment: None   Additional Information: Additional Information 1:1 In Past 12 Months?: No CIRT Risk: No Elopement Risk: No Does patient have medical clearance?: Yes                  Objective: Blood pressure 110/70, pulse 60, temperature 98 F (36.7 C), temperature source Oral, resp. rate 20, SpO2 95.00%.There is no weight on file to calculate BMI. Results for orders placed during the hospital encounter of 05/13/13 (from the past 72 hour(s))  ACETAMINOPHEN LEVEL     Status: None   Collection Time    05/13/13  5:15 PM      Result Value Ref Range   Acetaminophen (Tylenol), Serum <15.0  10 - 30 ug/mL   Comment:            THERAPEUTIC CONCENTRATIONS VARY     SIGNIFICANTLY. A RANGE OF 10-30     ug/mL MAY BE AN EFFECTIVE     CONCENTRATION FOR  MANY PATIENTS.     HOWEVER, SOME ARE BEST TREATED     AT CONCENTRATIONS OUTSIDE THIS     RANGE.     ACETAMINOPHEN CONCENTRATIONS     >150 ug/mL AT 4 HOURS AFTER     INGESTION AND >50 ug/mL AT 12     HOURS AFTER INGESTION ARE     OFTEN ASSOCIATED WITH TOXIC     REACTIONS.  CBC     Status: None   Collection Time    05/13/13  5:15 PM      Result Value Ref Range   WBC 6.4  4.0 - 10.5 K/uL   RBC 4.34  3.87 - 5.11 MIL/uL   Hemoglobin 14.2  12.0 - 15.0 g/dL   HCT 39.8  36.0 - 46.0 %   MCV 91.7  78.0 - 100.0 fL   MCH 32.7  26.0 - 34.0 pg   MCHC 35.7  30.0 - 36.0 g/dL   RDW 14.2  11.5 - 15.5 %   Platelets 211  150 - 400 K/uL  COMPREHENSIVE METABOLIC PANEL     Status: Abnormal   Collection Time    05/13/13  5:15 PM      Result Value Ref Range   Sodium 140  137 - 147 mEq/L   Potassium 2.8 (*) 3.7 - 5.3 mEq/L   Comment: CRITICAL RESULT CALLED TO, READ BACK BY AND VERIFIED  WITH:     PEGRON,E RN 05/13/13 1840 WOOTEN,K   Chloride 92 (*) 96 - 112 mEq/L   CO2 31  19 - 32 mEq/L   Glucose, Bld 82  70 - 99 mg/dL   BUN 11  6 - 23 mg/dL   Creatinine, Ser 0.90  0.50 - 1.10 mg/dL   Calcium 9.2  8.4 - 10.5 mg/dL   Total Protein 7.6  6.0 - 8.3 g/dL   Albumin 4.0  3.5 - 5.2 g/dL   AST 88 (*) 0 - 37 U/L   ALT 44 (*) 0 - 35 U/L   Alkaline Phosphatase 86  39 - 117 U/L   Total Bilirubin 0.3  0.3 - 1.2 mg/dL   GFR calc non Af Amer 83 (*) >90 mL/min   GFR calc Af Amer >90  >90 mL/min   Comment: (NOTE)     The eGFR has been calculated using the CKD EPI equation.     This calculation has not been validated in all clinical situations.     eGFR's persistently <90 mL/min signify possible Chronic Kidney     Disease.  ETHANOL     Status: Abnormal   Collection Time    05/13/13  5:15 PM      Result Value Ref Range   Alcohol, Ethyl (B) 226 (*) 0 - 11 mg/dL   Comment:            LOWEST DETECTABLE LIMIT FOR     SERUM ALCOHOL IS 11 mg/dL     FOR MEDICAL PURPOSES ONLY  SALICYLATE LEVEL     Status:  Abnormal   Collection Time    05/13/13  5:15 PM      Result Value Ref Range   Salicylate Lvl <5.4 (*) 2.8 - 20.0 mg/dL  I-STAT TROPOININ, ED     Status: None   Collection Time    05/13/13  5:32 PM      Result Value Ref Range   Troponin i, poc 0.01  0.00 - 0.08 ng/mL   Comment 3            Comment: Due to the release kinetics of cTnI,     a negative result within the first hours     of the onset of symptoms does not rule out     myocardial infarction with certainty.     If myocardial infarction is still suspected,     repeat the test at appropriate intervals.  URINE RAPID DRUG SCREEN (HOSP PERFORMED)     Status: Abnormal   Collection Time    05/13/13  8:52 PM      Result Value Ref Range   Opiates NONE DETECTED  NONE DETECTED   Judy [name] POSITIVE (*) NONE DETECTED   Benzodiazepines NONE DETECTED  NONE DETECTED   Amphetamines NONE DETECTED  NONE DETECTED   Tetrahydrocannabinol NONE DETECTED  NONE DETECTED   Barbiturates NONE DETECTED  NONE DETECTED   Comment:            DRUG SCREEN FOR MEDICAL PURPOSES     ONLY.  IF CONFIRMATION IS NEEDED     FOR ANY PURPOSE, NOTIFY LAB     WITHIN 5 DAYS.                LOWEST DETECTABLE LIMITS     FOR URINE DRUG SCREEN     Drug Class       Cutoff (ng/mL)     Amphetamine      1000  Barbiturate      200     Benzodiazepine   517     Tricyclics       616     Opiates          300     Judy [name]          300     THC              50  URINALYSIS, ROUTINE W REFLEX MICROSCOPIC     Status: Abnormal   Collection Time    05/13/13  8:52 PM      Result Value Ref Range   Color, Urine AMBER (*) YELLOW   Comment: BIOCHEMICALS MAY BE AFFECTED BY COLOR   APPearance CLOUDY (*) CLEAR   Specific Gravity, Urine 1.029  1.005 - 1.030   pH 7.5  5.0 - 8.0   Glucose, UA NEGATIVE  NEGATIVE mg/dL   Hgb urine dipstick LARGE (*) NEGATIVE   Bilirubin Urine NEGATIVE  NEGATIVE   Ketones, ur 15 (*) NEGATIVE mg/dL   Protein, ur 30 (*) NEGATIVE mg/dL   Urobilinogen, UA  1.0  0.0 - 1.0 mg/dL   Nitrite NEGATIVE  NEGATIVE   Leukocytes, UA NEGATIVE  NEGATIVE  URINE MICROSCOPIC-ADD ON     Status: Abnormal   Collection Time    05/13/13  8:52 PM      Result Value Ref Range   Squamous Epithelial / LPF FEW (*) RARE   WBC, UA 0-2  <3 WBC/hpf   RBC / HPF TOO NUMEROUS TO COUNT  <3 RBC/hpf   Bacteria, UA FEW (*) RARE   Urine-Other MUCOUS PRESENT    POC URINE PREG, ED     Status: None   Collection Time    05/13/13  9:19 PM      Result Value Ref Range   Preg Test, Ur NEGATIVE  NEGATIVE   Comment:            THE SENSITIVITY OF THIS     METHODOLOGY IS >24 mIU/mL  POTASSIUM     Status: Abnormal   Collection Time    05/13/13  9:22 PM      Result Value Ref Range   Potassium 2.8 (*) 3.7 - 5.3 mEq/L   Comment: CRITICAL RESULT CALLED TO, READ BACK BY AND VERIFIED WITH:     J. Dalbert Batman RN [id number] GREEN R  POTASSIUM     Status: None   Collection Time    05/14/13  1:48 AM      Result Value Ref Range   Potassium 3.9  3.7 - 5.3 mEq/L   Comment: DELTA CHECK NOTED   Labs are reviewed and are pertinent for UDS is positive for Judy [name], alcohol level is 226, UA SHOWS POSSIBLE UTI, elevated LFT  Current Facility-Administered Medications  Medication Dose Route Frequency Provider Last Rate Last Dose  . chlordiazePOXIDE (LIBRIUM) capsule 25 mg  25 mg Oral Q6H PRN Babette Relic, MD      . chlordiazePOXIDE (LIBRIUM) capsule 25 mg  25 mg Oral QID Lurena Nida, NP   25 mg at 05/14/13 0932   Followed by  . [START ON 05/15/2013] chlordiazePOXIDE (LIBRIUM) capsule 25 mg  25 mg Oral TID Lurena Nida, NP       Followed by  . [START ON 05/16/2013] chlordiazePOXIDE (LIBRIUM) capsule 25 mg  25 mg Oral BH-qamhs Lurena Nida, NP       Followed by  . [START ON 05/17/2013] chlordiazePOXIDE (LIBRIUM)  capsule 25 mg  25 mg Oral Daily Lurena Nida, NP      . hydrOXYzine (ATARAX/VISTARIL) tablet 25 mg  25 mg Oral Q6H PRN Babette Relic, MD      . loperamide (IMODIUM) capsule 2-4 mg  2-4 mg  Oral PRN Babette Relic, MD      . multivitamin with minerals tablet 1 tablet  1 tablet Oral Daily Babette Relic, MD   1 tablet at 05/14/13 0932  . thiamine (VITAMIN B-1) tablet 100 mg  100 mg Oral Daily Babette Relic, MD   100 mg at 05/14/13 3716   Current Outpatient Prescriptions  Medication Sig Dispense Refill  . escitalopram (LEXAPRO) 20 MG tablet Take 20 mg by mouth daily.      . mirtazapine (REMERON) 30 MG tablet Take 30 mg by mouth at bedtime.      . pantoprazole (PROTONIX) 40 MG tablet Take 40 mg by mouth daily.      . potassium chloride (K-DUR) 10 MEQ tablet Take 10 mEq by mouth daily.      Review of Physical examination performed by EDP on 05/13/2013 is WNL  Psychiatric Specialty Exam:     Blood pressure 110/70, pulse 60, temperature 98 F (36.7 C), temperature source Oral, resp. rate 20, SpO2 95.00%.There is no weight on file to calculate BMI.  General Appearance: Casual and Disheveled  Eye Contact::  Good  Speech:  Clear and Coherent and Normal Rate  Volume:  Normal  Mood:  Depressed, Hopeless and Worthless  Affect:  Depressed and Flat  Thought Process:  Coherent and Goal Directed  Orientation:  Full (Time, Place, and Person)  Thought Content:  NA  Suicidal Thoughts:  No  Homicidal Thoughts:  No  Memory:  Immediate;   Good Recent;   Good Remote;   Good  Judgement:  Poor  Insight:  Lacking  Psychomotor Activity:  Tremor  Concentration:  Good  Recall:  NA  Fund of Knowledge:Good  Language: Good  Akathisia:  NA  Handed:  Right  AIMS (if indicated):     Assets:  Desire for Improvement  Sleep:      Musculoskeletal: Strength & Muscle Tone: within normal limits Gait & Station: normal Patient leans: N/A  Treatment Plan Summary:  Consult and face to face with Dr De Nurse Patient is accepted for admission for safety and stabilization.  We will admit here in our facility or look for bed at other facilities with available beds.   We have already initiated our Librium  protocol for her alcohol detox We will add Amantadine 100 mg po tid for Judy [name] craving We will repeat UA and treat for UTI if needed. Daily contact with patient to assess and evaluate symptoms and progress in treatment Medication management  Delfin Gant  PMHNP-BC 05/14/2013 11:09 AM I have personally seen the patient and agreed with the findings and involved in the treatment plan. Merian Capron, MD

## 2013-05-15 ENCOUNTER — Encounter (HOSPITAL_COMMUNITY): Payer: Self-pay | Admitting: Psychiatry

## 2013-05-15 DIAGNOSIS — F3289 Other specified depressive episodes: Secondary | ICD-10-CM

## 2013-05-15 DIAGNOSIS — F1020 Alcohol dependence, uncomplicated: Secondary | ICD-10-CM

## 2013-05-15 DIAGNOSIS — F1420 Cocaine dependence, uncomplicated: Secondary | ICD-10-CM

## 2013-05-15 DIAGNOSIS — F329 Major depressive disorder, single episode, unspecified: Secondary | ICD-10-CM

## 2013-05-15 MED ORDER — ESCITALOPRAM OXALATE 20 MG PO TABS
20.0000 mg | ORAL_TABLET | Freq: Every day | ORAL | Status: DC
  Administered 2013-05-15 – 2013-05-16 (×2): 20 mg via ORAL
  Filled 2013-05-15: qty 1
  Filled 2013-05-15: qty 2
  Filled 2013-05-15 (×4): qty 1

## 2013-05-15 MED ORDER — ENSURE COMPLETE PO LIQD
237.0000 mL | Freq: Two times a day (BID) | ORAL | Status: DC
Start: 2013-05-15 — End: 2013-05-17
  Administered 2013-05-15 – 2013-05-17 (×4): 237 mL via ORAL

## 2013-05-15 MED ORDER — PANTOPRAZOLE SODIUM 40 MG PO TBEC
40.0000 mg | DELAYED_RELEASE_TABLET | Freq: Every day | ORAL | Status: DC
  Administered 2013-05-15 – 2013-05-16 (×2): 40 mg via ORAL
  Filled 2013-05-15 (×7): qty 1

## 2013-05-15 MED ORDER — CIPROFLOXACIN HCL 500 MG PO TABS
500.0000 mg | ORAL_TABLET | Freq: Two times a day (BID) | ORAL | Status: DC
  Filled 2013-05-15: qty 1

## 2013-05-15 MED ORDER — MIRTAZAPINE 30 MG PO TABS
30.0000 mg | ORAL_TABLET | Freq: Every day | ORAL | Status: DC
  Administered 2013-05-15 – 2013-05-16 (×2): 30 mg via ORAL
  Filled 2013-05-15 (×5): qty 1

## 2013-05-15 MED ORDER — CIPROFLOXACIN HCL 500 MG PO TABS
500.0000 mg | ORAL_TABLET | Freq: Two times a day (BID) | ORAL | Status: DC
  Administered 2013-05-15 – 2013-05-16 (×3): 500 mg via ORAL
  Filled 2013-05-15 (×6): qty 1

## 2013-05-15 NOTE — Clinical Social Work Note (Signed)
Per pt request, CSW left voicemail for her public defender, Colvin CaroliDouglas Wink. Letter also faxed to Colvin Caroliouglas Wink stating [facility] and will not be able to make her 2/25 court date. Pt [facility]. Pt provided with fax and confirmation for her records. Douglas wink contact: [phone]/fax: [phone].  [employer] Sherwin-WilliamsHeather Smart, LCSWA 05/15/2013 11:55 AM

## 2013-05-15 NOTE — [facility] Group Notes (Signed)
[facility] Dis[facility] Note   05/15/2013 9:25 AM  Participation Quality:  DID NOT ATTEND-"I feel sick"   Smart, Arnette Driggs LCSWA

## 2013-05-15 NOTE — Progress Notes (Signed)
NUTRITION ASSESSMENT  Pt identified as at risk on the Malnutrition Screen Tool  INTERVENTION: 1. Educated patient on the importance of nutrition and encouraged intake of food and beverages. 2. Discussed weight goals. 3. Supplements: MVI and thiamine daily and Ensure Complete po TID, each supplement provides 350 kcal and 13 grams of protein  4.  HS snack   NUTRITION DIAGNOSIS: Unintentional weight loss related to sub-optimal intake as evidenced by pt report.   Goal: Pt to meet >/= 90% of their estimated nutrition needs.  Monitor:  PO intake  Assessment:  Patient admitted with alcohol dependence, cocaine dependence and depressive disorder.  She has had recent weight loss of 19 lbs in the past 3 months and now meets criteria for underweight.  Patient reports UBW of 170 lbs 2-3 months ago.  This is not confirmed by chart.    Patient reports poor intake secondary to detox.  Poor intake prior to admit and weight loss secondary to drug use.  Patient meets criteria for severe malnutrition related to social and environmental causes AEB 15% weight loss in the past 3 months and intake <50% for more than 1 month.  33 y.o. female  Height: Ht Readings from Last 1 Encounters:  05/14/13 5\' 3"  (1.6 m)    Weight: Wt Readings from Last 1 Encounters:  05/14/13 103 lb (46.72 kg)    Weight Hx: Wt Readings from Last 10 Encounters:  05/14/13 103 lb (46.72 kg)  03/18/13 108 lb (48.988 kg)  03/10/13 111 lb (50.349 kg)  03/02/13 111 lb 8 oz (50.576 kg)  02/09/13 121 lb (54.885 kg)    BMI:  Body mass index is 18.25 kg/(m^2). Pt meets criteria for underweight based on current BMI.  Estimated Nutritional Needs: Kcal: 25-30 kcal/kg Protein: > 1 gram protein/kg Fluid: 1 ml/kcal  Diet Order: General Pt is also offered choice of unit snacks mid-morning and mid-afternoon.  Pt is eating as desired.   Lab results and medications reviewed.   Oran ReinLaura Zineb Glade, RD, LDN Clinical Inpatient  Dietitian Pager:  [phone] Weekend and after hours pager:  [phone]

## 2013-05-15 NOTE — Progress Notes (Signed)
Adult Psychoeducational Group Note  Date:  05/15/2013 Time:  10:00 am  Group Topic/Focus:  Wellness Toolbox:   The focus of this group is to discuss various aspects of wellness, balancing those aspects and exploring ways to increase the ability to experience wellness.  Patients will create a wellness toolbox for use upon discharge.  Participation Level:  Did Not Attend   Modena NunneryJohnson, Noam Karaffa 05/15/2013, 3:40 PM

## 2013-05-15 NOTE — Tx Team (Signed)
Interdisciplinary Treatment Plan Update (Adult)  Date: 05/15/2013   Time Reviewed: 11:01 AM  Progress in Treatment:  Attending groups: No.  Participating in groups:  No.  Taking medication as prescribed: Yes  Tolerating medication: Yes  Family/Significant othe contact made: No. SPE not required for this pt.   Patient understands diagnosis: Yes, AEB seeking treatment for ETOH detox, cocaine abuse, and mood stabilization.  Discussing patient identified problems/goals with staff: Yes  Medical problems stabilized or resolved: Yes  Denies suicidal/homicidal ideation: Yes during admission/self report.  Patient has not harmed self or Others: Yes  New problem(s) identified:  Discharge Plan or Barriers: Pt not attending d/c planning at this time. CSW assessing for appropriate referrals.  Additional comments:  Voluntary admission for a 33 year old female with flat affect, depressed mood who reports "I am tired of being sick and tired" requesting alcohol detox. Reports drinking 1 fifth of liquor and 3-24 oz cans of margaritas daily along with use of $400.00 worth of cocaine. Last use 05/13/13. Pt. Denies SI/HI and denies A/V hallucinations. Reports legal issues with a court date 05/17/13. Pt. Given and consumed food and fluids. Oriented to unit. Pt. Remains safe.    Reason for Continuation of Hospitalization: Librium taper-withdrawals  Mood stabilization Estimated length of stay: 3-5 days  For review of initial/current patient goals, please see plan of care.  Attendees:  Patient:    Family:    Physician: Geoffery LyonsIrving Lugo MD 05/15/2013 11:01 AM   Nursing:    Clinical Social Worker [employer] Sher[employer]-WilliamsHeather Smart, LCSWA  05/15/2013 11:01 AM   Other:   Other:    Other:     Other:    Scribe for Treatment Team:  Trula SladeHeather Smart LCSWA 05/15/2013 11:01 AM

## 2013-05-15 NOTE — Progress Notes (Signed)
05-15-13 NSG NOTE  7a-3p  D: Affect is anxious and depressed.  Mood is depressed.  Behavior is anxious but cooperative with encouragement, direction and support.  Interacts appropriately with peers and staff.  Participated in counselor lead group.   Reports poor sleep and improving appetite, with low energy levels and poor ability to pay attention.  Identified changes she would have to make post discharge.  Continues to complain of withdrawal symptoms  A:  Medications per MD order.  Support given throughout day.  1:1 time spent with pt.  R:  Following treatment plan.  Denies HI/SI, auditory or visual hallucinations.  Contracts for safety.

## 2013-05-15 NOTE — [facility] Group Notes (Signed)
[facility] LCSW Group Therapy  05/15/2013 2:59 PM  Type of Therapy:  Group Therapy  Participation Level:  Active  Participation Quality:  Attentive  Affect:  Depressed and Flat  Cognitive:  Alert  Insight:  Lacking  Engagement in Therapy:  Limited  Modes of Intervention:  Confrontation, Discussion, Education, Exploration, Problem-solving, Rapport Building, Socialization and Support  Summary of Progress/Problems: Today's Topic: Overcoming Obstacles. Pt identified obstacles faced currently and processed barriers involved in overcoming these obstacles. Pt identified steps necessary for overcoming these obstacles and explored motivation (internal and external) for facing these difficulties head on. Pt further identified one area of concern in their lives and chose a skill of focus pulled from their "toolbox." Judy [name] was attentive throughout today's therapy group. She shared that her biggest obstacle is "staying clean and living a normal life." Judy [name] identified money as her primary trigger and requested a 2 year treatment program. Judy [name] was discussed with pt. She prefers "somewhere where i am locked up for two years." CSW informed pt that there is not a 2 year program where she would be locked in. Pt shows progress in the group setting and improving insight AEB her ability to process how asking her defense attorney to request court ordered treatment can help her get into treatment program that can help her stay clean. Pt given her defense attorney's contact info by CSW.    Smart, Barbara Ahart LCSWA  05/15/2013, 2:59 PM
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
## 2013-05-15 NOTE — [facility] Suicide Risk Assessment (Signed)
Suicide Risk Assessment  Admission Assessment     Nursing information obtained from:  Patient Demographic factors:  Unemployed Current Mental Status:  NA Loss Factors:  Legal issues;Financial problems / change in socioeconomic status Historical Factors:  Family history of mental illness or substance abuse Risk Reduction Factors:  Living with another person, especially a relative Total Time spent with patient: 45 minutes  CLINICAL FACTORS:   Depression:   Anhedonia Comorbid alcohol abuse/dependence Impulsivity Insomnia Alcohol/Substance Abuse/Dependencies  COGNITIVE FEATURES THAT CONTRIBUTE TO RISK:  Closed-mindedness Polarized thinking Thought constriction (tunnel vision)    SUICIDE RISK:   Moderate:  Frequent suicidal ideation with limited intensity, and duration, some specificity in terms of plans, no associated intent, good self-control, limited dysphoria/symptomatology, some risk factors present, and identifiable protective factors, including available and accessible social support.  PLAN OF CARE: Supportive approach/coping skills/relapse prevention                               Detox as needed                               Reassess and address the co morbidities  I certify that inpatient services furnished can reasonably be expected to improve the patient's condition.  Laveyah Oriol A 05/15/2013, 4:13 PM

## 2013-05-15 NOTE — [facility] Suicide Risk Assessment (Signed)
[facility] INPATIENT: Family/Significant Other Suicide Prevention Education  Suicide Prevention Education:  Education Completed; No one has been identified by the patient as the family member/significant other with whom the patient will be residing, and identified as the person(s) who will aid the patient in the event of a mental health crisis (suicidal ideations/suicide attempt).   Pt did not c/o SI at admission, nor have they endorsed SI during their stay here. SPE not required. SPI pamphlet provided to pt and she was encouraged to share information with support network, ask questions, and talk about any concerns relating to SPE.  The Sherwin-WilliamsHeather Smart, LCSWA 05/15/2013 11:34 AM

## 2013-05-15 NOTE — [facility] Counselor (Signed)
Adult Psychosocial Assessment Update Interdisciplinary Team  [facility]  Admissions Discharges  Date: 05/14/13 Date: unknown at this time.   Date: 02/09/13 Date: 02/13/13  Date: Date:  Date: Date:  Date: Date:   Changes since the last Psychosocial Assessment (including adherence to outpatient mental health and/or substance abuse treatment, situational issues contributing to decompensation and/or relapse). Judy [name] is a 33 y.o. female who [facility] alcohol/opiate/cocaine detox. Pt denies SI/HI/AVH. Pt is visibly intoxicated but she is able to provide a reasonable hx. Pt reports the following: she drinks 1/5 of liquor, 3-24oz cans of margaritas, 1/2 pint of liquor and 2 "airplane bottles" of gin and 3 screwdrivers, consuming all beverages daily. Pt states last use was 05/13/13. Pt uses $400 worth of cocaine, daily, last use was 05/13/13. Pt also ingests 2 hydrocodone pills, daily, last use was 2 days ago. Pt is crying during interview, stating--"I can't stop using" and she reports increasing depressive sxs due to SA and other stressor: daughter was adopted at age 583 because of pt drug use. Pt admits she funds her drug use from a disability check(she has a payee), deceased father's insurance policy(she has a payee) and her boyfriend provides money to her when she requests it. Pt.'s current w/d sxs are nausea, she denies any issues with seizures or blackouts. Pt has an upcoming court date on 05/17/13 for an assault charge.               Discharge Plan 1. Will you be returning to the same living situation after discharge?   Yes: unknown  No:      If no, what is your plan?    Pt not attending d/c planning group and is unsure if she would like referral for inpatient treatment or will return home and follow up o/p.        2. Would you like a referral for services when you are discharged? Yes:     If yes, for what services?  No:        Pt deciding if she would like referral for inpatient vs outpatient treatment. CSW assessing for appropriate referrals at this time.        Summary and Recommendations (to be completed by the evaluator) Pt is 33 year old [place]. [facility], cocaine abuse, ETOH detox, and mood stabilization. Pt denies SI/HI/AVH upon admission. Pt has pending court date on 2/25. Recommendations for pt include: crisis stabilization, therapeutic milieu, encourage group attendance and participation, librium taper for withdrawals, medication management for mood stabilization, and development of comprehensive mental wellness/sobriety plan.                        Signature:  Micah NoelSmart, Judy [name], LCSWA  05/15/2013 11:30 AM

## 2013-05-16 MED ORDER — ONDANSETRON HCL 4 MG PO TABS: 8.0000 mg | ORAL_TABLET | Freq: Three times a day (TID) | ORAL | Status: DC | PRN

## 2013-05-16 MED ORDER — ZOLPIDEM TARTRATE 10 MG PO TABS
10.0000 mg | ORAL_TABLET | Freq: Every evening | ORAL | Status: DC | PRN
  Administered 2013-05-16: 10 mg via ORAL
  Filled 2013-05-16: qty 1

## 2013-05-16 NOTE — Progress Notes (Signed)
Pt awake at nursing station requesting snacks and drinks (both given). AOx3, No c/o voiced. No distress noted. Will continue to monitor closely.

## 2013-05-16 NOTE — Clinical Social Work Note (Signed)
CSW met with pt individually to discuss aftercare plan. Pt hoping for long term placement. She has been to [facility] and is not wanting to return. 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 Pt given application to [facility] to complete today. CSW contacted [facility]-message left for Bary Richard to schedule phone interview. CSW also provided pt with comprehensive list of [place] [facility] and other resources.  [place], LCSWA 05/16/2013 10:53 AM

## 2013-05-16 NOTE — Progress Notes (Signed)
[facility]  05/16/2013 12:50 PM [name] Lottie RaterShonte [name]  MRN:  [id number] Subjective:  Judy ArchRenishia states she is having a hard time. She continues to experience nausea, has not been able to sleep what states she sleeps during the day. She wants to be able to participate but states that she still feels she is detoxing. She is also taking the antibiotics what she says make her nauseated Diagnosis:   DSM5: Schizophrenia Disorders:  none Obsessive-Compulsive Disorders:  none Trauma-Stressor Disorders:  none Substance/Addictive Disorders:  Alcohol Related Disorder - Severe (303.90), Cocaine related disorder Depressive Disorders:  Major Depressive Disorder - Severe (296.23) Total Time spent with patient: 30 minutes  Axis I: Anxiety Disorder NOS and Substance Induced Mood Disorder  ADL's:  Intact  Sleep: Poor  Appetite:  Poor  Suicidal Ideation:  Plan:  denies Intent:  denies Means:  denies Homicidal Ideation:  Plan:  denies Intent:  denies Means:  denies AEB (as evidenced by):  Psychiatric Specialty Exam: Physical Exam  Review of Systems  Constitutional: Positive for malaise/fatigue.  HENT: Negative.   Eyes: Negative.   Respiratory: Negative.   Cardiovascular: Negative.   Gastrointestinal: Positive for nausea.  Genitourinary: Negative.   Musculoskeletal: Positive for back pain, joint pain and myalgias.  Skin: Negative.   Neurological: Positive for weakness.  Endo/Heme/Allergies: Negative.   Psychiatric/Behavioral: Positive for depression and substance abuse. The patient is nervous/anxious and has insomnia.     Blood pressure 105/75, pulse 109, temperature 97.9 F (36.6 C), temperature source Oral, resp. rate 16, height 5\' 3"  (1.6 m), weight 46.72 kg (103 lb), last menstrual period 05/14/2013.Body mass index is 18.25 kg/(m^2).  General Appearance: Disheveled  Eye SolicitorContact::  Fair  Speech:  Clear and Coherent  Volume:  Decreased  Mood:  Anxious and sad, worried   Affect:  anxious, worried  Thought Process:  Coherent and Goal Directed  Orientation:  Full (Time, Place, and Person)  Thought Content:  symptonms worries, concerns  Suicidal Thoughts:  No  Homicidal Thoughts:  No  Memory:  Immediate;   Fair Recent;   Fair Remote;   Fair  Judgement:  Fair  Insight:  Present and Shallow  Psychomotor Activity:  Restlessness  Concentration:  Fair  Recall:  FiservFair  Fund of Knowledge:N/A  Language: Fair  Akathisia:  No  Handed:    AIMS (if indicated):     Assets:  Financial Resources/Insurance  Sleep:  Number of Hours: 5.25   Musculoskeletal: Strength & Muscle Tone: within normal limits Gait & Station: normal Patient leans: N/A  Current Medications: Current Facility-Administered Medications  Medication Dose Route Frequency Provider Last Rate Last Dose  . acetaminophen (TYLENOL) tablet 650 mg  650 mg Oral Q6H PRN Earney NavyJosephine C Onuoha, NP   650 mg at 05/16/13 0646  . alum & mag hydroxide-simeth (MAALOX/MYLANTA) 200-200-20 MG/5ML suspension 30 mL  30 mL Oral Q4H PRN Earney NavyJosephine C Onuoha, NP      . amantadine (SYMMETREL) capsule 100 mg  100 mg Oral BID Earney NavyJosephine C Onuoha, NP   100 mg at 05/16/13 0747  . chlordiazePOXIDE (LIBRIUM) capsule 25 mg  25 mg Oral Q6H PRN Earney NavyJosephine C Onuoha, NP   25 mg at 05/14/13 2136  . chlordiazePOXIDE (LIBRIUM) capsule 25 mg  25 mg Oral BH-qamhs Earney NavyJosephine C Onuoha, NP       Followed by  . [START ON 05/18/2013] chlordiazePOXIDE (LIBRIUM) capsule 25 mg  25 mg Oral Daily Earney NavyJosephine C Onuoha, NP      . ciprofloxacin (  CIPRO) tablet 500 mg  500 mg Oral BID Kristeen Mans, NP   500 mg at 05/15/13 2146  . escitalopram (LEXAPRO) tablet 20 mg  20 mg Oral Daily Rachael Fee, MD   20 mg at 05/16/13 0747  . feeding supplement (ENSURE COMPLETE) (ENSURE COMPLETE) liquid 237 mL  237 mL Oral BID BM Jeoffrey Massed, RD   237 mL at 05/16/13 0747  . hydrOXYzine (ATARAX/VISTARIL) tablet 25 mg  25 mg Oral Q6H PRN Earney Navy, NP   25 mg at  05/14/13 2135  . loperamide (IMODIUM) capsule 2-4 mg  2-4 mg Oral PRN Earney Navy, NP      . magnesium hydroxide (MILK OF MAGNESIA) suspension 30 mL  30 mL Oral Daily PRN Earney Navy, NP      . mirtazapine (REMERON) tablet 30 mg  30 mg Oral QHS Rachael Fee, MD   30 mg at 05/15/13 2146  . multivitamin with minerals tablet 1 tablet  1 tablet Oral Daily Earney Navy, NP      . ondansetron (ZOFRAN) tablet 8 mg  8 mg Oral Q8H PRN Rachael Fee, MD      . pantoprazole (PROTONIX) EC tablet 40 mg  40 mg Oral Daily Rachael Fee, MD   40 mg at 05/16/13 0747  . thiamine (VITAMIN B-1) tablet 100 mg  100 mg Oral Daily Earney Navy, NP   100 mg at 05/15/13 0846  . zolpidem (AMBIEN) tablet 10 mg  10 mg Oral QHS PRN Rachael Fee, MD        Lab Results:  Results for orders placed during the hospital encounter of 05/14/13 (from the past 48 hour(s))  URINALYSIS, ROUTINE W REFLEX MICROSCOPIC     Status: Abnormal   Collection Time    05/14/13  6:52 PM      Result Value Ref Range   Color, Urine YELLOW  YELLOW   APPearance TURBID (*) CLEAR   Specific Gravity, Urine 1.021  1.005 - 1.030   pH 8.5 (*) 5.0 - 8.0   Glucose, UA NEGATIVE  NEGATIVE mg/dL   Hgb urine dipstick LARGE (*) NEGATIVE   Bilirubin Urine NEGATIVE  NEGATIVE   Ketones, ur NEGATIVE  NEGATIVE mg/dL   Protein, ur 30 (*) NEGATIVE mg/dL   Urobilinogen, UA 1.0  0.0 - 1.0 mg/dL   Nitrite NEGATIVE  NEGATIVE   Leukocytes, UA MODERATE (*) NEGATIVE   Comment: Performed at [facility]  URINE MICROSCOPIC-ADD ON     Status: None   Collection Time    05/14/13  6:52 PM      Result Value Ref Range   Squamous Epithelial / LPF RARE  RARE   WBC, UA 3-6  <3 WBC/hpf   RBC / HPF 21-50  <3 RBC/hpf   Bacteria, UA RARE  RARE   Urine-Other AMORPHOUS URATES/PHOSPHATES     Comment: Performed at [facility]    Physical Findings: AIMS:  , ,  ,  ,    CIWA:  CIWA-Ar Total: 2 COWS:     Treatment  Plan Summary: Daily contact with patient to assess and evaluate symptoms and progress in treatment Medication management  Plan:  Supportive approach/coping skills/relapse prevention            Continue detox            Reassess and address the co morbidities  Has done well on Ambien in the past, given that she needs to sleep will go ahead and               try the Ambien.             Facilitate applying to a long term program  Medical Decision Making Problem Points:  Established problem, worsening (2) and Review of psycho-social stressors (1) Data Points:  Review of medication regiment & side effects (2) Review of new medications or change in dosage (2)  I certify that inpatient services furnished can reasonably be expected to improve the patient's condition.   Kimyatta Lecy A 05/16/2013, 12:50 PM

## 2013-05-16 NOTE — [facility] Group Notes (Signed)
[facility] Group Notes:  (Nursing/MHT/Case Management/Adjunct)  Date:  05/16/2013  Time0900am Type of Therapy:  Psychoeducational Skills  Participation Level:  Did Not Attend   Judy [name], Judy [name] 05/16/2013, 3:04 PM

## 2013-05-16 NOTE — Progress Notes (Signed)
Patient ID: Judy SimonsRenisha Shonte [name], female   DOB: 10-Jun-1980, 33 y.o.   MRN: [id number] She was up for AM meal and medications. Was in a verbal altercation with her roommate over the temperature of their room. She requested and received ginger ale for c/o nausea and has been asleep sience then.

## 2013-05-16 NOTE — [facility] Group Notes (Signed)
[facility] LCSW Group Therapy  05/16/2013 1:55 PM  Type of Therapy:  Group Therapy  Participation Level:  Did Not Attend-pt in bed resting/refused to attend afternoon group.  Smart, Jawanna Dykman LCSWA 05/16/2013, 1:55 PM

## 2013-05-16 NOTE — Progress Notes (Signed)
Recreation Therapy Notes  Date: 02.24.2015 Time: 2:45pm Location: [address] Dayroom   Group Topic: Wellness  Goal Area(s) Addresses:  Patient will define components of whole wellness. Patient will verbalize benefit of whole wellness.  Behavioral Response: Did not attend.   Raianna Slight L Yechiel Erny, LRT/CTRS  Gwynevere Lizana L 05/16/2013 9:20 AM 

## 2013-05-17 MED ORDER — MIRTAZAPINE 30 MG PO TABS: 30.0000 mg | ORAL_TABLET | Freq: Every day | ORAL | Status: DC

## 2013-05-17 MED ORDER — PANTOPRAZOLE SODIUM 40 MG PO TBEC: 40.0000 mg | DELAYED_RELEASE_TABLET | Freq: Every day | ORAL | Status: DC

## 2013-05-17 MED ORDER — CIPROFLOXACIN HCL 500 MG PO TABS: 500.0000 mg | ORAL_TABLET | Freq: Two times a day (BID) | ORAL | Status: DC

## 2013-05-17 MED ORDER — ESCITALOPRAM OXALATE 20 MG PO TABS: 20.0000 mg | ORAL_TABLET | Freq: Every day | ORAL | Status: DC

## 2013-05-17 MED ORDER — ZOLPIDEM TARTRATE 10 MG PO TABS
10.0000 mg | ORAL_TABLET | Freq: Every evening | ORAL | Status: DC | PRN
Start: 2013-05-17 — End: 2013-11-16

## 2013-05-17 NOTE — [facility] Group Notes (Signed)
[facility]   05/17/2013 9:05 AM  Participation Quality:  DID NOT ATTEND-pt in bed/refused to attend morning group.   Smart, American FinancialHeather LCSWA

## 2013-05-17 NOTE — Progress Notes (Signed)
Adult Psychoeducational Group Note  Date:  05/16/2013 Time: 8:00 PM  Group Topic/Focus:  AA  Participation Level:  None  Participation Quality:  Inattentive  Affect:  Labile  Cognitive:  Lacking  Insight: None  Engagement in Group:  None  Modes of Intervention:  Limit-setting  Additional Comments:  At the beginning of group, the patient had to be told to stop talking while the facilitators were speaking. Pt left group, then returned, angry over the fact that environmental checks were being done. Pt spoke out once again while the facilitators were speaking, was redirected by this Clinical research associatewriter. Pt then left group and never returned.   Humberto SealsWhitaker, Carron Jaggi Monique 05/17/2013, 2:55 AM

## 2013-05-17 NOTE — [facility] Group Notes (Signed)
[facility] LCSW Group Therapy  05/17/2013 2:24 PM  Type of Therapy:  Group Therapy  Participation Level:  Did Not Attend-pt preparing for d/c. She came into group/interupted a pt to demand she get discharged. CSW told pt not to interrupt group and please leave the room.   Smart, Shaman Muscarella LCSWA  05/17/2013, 2:24 PM

## 2013-05-17 NOTE — Progress Notes (Addendum)
Recreation Therapy Notes  Date: 02.24.2015 Time: 2:45pm Location: [address] Dayroom   Group Topic: Communication, Team Building, Problem Solving  Goal Area(s) Addresses:  Patient will effectively work with peer towards shared goal.  Patient will identify skill used to make activity successful.  Patient will identify how skills used during activity can be used to reach post d/c goals.   Behavioral Response: Intermittent attendance, Disrespectful during attendance.   Intervention: Problem Solving Activitiy  Activity: Life Boat. Patients were given a scenario about being on a sinking yacht. Patients were informed the yacht included 15 guest, 8 of which could be placed on the life boat, along with all group members. Individuals on guest list were of varying socioeconomic classes such as a Education officer, museumriest, Materials engineerresident Obama, MidwifeBus Driver, Tree surgeonTeacher and Chef.   Education: Pharmacist, communityocial Skills, Discharge Planning   Education Outcome: Acknowledges understanding  Clinical Observations/Feedback: Patient was present in group session for opening statements and explanation of group activity. Patient then exited group session without explanation. Patient returned to group session during group processing, patient stated "I get a check on the 1st & 15th can I get on that boat." LRT redirected patient, stating the activity portion of group session was over and processing had begun. Patient maintained a side conversation with peer during processing, despite LRT attempts to redirect patient.   Marykay Lexenise L Danilyn Cocke, LRT/CTRS  Jearl KlinefelterBlanchfield, Sheana Bir L 05/17/2013 12:58 PM

## 2013-05-17 NOTE — Progress Notes (Signed)
Patient ID: Judy SimonsRenisha Shonte Bezanson, female   DOB: Mar 17, 1981, 33 y.o.   MRN: [id number]  D: Pt was demanding and loud at times. Continued to ask for ginger ale through the first part of the shift. Telling the writer that "the Dr told her she could have as many sodas as she wants". Stated, "I don't like water". Writer informed pt that she would not be giving sodas all shift and it was reported that she'd gotten several thru the early part of the day.  Continued to encourage water for hydration.  A:  Support and encouragement was offered. 15 min checks continued for safety.  R: Pt remains safe.

## 2013-05-17 NOTE — Progress Notes (Signed)
[facility] :  Will you be returning to the same living situation after discharge: Yes,  home At discharge, do you have transportation home?:Yes,  boyfriend coming at 2pm Do you have the ability to pay for your medications:Yes,  [facility] medicaid  Release of information consent forms completed and submitted to medical records by CSW.  Patient to Follow up at: Follow-up Information   [facility] [place] On 05/22/2013. [facility](Hospital followup appt/medication management-walk in between 8am-10am. Referral number: [id number])    Contact information:   [address] [place], [place] [address] Phone: [phone] Fax: [phone]      [facility] (If interested in admission into this facility, please call [phone] for phone interview. )    Contact information:   [address] [place], [place] [address] Phone: [phone] Fax: [phone]      Patient denies SI/HI:   Yes,  during group/self report.    Safety Planning and Suicide Prevention discussed:  Yes,  SPE not required for pt as she did not endorse SI [facility] admission [facility]. SPI pamphlet provided to pt and she was encouraged to share information with support network, ask questions, and talk about any concerns relating to SPE.  Smart, Sherlock Nancarrow LCSWA  05/17/2013, 1:19 PM

## 2013-05-17 NOTE — [facility] Suicide Risk Assessment (Signed)
Suicide Risk Assessment  Discharge Assessment     Demographic Factors:  NA  Total Time spent with patient: 45 minutes  Psychiatric Specialty Exam:     Blood pressure 113/75, pulse 96, temperature 97.5 F (36.4 C), temperature source Oral, resp. rate 16, height 5\' 3"  (1.6 m), weight 46.72 kg (103 lb), last menstrual period 05/14/2013.Body mass index is 18.25 kg/(m^2).  General Appearance: Fairly Groomed  Patent attorneyye Contact::  Fair  Speech:  Clear and Coherent  Volume:  Normal  Mood:  Anxious  Affect:  anxious, worried  Thought Process:  Coherent and Goal Directed  Orientation:  Full (Time, Place, and Person)  Thought Content:  wants to be D/C as states her  mother is sick, and her relapse prevention plan  Suicidal Thoughts:  No  Homicidal Thoughts:  No  Memory:  Immediate;   Fair Recent;   Fair Remote;   Fair  Judgement:  Fair  Insight:  Shallow  Psychomotor Activity:  Restlessness  Concentration:  Fair  Recall:  FiservFair  Fund of Knowledge:Fair  Language: Fair  Akathisia:  No  Handed:    AIMS (if indicated):     Assets:  Desire for Improvement  Sleep:  Number of Hours: 4.75    Musculoskeletal: Strength & Muscle Tone: within normal limits Gait & Station: normal Patient leans: N/A   Mental Status Per Nursing Assessment::   On Admission:  NA  Current Mental Status by Physician: In full contact with reality. There are no suicidal ideas plans or intent, There are no active S/S of withdrawal. She states that she is wanting to go to a long term program she states she has to go as his mother is sick   Loss Factors: Legal issues  Historical Factors: NA  Risk Reduction Factors:   Sense of responsibility to family  Continued Clinical Symptoms:  Depression:   Comorbid alcohol abuse/dependence Alcohol/Substance Abuse/Dependencies  Cognitive Features That Contribute To Risk:  Closed-mindedness Polarized thinking Thought constriction (tunnel vision)    Suicide Risk:   Minimal: No identifiable suicidal ideation.  Patients presenting with no risk factors but with morbid ruminations; may be classified as minimal risk based on the severity of the depressive symptoms  Discharge Diagnoses:   AXIS I:  Alcohol cocaine dependence Depressive disorder nos AXIS II:  Deferred AXIS III:   Past Medical History  Diagnosis Date  . Depression   . Cocaine abuse   . Polysubstance abuse   . Anxiety   . Depression   . Alcohol abuse    AXIS IV:  other psychosocial or environmental problems and problems related to legal system/crime AXIS V:  61-70 mild symptoms  Plan Of Care/Follow-up recommendations:  Activity:  as tolerated   Diet:  regular Will follow up [facility] Is patient on multiple antipsychotic therapies at discharge:  No   Has Patient had three or more failed trials of antipsychotic monotherapy by history:  No  Recommended Plan for Multiple Antipsychotic Therapies: NA    Taimur Fier A 05/17/2013, 1:43 PM

## 2013-05-17 NOTE — Discharge Summary (Signed)
Physician Discharge Summary Note  Patient:  Judy [name] is an 33 y.o., female MRN:  [id number] DOB:  [birth year] Patient phone:  [phone] (home)  Patient address:   [address] [place] [address],  Total Time spent with patient: Greater than 30 minutes  Date of Admission:  05/14/2013 Date of Discharge: 05/17/13  Reason for Admission: Alcohol detox  Discharge Diagnoses: Active Problems:   Alcohol dependence   Cocaine dependence   Depressive disorder   Psychiatric Specialty Exam: Physical Exam  Constitutional: She is oriented to person, place, and time. She appears well-developed.  HENT:  Head: Normocephalic.  Eyes: Pupils are equal, round, and reactive to light.  Neck: Normal range of motion.  Cardiovascular: Normal rate.   Respiratory: Effort normal.  GI: Soft.  Genitourinary:  Denies any issues in this area  Musculoskeletal: Normal range of motion.  Neurological: She is alert and oriented to person, place, and time.  Skin: Skin is warm and dry.  Psychiatric: Her speech is normal and behavior is normal. Judgment and thought content normal. Her mood appears not anxious. Her affect is not angry, not blunt, not labile and not inappropriate. Cognition and memory are normal. She does not exhibit a depressed mood.    Review of Systems  Constitutional: Negative.   HENT: Negative.   Eyes: Negative.   Respiratory: Negative.   Cardiovascular: Negative.   Gastrointestinal: Negative.   Genitourinary: Negative.   Musculoskeletal: Negative.   Skin: Negative.   Neurological: Negative.   Endo/Heme/Allergies: Negative.   Psychiatric/Behavioral: Positive for depression (Stabilized with medication prior to discharge) and substance abuse (Alcohol dependence, Cocaine dependence,). Negative for suicidal ideas, hallucinations and memory loss. The patient has insomnia (Stabilized with medication prior to discharge). The patient is not nervous/anxious.      Blood pressure 113/75, pulse 96, temperature 97.5 F (36.4 C), temperature source Oral, resp. rate 16, height 5\' 3"  (1.6 m), weight 46.72 kg (103 lb), last menstrual period 05/14/2013.Body mass index is 18.25 kg/(m^2).  General Appearance: Casual and Fairly Groomed  Patent attorney::  Good  Speech:  Clear and Coherent  Volume:  Normal  Mood:  Stable  Affect:  Appropriate and Congruent  Thought Process:  Coherent and Intact  Orientation:  Full (Time, Place, and Person)  Thought Content:  Denies any psychotic symptoms  Suicidal Thoughts:  No  Homicidal Thoughts:  No  Memory:  Immediate;   Good Recent;   Good Remote;   Good  Judgement:  Good  Insight:  Present  Psychomotor Activity:  Normal  Concentration:  Good  Recall:  Good  Fund of Knowledge:Good  Language: Good  Akathisia:  No  Handed:  Right  AIMS (if indicated):     Assets:  Desire for Improvement  Sleep:  Number of Hours: 4.75    Past Psychiatric History: Diagnosis: Alcohol Related Disorder - Severe (303.90), Cocaine dependence, Major depressive disorder  Hospitalizations: [facility] adult unit  Outpatient Care: [facility] clinic in [place], [place]  Substance Abuse Care: [facility]  Self-Mutilation: Denies  Suicidal Attempts: Denies attempts and or thoughts  Violent Behaviors: Denies   Musculoskeletal: Strength & Muscle Tone: within normal limits Gait & Station: normal Patient leans: N/A  DSM5: Schizophrenia Disorders:  NA Obsessive-Compulsive Disorders:  NA Trauma-Stressor Disorders:  NA Substance/Addictive Disorders:  Alcohol Related Disorder - Severe (303.90), Cocaine dependence Depressive Disorders:  Major Depressive Disorder - Moderate (296.22)  Axis Diagnosis:   AXIS I:  Alcohol Related Disorder - Severe (  303.90), Cocaine dependence, major depressive disorder, moderate AXIS II:  Deferred AXIS III:   Past Medical History  Diagnosis Date  . Depression   . Cocaine abuse   . Polysubstance abuse   .  Anxiety   . Depression   . Alcohol abuse    AXIS IV:  Polysubstance dependence AXIS V:  63  Level of Care:  Has been referred to the [facility]  Hospital Course: 33 Y/O female who states she relapsed on cocaine, alcohol. Has been spending 1800 per month on both alcohol and cocaine ( two 12 pack, fith of vodka) drinks and uses every day. She was last here in November and [facility]. She did not stay past one day. She claims that the addiction has a hold over her. She states she lost her daughter to adoption due to her addictions and that this keeps coming back. She has a strong family history of addiction.   Judy [name] received Librium detoxification treatment protocols. She was admitted with blood alcohol level of 226. She was also intoxicated on admission. Judy [name] also received medication management for Major depressive disorder and associated sleeping problems. She was enrolled in the group counseling sessions as well AA/NA meetings being held on this unit. She learned coping skills that should help with her substance dependence issues and cope with her depressive symptoms after discharge.   For her depression, Judy [name] was ordered and received Lexapro 20 mg daily for depression, Mirtazapine 30 mg Q bedtime for depression/insomnia and Ambien 10 mg Q bedtime for insomnia. She also received medication management for her other medical issues that she presented. She tolerated her treatment regimen without any significant adverse effects reported. Judy [name] came to the providers this am and requested to be discharged. She states that her mood is better and has no more withdrawal symptoms of alcohol.  Judy [name] is being discharged to follow-up care at the [facility] [place], [place] for medication [place]. She has been referred to the [facility] to continue substance abuse treatment. Upon discharge, she adamantly denies any SIHI, AVH, delusional thoughts, paranoia and or  withdrawal symptoms. She received 14 days worth supply samples of her [facility] discharge medications. [facility] with all personal belongings in no apparent distress. Transportation per boy-friend.  Consults:  psychiatry  Significant Diagnostic Studies:  labs: Reviewed current lab vlaues, no changes  Discharge Vitals:   Blood pressure 113/75, pulse 96, temperature 97.5 F (36.4 C), temperature source Oral, resp. rate 16, height 5\' 3"  (1.6 m), weight 46.72 kg (103 lb), last menstrual period 05/14/2013. Body mass index is 18.25 kg/(m^2). Lab Results:   No results found for this or any previous visit (from the past 72 hour(s)).  Physical Findings: AIMS: Facial and Oral Movements Muscles of Facial Expression: None, normal Lips and Perioral Area: None, normal Jaw: None, normal Tongue: None, normal,Extremity Movements Upper (arms, wrists, hands, fingers): None, normal Lower (legs, knees, ankles, toes): None, normal, Trunk Movements Neck, shoulders, hips: None, normal, Overall Severity Severity of abnormal movements (highest score from questions above): None, normal Incapacitation due to abnormal movements: None, normal Patient's awareness of abnormal movements (rate only patient's report): No Awareness,    CIWA:  CIWA-Ar Total: 0 COWS:     Psychiatric Specialty Exam: See Psychiatric Specialty Exam and Suicide Risk Assessment completed by Attending Physician prior to discharge.  Discharge destination:  Other:  Home, then to [facility] 05/22/13  Is patient on multiple antipsychotic therapies at  discharge:  No   Has Patient had three or more failed trials of antipsychotic monotherapy by history:  No  Recommended Plan for Multiple Antipsychotic Therapies: NA     Medication List    STOP taking these medications       potassium chloride 10 MEQ tablet  Commonly known as:  K-DUR      TAKE these medications     Indication   ciprofloxacin 500 MG tablet  Commonly known as:   CIPRO  Take 1 tablet (500 mg total) by mouth 2 (two) times daily. For UTI   Indication:  urinary tract infection     escitalopram 20 MG tablet  Commonly known as:  LEXAPRO  Take 1 tablet (20 mg total) by mouth daily. For depression   Indication:  Depression     mirtazapine 30 MG tablet  Commonly known as:  REMERON  Take 1 tablet (30 mg total) by mouth at bedtime. For depression/sleep   Indication:  Major Depressive Disorder     pantoprazole 40 MG tablet  Commonly known as:  PROTONIX  Take 1 tablet (40 mg total) by mouth daily. For acid reflux   Indication:  Gastroesophageal Reflux Disease     zolpidem 10 MG tablet  Commonly known as:  AMBIEN  Take 1 tablet (10 mg total) by mouth at bedtime as needed for sleep.   Indication:  Trouble Sleeping       Follow-up Information   Follow up with Anice Paganini On 05/22/2013. [facility] followup appt/medication management-walk in between 8am-10am. Referral number: [phone])    Contact information:   [address] [place], [place] [address] Phone: [phone] Fax: [phone]      Follow up with [facility]. (If interested in admission into this facility, please call [phone] for phone interview. )    Contact information:   [address] [place], [place] [address] Phone: [phone] Fax: [phone]     Follow-up recommendations:  Activity:  As tolerated Diet: As recommended by your primary care doctor. Keep all scheduled follow-up appointments as recommended. Continue to work your relapse prevention plan Comments:  Take all your medications as prescribed by your mental healthcare provider. Report any adverse effects and or reactions from your medicines to your outpatient provider promptly. Patient is instructed and cautioned to not engage in alcohol and or illegal drug use while on prescription medicines. In the event of worsening symptoms, patient is instructed to call the crisis hotline, 911 and or go  to the nearest ED for appropriate evaluation and treatment of symptoms. Follow-up with your primary care provider for your other medical issues, concerns and or health care needs.   Total Discharge Time:  Greater than 30 minutes.  Signed: Sanjuana Kava, PMHNP-BC 05/18/2013, 9:33 AM Agree with assessment and plan Reymundo Poll. Dub Mikes, M.D.

## 2013-05-17 NOTE — Progress Notes (Signed)
Patient ID: Judy SimonsRenisha Shonte [name], female   DOB: [birth year], 33 y.o.   MRN: [id number] She has stayed in bed till 11:00   Up and went to recreational group and on return she was seen by MD. Medication was offered again She stated " I am going home and I told you before I did not want any medications".

## 2013-05-17 NOTE — Progress Notes (Signed)
Patient ID: Judy SimonsRenisha Shonte [name], female   DOB: 12-22-1980, 33 y.o.   MRN: [id number] She has refused her AM medication  3 times. Says she did not want take the medication, ust wanted to sleep. Dr Dub MikesLugo is aware of her not taking the medication.

## 2013-05-17 NOTE — Progress Notes (Signed)
Patient ID: Judy SimonsRenisha Shonte [name], female   DOB: 1980-12-29, 33 y.o.   MRN: [id number] She has been discharged home and a friend picked her up. She voiced understanding of discharge instruction and of the follow up plan. She denies thoughts of SI  And all her belonging were taken home. She refused to wait for her sample medications stating " I have all of them at home".

## 2013-05-19 NOTE — Progress Notes (Signed)
Patient Discharge Instructions:  After Visit Summary (AVS):   Faxed to:  05/19/13 Discharge Summary Note:   Faxed to:  05/19/13 Psychiatric Admission Assessment Note:   Faxed to:  05/19/13 Suicide Risk Assessment - Discharge Assessment:   Faxed to:  05/19/13 Faxed/Sent to the Next Level Care provider:  05/19/13 Faxed to [facility] @ [phone] Faxed to [facility] @ [phone]  Jerelene ReddenSheena E [place], 05/19/2013, 2:59 PM

## 2013-06-13 NOTE — Clinical Social Work Note (Addendum)
CSW received call from OsgoodRenisha [facility]. CSW explained process. Pt refused to go to ED and demanded to speak with someone that would admit her immediately. Pt also asked about her referral to [facility] that had been rejected due to pt not meeting criteria. (she refused to complete application during last admission and had called CSW after her d/c, requesting that application be sent to facility.  This pt has a history of admitting to the hospital at the end of the month stating that she wants treatment, then will demand to discharge abruptly when her check comes-she has been heard on the phone stating this in the past while on the unit. Pt has history of abusing services offered by [facility]. Please consider this information prior to admitting pt in the future. (review previous notes by MD, RN, and CSW)  [facility] Smart, LCSWA 06/13/2013 3:08 PM

## 2013-08-20 ENCOUNTER — Emergency Department (HOSPITAL_COMMUNITY): Payer: MEDICAID

## 2013-08-20 ENCOUNTER — Encounter (HOSPITAL_COMMUNITY): Payer: Self-pay | Admitting: Emergency Medicine

## 2013-08-20 ENCOUNTER — Emergency Department (HOSPITAL_COMMUNITY)
Admission: EM | Admit: 2013-08-20 | Discharge: 2013-08-22 | Disposition: A | Discharge status: Home / Self Care | Payer: MEDICAID | Attending: Emergency Medicine | Admitting: Emergency Medicine

## 2013-08-20 DIAGNOSIS — F17200 Nicotine dependence, unspecified, uncomplicated: Secondary | ICD-10-CM | POA: Insufficient documentation

## 2013-08-20 DIAGNOSIS — N390 Urinary tract infection, site not specified: Secondary | ICD-10-CM | POA: Insufficient documentation

## 2013-08-20 DIAGNOSIS — O211 Hyperemesis gravidarum with metabolic disturbance: Secondary | ICD-10-CM | POA: Insufficient documentation

## 2013-08-20 DIAGNOSIS — Z880 Allergy status to penicillin: Secondary | ICD-10-CM | POA: Insufficient documentation

## 2013-08-20 DIAGNOSIS — O99320 Drug use complicating pregnancy, unspecified trimester: Secondary | ICD-10-CM | POA: Insufficient documentation

## 2013-08-20 DIAGNOSIS — F1010 Alcohol abuse, uncomplicated: Secondary | ICD-10-CM | POA: Insufficient documentation

## 2013-08-20 DIAGNOSIS — Z3490 Encounter for supervision of normal pregnancy, unspecified, unspecified trimester: Secondary | ICD-10-CM

## 2013-08-20 DIAGNOSIS — F1420 Cocaine dependence, uncomplicated: Secondary | ICD-10-CM

## 2013-08-20 DIAGNOSIS — Z882 Allergy status to sulfonamides status: Secondary | ICD-10-CM | POA: Insufficient documentation

## 2013-08-20 DIAGNOSIS — R1110 Vomiting, unspecified: Secondary | ICD-10-CM

## 2013-08-20 DIAGNOSIS — Z881 Allergy status to other antibiotic agents status: Secondary | ICD-10-CM | POA: Insufficient documentation

## 2013-08-20 DIAGNOSIS — F1120 Opioid dependence, uncomplicated: Secondary | ICD-10-CM | POA: Insufficient documentation

## 2013-08-20 DIAGNOSIS — F1020 Alcohol dependence, uncomplicated: Secondary | ICD-10-CM

## 2013-08-20 DIAGNOSIS — E876 Hypokalemia: Secondary | ICD-10-CM

## 2013-08-20 DIAGNOSIS — Z888 Allergy status to other drugs, medicaments and biological substances status: Secondary | ICD-10-CM | POA: Insufficient documentation

## 2013-08-20 DIAGNOSIS — F1920 Other psychoactive substance dependence, uncomplicated: Secondary | ICD-10-CM | POA: Insufficient documentation

## 2013-08-20 LAB — URINALYSIS, ROUTINE W REFLEX MICROSCOPIC
Bilirubin Urine: NEGATIVE
GLUCOSE, UA: NEGATIVE mg/dL
HGB URINE DIPSTICK: NEGATIVE
KETONES UR: NEGATIVE mg/dL
Nitrite: NEGATIVE
PROTEIN: 30 mg/dL — AB
Specific Gravity, Urine: 1.017 (ref 1.005–1.030)
Urobilinogen, UA: 1 mg/dL (ref 0.0–1.0)
pH: 8.5 — ABNORMAL HIGH (ref 5.0–8.0)

## 2013-08-20 LAB — URINE MICROSCOPIC-ADD ON

## 2013-08-20 LAB — RAPID URINE DRUG SCREEN, HOSP PERFORMED
AMPHETAMINES: NOT DETECTED
Barbiturates: NOT DETECTED
Benzodiazepines: NOT DETECTED
COCAINE: POSITIVE — AB
OPIATES: NOT DETECTED
TETRAHYDROCANNABINOL: NOT DETECTED

## 2013-08-20 LAB — COMPREHENSIVE METABOLIC PANEL
ALBUMIN: 3.5 g/dL (ref 3.5–5.2)
ALT: 16 U/L (ref 0–35)
AST: 32 U/L (ref 0–37)
Alkaline Phosphatase: 44 U/L (ref 39–117)
BUN: 6 mg/dL (ref 6–23)
CO2: 28 meq/L (ref 19–32)
Calcium: 9.3 mg/dL (ref 8.4–10.5)
Chloride: 99 mEq/L (ref 96–112)
Creatinine, Ser: 0.83 mg/dL (ref 0.50–1.10)
GFR calc Af Amer: >90 mL/min (ref >90)
GFR calc non Af Amer: >90 mL/min (ref >90)
Glucose, Bld: 50 mg/dL — ABNORMAL LOW (ref 70–99)
Potassium: 2.8 mEq/L — CL (ref 3.7–5.3)
SODIUM: 141 meq/L (ref 137–147)
Total Bilirubin: <0.2 mg/dL — ABNORMAL LOW (ref 0.3–1.2)
Total Protein: 7 g/dL (ref 6.0–8.3)

## 2013-08-20 LAB — SALICYLATE LEVEL: Salicylate Lvl: <2 mg/dL — ABNORMAL LOW (ref 2.8–20.0)

## 2013-08-20 LAB — CBC
HCT: 32.7 % — ABNORMAL LOW (ref 36.0–46.0)
Hemoglobin: 11.7 g/dL — ABNORMAL LOW (ref 12.0–15.0)
MCH: 31.8 pg (ref 26.0–34.0)
MCHC: 35.8 g/dL (ref 30.0–36.0)
MCV: 88.9 fL (ref 78.0–100.0)
PLATELETS: 240 10*3/uL (ref 150–400)
RBC: 3.68 MIL/uL — AB (ref 3.87–5.11)
RDW: 14.3 % (ref 11.5–15.5)
WBC: 10.6 10*3/uL — ABNORMAL HIGH (ref 4.0–10.5)

## 2013-08-20 LAB — HCG, QUANTITATIVE, PREGNANCY: hCG, Beta Chain, Quant, S: 80755 m[IU]/mL — ABNORMAL HIGH (ref <5)

## 2013-08-20 LAB — ACETAMINOPHEN LEVEL: Acetaminophen (Tylenol), Serum: <15 ug/mL (ref 10–30)

## 2013-08-20 LAB — LIPASE, BLOOD: Lipase: 18 U/L (ref 11–59)

## 2013-08-20 LAB — ETHANOL: Alcohol, Ethyl (B): 78 mg/dL — ABNORMAL HIGH (ref 0–11)

## 2013-08-20 LAB — PREGNANCY, URINE: Preg Test, Ur: POSITIVE — AB

## 2013-08-20 MED ORDER — SODIUM CHLORIDE 0.9 % IV BOLUS (SEPSIS)
1000.0000 mL | Freq: Once | INTRAVENOUS | Status: AC
  Administered 2013-08-20: 1000 mL via INTRAVENOUS

## 2013-08-20 MED ORDER — POTASSIUM CHLORIDE 10 MEQ/100ML IV SOLN
10.0000 meq | INTRAVENOUS | Status: AC
  Administered 2013-08-20 – 2013-08-21 (×2): 10 meq via INTRAVENOUS
  Filled 2013-08-20 (×3): qty 100

## 2013-08-20 MED ORDER — NITROFURANTOIN MONOHYD MACRO 100 MG PO CAPS
100.0000 mg | ORAL_CAPSULE | Freq: Once | ORAL | Status: AC
  Administered 2013-08-20: 100 mg via ORAL
  Filled 2013-08-20: qty 1

## 2013-08-20 NOTE — ED Notes (Signed)
Pt refusing pelvic exam performed by Ed PA at this time

## 2013-08-20 NOTE — ED Notes (Signed)
The pt is taking drugs and alcohol and wants detox.  C/o being depressed.  The pt has been taking cocaine  1700 last use.  Alcohol 45 minutes ago.  Crying un-co-operative.  lmp feb.  She states she is 3 months preg

## 2013-08-20 NOTE — ED Provider Notes (Addendum)
CSN: [id number]     Arrival date & time 08/20/13  2043 History   None    Chief Complaint  Patient presents with  . Psychiatric Evaluation     (Consider location/radiation/quality/duration/timing/severity/associated sxs/prior Treatment) HPI  Judy [name] is a 33 y.o. female requesting detox from alcohol and cocaine. Patient is 3 months pregnant, complains of depression. Denies suicidal ideation, homicidal ideation, audio or visual hallucinations. Patient states that she drinks daily, Gin and margaritas, several liters a day. Has history of DTs, no seizures. Patient endorses multiple episodes of nonbloody, nonbilious, coffee-ground emesis this a.m. She denies chest pain, shortness of breath, vaginal bleeding, or abnormal vaginal discharge. She endorses a moderate crampy lower abdominal pain. No pain medication prior to arrival. Patient has inconsistent prenatal care in [facility]. She has missed her appointment for ultrasound.  Past Medical History  Diagnosis Date  . Depression   . Cocaine abuse   . Polysubstance abuse   . Anxiety   . Depression   . Alcohol abuse    History reviewed. No pertinent past surgical history. No family history on file. History  Substance Use Topics  . Smoking status: Current Every Day Smoker -- 0.25 packs/day for 10 years    Types: Cigarettes  . Smokeless tobacco: Not on file  . Alcohol Use: Yes     Comment: drinks vodka daily. Unable to articulate amount.   OB History   Grav Para Term Preterm Abortions TAB SAB Ect Mult Living                 Review of Systems  10 systems reviewed and found to be negative, except as noted in the HPI.  Allergies  Cephalosporins; Penicillins; Sulfa antibiotics; and Zofran  Home Medications   Prior to Admission medications   Medication Sig Start Date End Date Taking? Authorizing Provider  ciprofloxacin (CIPRO) 500 MG tablet Take 1 tablet (500 mg total) by mouth 2 (two) times daily. For UTI 05/17/13    Sanjuana Kava, NP  escitalopram (LEXAPRO) 20 MG tablet Take 1 tablet (20 mg total) by mouth daily. For depression 05/17/13   Sanjuana Kava, NP  mirtazapine (REMERON) 30 MG tablet Take 1 tablet (30 mg total) by mouth at bedtime. For depression/sleep 05/17/13   Sanjuana Kava, NP  pantoprazole (PROTONIX) 40 MG tablet Take 1 tablet (40 mg total) by mouth daily. For acid reflux 05/17/13   Sanjuana Kava, NP  zolpidem (AMBIEN) 10 MG tablet Take 1 tablet (10 mg total) by mouth at bedtime as needed for sleep. 05/17/13 06/16/13  Sanjuana Kava, NP   BP 100/55  Pulse 73  Temp(Src) 98.2 F (36.8 C) (Oral)  Resp 20  SpO2 100%  LMP 05/20/2013 Physical Exam  Nursing note and vitals reviewed. Constitutional: She is oriented to person, place, and time. She appears well-developed and well-nourished.  Vomiting, tearful  HENT:  Head: Normocephalic.  Mouth/Throat: Oropharynx is clear and moist.  Dry mucous membranes  Eyes: Conjunctivae and EOM are normal. Pupils are equal, round, and reactive to light.  Neck: Normal range of motion. Neck supple.  Cardiovascular: Normal rate, regular rhythm and intact distal pulses.   Pulmonary/Chest: Effort normal and breath sounds normal. No stridor. No respiratory distress. She has no wheezes. She has no rales. She exhibits no tenderness.  Abdominal: Soft. She exhibits no distension and no mass. There is tenderness. There is no rebound and no guarding.  Gravid, diffusely tender to palpation, no guarding or rebound  Murphy sign negative, no tenderness to palpation over McBurney's point, Rovsings, Psoas and obturator all negative.   Musculoskeletal: Normal range of motion.  Neurological: She is alert and oriented to person, place, and time.  No tongue Fasiculations  Skin: Skin is warm and dry.  Psychiatric: She expresses no homicidal and no suicidal ideation.    ED Course  Procedures (including critical care time) Labs Review Labs Reviewed  CBC - Abnormal; Notable for  the following:    WBC 10.6 (*)    RBC 3.68 (*)    Hemoglobin 11.7 (*)    HCT 32.7 (*)    All other components within normal limits  COMPREHENSIVE METABOLIC PANEL - Abnormal; Notable for the following:    Potassium 2.8 (*)    Glucose, Bld 50 (*)    Total Bilirubin <0.2 (*)    All other components within normal limits  ETHANOL - Abnormal; Notable for the following:    Alcohol, Ethyl (B) 78 (*)    All other components within normal limits  SALICYLATE LEVEL - Abnormal; Notable for the following:    Salicylate Lvl <2.0 (*)    All other components within normal limits  URINE RAPID DRUG SCREEN (HOSP PERFORMED) - Abnormal; Notable for the following:    Cocaine POSITIVE (*)    All other components within normal limits  PREGNANCY, URINE - Abnormal; Notable for the following:    Preg Test, Ur POSITIVE (*)    All other components within normal limits  URINALYSIS, ROUTINE W REFLEX MICROSCOPIC - Abnormal; Notable for the following:    APPearance CLOUDY (*)    pH 8.5 (*)    Protein, ur 30 (*)    Leukocytes, UA SMALL (*)    All other components within normal limits  HCG, QUANTITATIVE, PREGNANCY - Abnormal; Notable for the following:    hCG, Beta Chain, Quant, S 20254 (*)    All other components within normal limits  URINE MICROSCOPIC-ADD ON - Abnormal; Notable for the following:    Squamous Epithelial / LPF MANY (*)    Bacteria, UA MANY (*)    All other components within normal limits  I-STAT CHEM 8, ED - Abnormal; Notable for the following:    Sodium 136 (*)    Potassium 3.3 (*)    BUN <3 (*)    Glucose, Bld 101 (*)    Calcium, Ion 1.03 (*)    Hemoglobin 9.9 (*)    HCT 29.0 (*)    All other components within normal limits  URINE CULTURE  ACETAMINOPHEN LEVEL  LIPASE, BLOOD  RPR  HIV ANTIBODY (ROUTINE TESTING)    Imaging Review US Ob Limited  08/20/2013   CLINICAL DATA:  Abdominal pain and vaginal bleeding. Pregnant patient.  EXAM: LIMITED OBSTETRIC ULTRASOUND  FINDINGS: Number  of Fetuses: 1  Heart Rate:  153 bpm  Movement: Yes  Presentation: Breech  Placental Location: Anterior  Previa: Low lying placenta, but no previa.  Amniotic Fluid (Subjective):  Within normal limits.  BPD:  2.6cm 14w  3d  MATERNAL FINDINGS:  Cervix:  Appears closed.  Uterus/Adnexae: No uterine mass. No adnexal abnormality. No free fluid.  IMPRESSION: 1. Single live intrauterine pregnancy with a measured gestational age of [redacted] weeks and 3 days. 2. No emergent fetal or maternal complication.  This exam is performed on an emergent basis and does not comprehensively evaluate fetal size, dating, or anatomy; follow-up complete OB US should be considered if further fetal assessment is warranted.   Electronically Signed  By: Amie [place]avid  Ormond M.D.   On: 08/20/2013 23:43     EKG Interpretation None      MDM   Final diagnoses:  Alcohol dependence  Cocaine dependence  Pregnancy  UTI (lower urinary tract infection)  Emesis  Hypokalemia    Filed Vitals:   08/21/13 0400 08/21/13 0500 08/21/13 0600 08/21/13 0700  BP: 110/60 105/51 105/55 100/55  Pulse: 81 91 88 73  Temp:      TempSrc:      Resp:      SpO2: 100% 100% 100% 100%    Medications  potassium chloride 10 mEq in 100 mL IVPB (0 mEq Intravenous Stopped 08/21/13 0220)  alum & mag hydroxide-simeth (MAALOX/MYLANTA) 200-200-20 MG/5ML suspension 30 mL (not administered)  acetaminophen (TYLENOL) tablet 650 mg (not administered)  LORazepam (ATIVAN) tablet 0-4 mg (0 mg Oral Not Given 08/21/13 16100625)  thiamine (VITAMIN B-1) tablet 100 mg (not administered)    Or  thiamine (B-1) injection 100 mg (not administered)  nitrofurantoin (macrocrystal-monohydrate) (MACROBID) capsule 100 mg (100 mg Oral Not Given 08/21/13 0025)  prenatal multivitamin tablet 1 tablet (not administered)  potassium chloride SA (K-DUR,KLOR-CON) CR tablet 40 mEq (not administered)  sodium chloride 0.9 % bolus 1,000 mL (0 mLs Intravenous Stopped 08/21/13 0022)  nitrofurantoin  (macrocrystal-monohydrate) (MACROBID) capsule 100 mg (100 mg Oral Given 08/20/13 2301)  potassium chloride SA (K-DUR,KLOR-CON) CR tablet 40 mEq (40 mEq Oral Given 08/21/13 0228)  potassium chloride 10 mEq in 100 mL IVPB (0 mEq Intravenous Stopped 08/21/13 0300)    Judy [name] is a 33 y.o. female 3 months pregnant requesting detox from alcohol and cocaine. Patient actively vomiting in the ED. Potassium found to be very low at 2.8. Patient will be repleted with 3 runs by IV. She is refusing pelvic exam. Amenable to ultrasound which shows a normal intrauterine pregnancy at 14 weeks. Urinalysis consistent with infection will treat with Macrobid. Alcohol level is 78.  Patient is medically cleared for psychiatric evaluation will be transferred to the psych ED. TTS consulted, home meds and psych standard holding orders placed.   Discussed case with TTS Forrest: He is working in arranging admission to psychiatric program to take pregnant women. Thinks he can have her placed in the next day.   Wynetta Emeryicole Gaige Sebo, PA-C 08/21/13 0749  Wynetta EmeryNicole Marshae Azam, PA-C 08/21/13 1547

## 2013-08-21 DIAGNOSIS — F1020 Alcohol dependence, uncomplicated: Secondary | ICD-10-CM

## 2013-08-21 LAB — I-STAT CHEM 8, ED
BUN: <3 mg/dL — ABNORMAL LOW (ref 6–23)
Calcium, Ion: 1.03 mmol/L — ABNORMAL LOW (ref 1.12–1.23)
Chloride: 104 mEq/L (ref 96–112)
Creatinine, Ser: 0.7 mg/dL (ref 0.50–1.10)
Glucose, Bld: 101 mg/dL — ABNORMAL HIGH (ref 70–99)
HEMATOCRIT: 29 % — AB (ref 36.0–46.0)
Hemoglobin: 9.9 g/dL — ABNORMAL LOW (ref 12.0–15.0)
POTASSIUM: 3.3 meq/L — AB (ref 3.7–5.3)
Sodium: 136 mEq/L — ABNORMAL LOW (ref 137–147)
TCO2: 24 mmol/L (ref 0–100)

## 2013-08-21 LAB — HIV ANTIBODY (ROUTINE TESTING W REFLEX): HIV 1&2 Ab, 4th Generation: NONREACTIVE

## 2013-08-21 LAB — RPR

## 2013-08-21 MED ORDER — POTASSIUM CHLORIDE CRYS ER 20 MEQ PO TBCR: 40.0000 meq | EXTENDED_RELEASE_TABLET | Freq: Every day | ORAL | Status: DC

## 2013-08-21 MED ORDER — RANITIDINE HCL 150 MG PO TABS
150.0000 mg | ORAL_TABLET | Freq: Two times a day (BID) | ORAL | Status: DC
  Filled 2013-08-21 (×2): qty 1

## 2013-08-21 MED ORDER — POTASSIUM CHLORIDE CRYS ER 20 MEQ PO TBCR
40.0000 meq | EXTENDED_RELEASE_TABLET | Freq: Once | ORAL | Status: AC
  Administered 2013-08-21: 40 meq via ORAL
  Filled 2013-08-21: qty 2

## 2013-08-21 MED ORDER — LORAZEPAM 1 MG PO TABS: 0.0000 mg | ORAL_TABLET | Freq: Two times a day (BID) | ORAL | Status: DC

## 2013-08-21 MED ORDER — NITROFURANTOIN MONOHYD MACRO 100 MG PO CAPS
100.0000 mg | ORAL_CAPSULE | Freq: Once | ORAL | Status: DC
  Filled 2013-08-21: qty 1

## 2013-08-21 MED ORDER — RANITIDINE HCL 150 MG PO CAPS
300.0000 mg | ORAL_CAPSULE | Freq: Every day | ORAL | Status: DC
  Filled 2013-08-21: qty 2

## 2013-08-21 MED ORDER — VITAMIN B-1 100 MG PO TABS
100.0000 mg | ORAL_TABLET | Freq: Every day | ORAL | Status: DC
  Administered 2013-08-21 – 2013-08-22 (×2): 100 mg via ORAL
  Filled 2013-08-21 (×2): qty 1

## 2013-08-21 MED ORDER — FAMOTIDINE 20 MG PO TABS
20.0000 mg | ORAL_TABLET | Freq: Every day | ORAL | Status: DC
  Filled 2013-08-21: qty 1

## 2013-08-21 MED ORDER — ACETAMINOPHEN 325 MG PO TABS
650.0000 mg | ORAL_TABLET | ORAL | Status: DC | PRN
  Administered 2013-08-22: 650 mg via ORAL
  Filled 2013-08-21: qty 2

## 2013-08-21 MED ORDER — THIAMINE HCL 100 MG/ML IJ SOLN
100.0000 mg | Freq: Every day | INTRAMUSCULAR | Status: DC
Start: 2013-08-21 — End: 2013-08-21

## 2013-08-21 MED ORDER — POTASSIUM CHLORIDE CRYS ER 20 MEQ PO TBCR
20.0000 meq | EXTENDED_RELEASE_TABLET | Freq: Two times a day (BID) | ORAL | Status: DC
  Administered 2013-08-21 (×2): 20 meq via ORAL
  Filled 2013-08-21 (×2): qty 1

## 2013-08-21 MED ORDER — NITROFURANTOIN MONOHYD MACRO 100 MG PO CAPS
100.0000 mg | ORAL_CAPSULE | Freq: Two times a day (BID) | ORAL | Status: DC
  Administered 2013-08-21 – 2013-08-22 (×3): 100 mg via ORAL
  Filled 2013-08-21 (×5): qty 1

## 2013-08-21 MED ORDER — LORAZEPAM 1 MG PO TABS
0.0000 mg | ORAL_TABLET | Freq: Four times a day (QID) | ORAL | Status: DC
  Administered 2013-08-21: 1 mg via ORAL
  Filled 2013-08-21: qty 1

## 2013-08-21 MED ORDER — POTASSIUM CHLORIDE 10 MEQ/100ML IV SOLN
10.0000 meq | Freq: Once | INTRAVENOUS | Status: AC
  Administered 2013-08-21: 10 meq via INTRAVENOUS

## 2013-08-21 MED ORDER — ALUM & MAG HYDROXIDE-SIMETH 200-200-20 MG/5ML PO SUSP: 30.0000 mL | ORAL | Status: DC | PRN

## 2013-08-21 MED ORDER — RANITIDINE HCL 150 MG PO TABS
300.0000 mg | ORAL_TABLET | Freq: Every day | ORAL | Status: DC
  Administered 2013-08-21 – 2013-08-22 (×2): 300 mg via ORAL
  Filled 2013-08-21 (×2): qty 2

## 2013-08-21 MED ORDER — PROMETHAZINE HCL 25 MG PO TABS
25.0000 mg | ORAL_TABLET | Freq: Four times a day (QID) | ORAL | Status: DC | PRN
  Administered 2013-08-21 – 2013-08-22 (×3): 25 mg via ORAL
  Filled 2013-08-21 (×5): qty 1

## 2013-08-21 MED ORDER — PRENATAL MULTIVITAMIN CH
1.0000 | ORAL_TABLET | Freq: Every day | ORAL | Status: DC
  Administered 2013-08-21: 1 via ORAL
  Filled 2013-08-21 (×2): qty 1

## 2013-08-21 NOTE — ED Notes (Signed)
Called pt room. She is requesting more warm blankets. She has 4 blankets on the floor. When asked her about the blankets in the floor. She states "why dont you just leave me alone and bring me warm blankets"

## 2013-08-21 NOTE — ED Notes (Signed)
Patient up to restroom. She has urinated all around the toilet and has thrown bunches of toilet paper and paper towels in toilet. Pt given a fresh gown and socks. Offered pt a vomit bag and she  Refuses. She then throws up in the trash can,.

## 2013-08-21 NOTE — Consult Note (Cosign Needed)
Telepsych Consultation   Reason for Consult:  Referral for psychiatric evaluation Referring Physician:  Pisciotta, PA-C Judy [name] is an 33 y.o. female.  Assessment: AXIS I:  Substance Abuse and Alcohol Use Disorder, Severe AXIS II:  Deferred AXIS III:   Past Medical History  Diagnosis Date  . Depression   . Cocaine abuse   . Polysubstance abuse   . Anxiety   . Depression   . Alcohol abuse    AXIS IV:  other psychosocial or environmental problems and problems related to social environment AXIS V:  31-40 impairment in reality testing  Plan:  Recommend inpatient detoxification treatment when medically cleared  Subjective:   Judy [name] is a 33 y.o. female patient who presented [facility] requesting detox from drugs and alcohol and c/o being depressed.  Patient states "I am 3 months pregnant and I've been using since I was 17." Patient states she wants detox from alcohol and drugs. States "its different this time 'cause of the baby." Patient reports that she has been using alcohol, cocaine and oxycontin.  She states she has been using alcohol "all day today. Whatever I could get my hands on; wine, liquor, beer, whatever." Last cocaine use was around 2pm.  Patient reports that she has had inpatient detox before "but most of the time I've left thinking I was ok or I could do it on my own. This time I'll leave when the doctor says I can leave."  Patient states "this disease of addiction jumps on me and I use stuff."  Patient denies SI, HI and AVH.  Patient states that she has not had her Lexapro, Remeron or prenatal vitamin in 2 weeks.  Patient states she has had one prenatal visit. Currently receiving care at the pregnancy center at [facility] and [facility] in [place], [place]. Patient states she missed her appointment for her ultrasound, however, the patient did have an ultrasound in the ED which showed a single live intrauterine pregnancy with a  measured gestational  age of [redacted] weeks and 3 days.    HPI:  33 yo female requesting detox from drugs and alcohol HPI Elements:   Location:  [place]. Quality:  Alcohol and Substance Abuse. Severity:  Severe. Timing:  Daily. Duration:  Onset drug and alcohol use 18 years ago. Context:  Stressor.  Past Psychiatric History: Past Medical History  Diagnosis Date  . Depression   . Cocaine abuse   . Polysubstance abuse   . Anxiety   . Depression   . Alcohol abuse     reports that she has been smoking Cigarettes.  She has a 2.5 pack-year smoking history. She does not have any smokeless tobacco history on file. She reports that she drinks alcohol. She reports that she uses illicit drugs (Cocaine and "Crack" cocaine). No family history on file.       Allergies:   Allergies  Allergen Reactions  . Cephalosporins Hives  . Penicillins Hives  . Sulfa Antibiotics Nausea Only  . Zofran [Ondansetron Hcl] Nausea And Vomiting    ACT Assessment Complete:  No:   Past Psychiatric History: Yes Diagnosis:  Alcohol Abuse, Polysubstance Abuse  Hospitalizations:  04/2013, 02/2013, & 01/2013 [facility]; [facility] 2014, [facility] 2002, [place] 2002, [facility]  Outpatient Care:  [facility], [place]  Substance Abuse Care:  Referred to [facility]  Self-Mutilation:  Denies  Suicidal Attempts:  Denies  Homicidal Behaviors:  Denies   Violent Behaviors:  No  Place of Residence:  [place] Marital Status:  Single Employed/Unemployed:  Unemployed Education:  [employer] Family Supports:  Boyfriend  Objective: Blood pressure 107/63, pulse 74, temperature 98.2 F (36.8 C), temperature source Oral, resp. rate 20, last menstrual period 05/20/2013, SpO2 100.00%.There is no weight on file to calculate BMI. Results for orders placed during the hospital encounter of 08/20/13 (from the past 72 hour(s))  ACETAMINOPHEN LEVEL     Status: None   Collection Time    08/20/13  8:45 PM      Result Value Ref Range    Acetaminophen (Tylenol), Serum <15.0  10 - 30 ug/mL   Comment:            THERAPEUTIC CONCENTRATIONS VARY     SIGNIFICANTLY. A RANGE OF 10-30     ug/mL MAY BE AN EFFECTIVE     CONCENTRATION FOR MANY PATIENTS.     HOWEVER, SOME ARE BEST TREATED     AT CONCENTRATIONS OUTSIDE THIS     RANGE.     ACETAMINOPHEN CONCENTRATIONS     >150 ug/mL AT 4 HOURS AFTER     INGESTION AND >50 ug/mL AT 12     HOURS AFTER INGESTION ARE     OFTEN ASSOCIATED WITH TOXIC     REACTIONS.  CBC     Status: Abnormal   Collection Time    08/20/13  8:45 PM      Result Value Ref Range   WBC 10.6 (*) 4.0 - 10.5 K/uL   RBC 3.68 (*) 3.87 - 5.11 MIL/uL   Hemoglobin 11.7 (*) 12.0 - 15.0 g/dL   HCT 32.7 (*) 36.0 - 46.0 %   MCV 88.9  78.0 - 100.0 fL   MCH 31.8  26.0 - 34.0 pg   MCHC 35.8  30.0 - 36.0 g/dL   RDW 14.3  11.5 - 15.5 %   Platelets 240  150 - 400 K/uL  COMPREHENSIVE METABOLIC PANEL     Status: Abnormal   Collection Time    08/20/13  8:45 PM      Result Value Ref Range   Sodium 141  137 - 147 mEq/L   Potassium 2.8 (*) 3.7 - 5.3 mEq/L   Comment: CRITICAL RESULT CALLED TO, READ BACK BY AND VERIFIED WITH:     YUAL K,RN 08/20/13 2140 WAYK   Chloride 99  96 - 112 mEq/L   CO2 28  19 - 32 mEq/L   Glucose, Bld 50 (*) 70 - 99 mg/dL   BUN 6  6 - 23 mg/dL   Creatinine, Ser 0.83  0.50 - 1.10 mg/dL   Calcium 9.3  8.4 - 10.5 mg/dL   Total Protein 7.0  6.0 - 8.3 g/dL   Albumin 3.5  3.5 - 5.2 g/dL   AST 32  0 - 37 U/L   ALT 16  0 - 35 U/L   Alkaline Phosphatase 44  39 - 117 U/L   Total Bilirubin <0.2 (*) 0.3 - 1.2 mg/dL   GFR calc non Af Amer >90  >90 mL/min   GFR calc Af Amer >90  >90 mL/min   Comment: (NOTE)     The eGFR has been calculated using the CKD EPI equation.     This calculation has not been validated in all clinical situations.     eGFR's persistently <90 mL/min signify possible Chronic Kidney     Disease.  ETHANOL     Status: Abnormal   Collection Time    08/20/13  8:45  PM      Result Value  Ref Range   Alcohol, Ethyl (B) 78 (*) 0 - 11 mg/dL   Comment:            LOWEST DETECTABLE LIMIT FOR     SERUM ALCOHOL IS 11 mg/dL     FOR MEDICAL PURPOSES ONLY  SALICYLATE LEVEL     Status: Abnormal   Collection Time    08/20/13  8:45 PM      Result Value Ref Range   Salicylate Lvl <3.2 (*) 2.8 - 20.0 mg/dL  HCG, QUANTITATIVE, PREGNANCY     Status: Abnormal   Collection Time    08/20/13  8:45 PM      Result Value Ref Range   hCG, Beta Chain, Quant, S 67124 (*) <5 mIU/mL   Comment:              GEST. AGE      CONC.  (mIU/mL)       <=1 WEEK        5 - 50         2 WEEKS       50 - 500         3 WEEKS       100 - 10,000         4 WEEKS     1,000 - 30,000         5 WEEKS     3,500 - 115,000       6-8 WEEKS     12,000 - 270,000        12 WEEKS     15,000 - 220,000                FEMALE AND NON-PREGNANT FEMALE:         LESS THAN 5 mIU/mL  LIPASE, BLOOD     Status: None   Collection Time    08/20/13  8:54 PM      Result Value Ref Range   Lipase 18  11 - 59 U/L  URINE RAPID DRUG SCREEN (HOSP PERFORMED)     Status: Abnormal   Collection Time    08/20/13  9:45 PM      Result Value Ref Range   Opiates NONE DETECTED  NONE DETECTED   Cocaine POSITIVE (*) NONE DETECTED   Benzodiazepines NONE DETECTED  NONE DETECTED   Amphetamines NONE DETECTED  NONE DETECTED   Tetrahydrocannabinol NONE DETECTED  NONE DETECTED   Barbiturates NONE DETECTED  NONE DETECTED   Comment:            DRUG SCREEN FOR MEDICAL PURPOSES     ONLY.  IF CONFIRMATION IS NEEDED     FOR ANY PURPOSE, NOTIFY LAB     WITHIN 5 DAYS.                LOWEST DETECTABLE LIMITS     FOR URINE DRUG SCREEN     Drug Class       Cutoff (ng/mL)     Amphetamine      1000     Barbiturate      200     Benzodiazepine   580     Tricyclics       998     Opiates          300     Cocaine          300     THC  50  PREGNANCY, URINE     Status: Abnormal   Collection Time    08/20/13  9:45 PM      Result Value Ref Range    Preg Test, Ur POSITIVE (*) NEGATIVE   Comment:            THE SENSITIVITY OF THIS     METHODOLOGY IS >20 mIU/mL.  URINALYSIS, ROUTINE W REFLEX MICROSCOPIC     Status: Abnormal   Collection Time    08/20/13  9:45 PM      Result Value Ref Range   Color, Urine YELLOW  YELLOW   APPearance CLOUDY (*) CLEAR   Specific Gravity, Urine 1.017  1.005 - 1.030   pH 8.5 (*) 5.0 - 8.0   Glucose, UA NEGATIVE  NEGATIVE mg/dL   Hgb urine dipstick NEGATIVE  NEGATIVE   Bilirubin Urine NEGATIVE  NEGATIVE   Ketones, ur NEGATIVE  NEGATIVE mg/dL   Protein, ur 30 (*) NEGATIVE mg/dL   Urobilinogen, UA 1.0  0.0 - 1.0 mg/dL   Nitrite NEGATIVE  NEGATIVE   Leukocytes, UA SMALL (*) NEGATIVE  URINE MICROSCOPIC-ADD ON     Status: Abnormal   Collection Time    08/20/13  9:45 PM      Result Value Ref Range   Squamous Epithelial / LPF MANY (*) RARE   WBC, UA 7-10  <3 WBC/hpf   Bacteria, UA MANY (*) RARE   Labs are reviewed and are pertinent for K+ 2.8, Glucose 50, ETOH 78, Beta HCG 80755, UDS positive for cocaine.  Current Facility-Administered Medications  Medication Dose Route Frequency Provider Last Rate Last Dose  . acetaminophen (TYLENOL) tablet 650 mg  650 mg Oral Q4H PRN Nicole Pisciotta, PA-C      . alum & mag hydroxide-simeth (MAALOX/MYLANTA) 200-200-20 MG/5ML suspension 30 mL  30 mL Oral PRN Monico Blitz, PA-C      . LORazepam (ATIVAN) tablet 0-4 mg  0-4 mg Oral 4 times per day Monico Blitz, PA-C      . nitrofurantoin (macrocrystal-monohydrate) (MACROBID) capsule 100 mg  100 mg Oral Once [employer], PA-C      . potassium chloride 10 mEq in 100 mL IVPB  10 mEq Intravenous Once Narda Bonds, RPH 100 mL/hr at 08/21/13 0227 10 mEq at 08/21/13 0227  . thiamine (VITAMIN B-1) tablet 100 mg  100 mg Oral Daily Nicole Pisciotta, PA-C       Or  . thiamine (B-1) injection 100 mg  100 mg Intravenous Daily Monico Blitz, PA-C       Current Outpatient Prescriptions  Medication Sig Dispense  Refill  . ciprofloxacin (CIPRO) 500 MG tablet Take 1 tablet (500 mg total) by mouth 2 (two) times daily. For UTI      . escitalopram (LEXAPRO) 20 MG tablet Take 1 tablet (20 mg total) by mouth daily. For depression  30 tablet  0  . mirtazapine (REMERON) 30 MG tablet Take 1 tablet (30 mg total) by mouth at bedtime. For depression/sleep  30 tablet  0  . pantoprazole (PROTONIX) 40 MG tablet Take 1 tablet (40 mg total) by mouth daily. For acid reflux      . zolpidem (AMBIEN) 10 MG tablet Take 1 tablet (10 mg total) by mouth at bedtime as needed for sleep.  5 tablet  0    Psychiatric Specialty Exam:     Blood pressure 107/63, pulse 74, temperature 98.2 F (36.8 C), temperature source Oral, resp. rate 20, last menstrual period 05/20/2013, SpO2 100.00%.There  is no weight on file to calculate BMI.  General Appearance: Disheveled  Eye Sport and exercise psychologist::  Fair  Speech:  Clear and Coherent  Volume:  Normal  Mood:  Anxious  Affect:  Congruent  Thought Process:  Coherent  Orientation:  Full (Time, Place, and Person)  Thought Content:  WDL  Suicidal Thoughts:  No  Homicidal Thoughts:  No  Memory:  Immediate;   Fair Recent;   Fair Remote;   Fair  Judgement:  Impaired  Insight:  Fair  Psychomotor Activity:  Normal  Concentration:  Fair  Recall:  Fair  Akathisia:  No  Handed:  Right  AIMS (if indicated):     Assets:  Desire for Improvement  Sleep:      Treatment Plan Summary: Recommend inpatient detoxification when medically cleared.  Disposition: Will seek patient placement at [facility] or [facility] given that patient is [redacted] weeks pregnant. Monico Blitz, PA-C updated on patient disposition at [phone].  Serena Colonel, FNP-BC 08/21/2013 2:31 AM

## 2013-08-21 NOTE — ED Notes (Signed)
Pt. Calm and cooperative at this time.

## 2013-08-21 NOTE — ED Notes (Signed)
In to round on pt. Her floor is littered with wrappers from food and food, juice containers. Her stretcher has food and crumbs in the sheet

## 2013-08-21 NOTE — ED Provider Notes (Signed)
Medical screening examination/treatment/procedure(s) were performed by non-physician practitioner and as supervising physician I was immediately available for consultation/collaboration.   EKG Interpretation None        Joya Gaskins, MD 08/21/13 1151

## 2013-08-21 NOTE — ED Notes (Signed)
No emesis to this point. Pt given gingerale and graham crackers as a snack.

## 2013-08-21 NOTE — ED Notes (Signed)
Pt. Given 3 packs of Graham crackers with peanut butter and a coke.

## 2013-08-21 NOTE — ED Notes (Signed)
Pt. Stated that her coke had watered down due to the ice and could she have some coffee. She requested more graham crackers and peanut butter.

## 2013-08-21 NOTE — Progress Notes (Addendum)
CSW spoke with pt in regards to disposition. Pt reported that she found out that she was pregnant in February and continued to use. Although, pt resides in [place] pt reported she came to [facility] because she will receive better care. Pt reported that she has one other child, 34 years old but has not seen her in 10 years. Pt is requesting detox and would like to do so at [facility]. Pt awaiting disposition.   [address] [place] [phone]

## 2013-08-21 NOTE — ED Notes (Signed)
CALLED PHARMACY TO CONFIRM THAT PT PSYCH MEDS ARE SAFE DURING PREGNANCY. THEY ADVISE THAT REMERON IS NOT RECCOMENDED DURING PREGNANCY. THEY ADVISE THAT LEXAPRO IS SAFE DURING THE 1ST AND 2ND TRIMESTER BUT NOT MUCH DATA FOR THE 3RD TRIMESTER. THEY ADVISE PROTONIX IS NOT SAFE AS A DAILY MED DURING PHARMACY BUT ADVISE ZANTAC IS A SAFE SUBSTITUTION.

## 2013-08-21 NOTE — ED Notes (Signed)
Pt. Demanding to have 2 dinner trays with dinner, states "I'm eating for two and I need it. I'm not like everyone else". Pt. Informed patient that she will be getting one dinner tray and that she would be allowed to have a [place] sandwich as needed. Pt. Already has [place] sandwich at bedside with multiple crackers. Pt. Continues to be agitated, demanding more food because "I'm pregnant. You don't understand. I need to have extra food for my baby".  Dr. Blinda Leatherwood at bedside to speak with patient. Per Dr. Blinda Leatherwood, pt. To have 1 tray with dinner. Pt. Voice raised and told this nurse to leave room.

## 2013-08-21 NOTE — ED Notes (Addendum)
Patient continues to eat oatmeal and drink liquids after being asked not to since she had emeisis. She is irritable and argumentative and does not wish to wait for her stomach to settle. She states at first it is morning sickness, then sayes it is withdrawal.

## 2013-08-21 NOTE — ED Notes (Signed)
Pt. States she spilled her coffee and could she have 2 cokes and another coffee. She requested more graham crackers and peanut butter.

## 2013-08-21 NOTE — ED Provider Notes (Signed)
Medical screening examination/treatment/procedure(s) were performed by non-physician practitioner and as supervising physician I was immediately available for consultation/collaboration.   EKG Interpretation None        Joya Gaskins, MD 08/21/13 2357

## 2013-08-22 DIAGNOSIS — F1910 Other psychoactive substance abuse, uncomplicated: Secondary | ICD-10-CM

## 2013-08-22 DIAGNOSIS — F1010 Alcohol abuse, uncomplicated: Secondary | ICD-10-CM

## 2013-08-22 MED ORDER — PROMETHAZINE HCL 25 MG PO TABS: 25.0000 mg | ORAL_TABLET | Freq: Four times a day (QID) | ORAL | Status: DC | PRN

## 2013-08-22 NOTE — ED Notes (Signed)
Nutritional services has delivered pt french fries. Tech has given pt 2 orange juices. She has come to desk asking for ativan and phenergan. She states "yall dont know what you are talking about i been goin thru withdrawal while i was in the shower" when social worker going in room pt states she needs 4 orange juices. No active vomiting

## 2013-08-22 NOTE — Consult Note (Signed)
[facility] Psychiatry Face-to-face Consultation   Reason for Consult:  Re-evaluation / Referral for psychiatric evaluation Referring Physician:  EDP Judy [name] is an 33 y.o. female.  Assessment: AXIS I:  Substance Abuse and Alcohol Use Disorder, Severe AXIS II:  Deferred AXIS III:   Past Medical History  Diagnosis Date  . Depression   . Cocaine abuse   . Polysubstance abuse   . Anxiety   . Depression   . Alcohol abuse    AXIS IV:  other psychosocial or environmental problems and problems related to social environment AXIS V:  61-70 mild symptoms  Plan:  Discharge home with outpatient referrals for psychiatry as well as prenatal care provider.   Subjective:   Judy [name] is a 33 y.o. female in [facility] seeking placement for cocaine detox. Pt has been waiting for greater than 48 hrs with no bed availability. Today, social worker attempted to find placement and all facilities were full. Pt was upset when this information was conveyed to her today by this NP. However, pt calmed down and affirmed understanding that we simply cannot hold patients in the ED long-term awaiting placement for detox. Pt was agreeable to follow-up outpatient for this as well as for her prenatal care which has been inconsistent.   HPI:  33 yo female patient who presented to [name] ED requesting detox from drugs and alcohol and c/o being depressed.  Patient states "I am 3 months pregnant and I've been using since I was 17." Patient states she wants detox from alcohol and drugs. States "its different this time 'cause of the baby." Patient reports that she has been using alcohol, cocaine and oxycontin.  She states she has been using alcohol "all day today. Whatever I could get my hands on; wine, liquor, beer, whatever." Last cocaine use was around 2pm.  Patient reports that she has had inpatient detox before "but most of the time I've left thinking I was ok or I could do it on my own. This time I'll  leave when the doctor says I can leave."  Patient states "this disease of addiction jumps on me and I use stuff."  Patient denies SI, HI and AVH.  Patient states that she has not had her Lexapro, Remeron or prenatal vitamin in 2 weeks.Patient states she has had one prenatal visit. Currently receiving care at the pregnancy center at [facility] in [place], [place]. Patient states she missed her appointment for her ultrasound, however, the patient did have an ultrasound in the ED which showed a single live intrauterine pregnancy with a measured gestational age of [redacted] weeks and 3 days.    HPI Elements:   Location:  Mood. Quality:  Alcohol and Substance Abuse. Severity:  Severe. Timing:  Daily. Duration:  Onset drug and alcohol use 18 years ago. Context:  Stressor.  Past Psychiatric History: Past Medical History  Diagnosis Date  . Depression   . Cocaine abuse   . Polysubstance abuse   . Anxiety   . Depression   . Alcohol abuse     reports that she has been smoking Cigarettes.  She has a 2.5 pack-year smoking history. She does not have any smokeless tobacco history on file. She reports that she drinks alcohol. She reports that she uses illicit drugs (Cocaine and "Crack" cocaine). No family history on file.       Allergies:   Allergies  Allergen Reactions  . Cephalosporins Hives  . Penicillins Hives  .  Sulfa Antibiotics Nausea Only  . Zofran [Ondansetron Hcl] Nausea And Vomiting    ACT Assessment Complete:  No:   Past Psychiatric History: Yes Diagnosis:  Alcohol Abuse, Polysubstance Abuse  Hospitalizations:  04/2013, 02/2013, & 01/2013 [facility]; [facility] 2014, [facility] 2002, [place] 2002, [facility]  Outpatient Care:  [facility], [place]  Substance Abuse Care:  Referred to [facility]  Self-Mutilation:  Denies  Suicidal Attempts:  Denies  Homicidal Behaviors:  Denies   Violent Behaviors:  No   Place of Residence:  [place] Marital Status:   Single Employed/Unemployed:  Unemployed Education:  [employer] Family Supports:  Boyfriend  Objective: Blood pressure 108/62, pulse 75, temperature 98.3 F (36.8 C), temperature source Oral, resp. rate 18, last menstrual period 05/20/2013, SpO2 98.00%.There is no weight on file to calculate BMI. Results for orders placed during the hospital encounter of 08/20/13 (from the past 72 hour(s))  ACETAMINOPHEN LEVEL     Status: None   Collection Time    08/20/13  8:45 PM      Result Value Ref Range   Acetaminophen (Tylenol), Serum <15.0  10 - 30 ug/mL   Comment:            THERAPEUTIC CONCENTRATIONS VARY     SIGNIFICANTLY. A RANGE OF 10-30     ug/mL MAY BE AN EFFECTIVE     CONCENTRATION FOR MANY PATIENTS.     HOWEVER, SOME ARE BEST TREATED     AT CONCENTRATIONS OUTSIDE THIS     RANGE.     ACETAMINOPHEN CONCENTRATIONS     >150 ug/mL AT 4 HOURS AFTER     INGESTION AND >50 ug/mL AT 12     HOURS AFTER INGESTION ARE     OFTEN ASSOCIATED WITH TOXIC     REACTIONS.  CBC     Status: Abnormal   Collection Time    08/20/13  8:45 PM      Result Value Ref Range   WBC 10.6 (*) 4.0 - 10.5 [name]/uL   RBC 3.68 (*) 3.87 - 5.11 MIL/uL   Hemoglobin 11.7 (*) 12.0 - 15.0 g/dL   HCT 32.7 (*) 36.0 - 46.0 %   MCV 88.9  78.0 - 100.0 fL   MCH 31.8  26.0 - 34.0 pg   MCHC 35.8  30.0 - 36.0 g/dL   RDW 14.3  11.5 - 15.5 %   Platelets 240  150 - 400 [name]/uL  COMPREHENSIVE METABOLIC PANEL     Status: Abnormal   Collection Time    08/20/13  8:45 PM      Result Value Ref Range   Sodium 141  137 - 147 mEq/L   Potassium 2.8 (*) 3.7 - 5.3 mEq/L   Comment: CRITICAL RESULT CALLED TO, READ BACK BY AND VERIFIED WITH:     Judy K,RN 08/20/13 2140 WAYK   Chloride 99  96 - 112 mEq/L   CO2 28  19 - 32 mEq/L   Glucose, Bld 50 (*) 70 - 99 mg/dL   BUN 6  6 - 23 mg/dL   Creatinine, Ser 0.83  0.50 - 1.10 mg/dL   Calcium 9.3  8.4 - 10.5 mg/dL   Total Protein 7.0  6.0 - 8.3 g/dL   Albumin 3.5  3.5 - 5.2 g/dL   AST 32  0 - 37  U/L   ALT 16  0 - 35 U/L   Alkaline Phosphatase 44  39 - 117 U/L   Total Bilirubin <0.2 (*) 0.3 - 1.2 mg/dL  GFR calc non Af Amer >90  >90 mL/min   GFR calc Af Amer >90  >90 mL/min   Comment: (NOTE)     The eGFR has been calculated using the CKD EPI equation.     This calculation has not been validated in all clinical situations.     eGFR's persistently <90 mL/min signify possible Chronic Kidney     Disease.  ETHANOL     Status: Abnormal   Collection Time    08/20/13  8:45 PM      Result Value Ref Range   Alcohol, Ethyl (B) 78 (*) 0 - 11 mg/dL   Comment:            LOWEST DETECTABLE LIMIT FOR     SERUM ALCOHOL IS 11 mg/dL     FOR MEDICAL PURPOSES ONLY  SALICYLATE LEVEL     Status: Abnormal   Collection Time    08/20/13  8:45 PM      Result Value Ref Range   Salicylate Lvl <7.6 (*) 2.8 - 20.0 mg/dL  HCG, QUANTITATIVE, PREGNANCY     Status: Abnormal   Collection Time    08/20/13  8:45 PM      Result Value Ref Range   hCG, Beta Chain, Quant, S 73419 (*) <5 mIU/mL   Comment:              GEST. AGE      CONC.  (mIU/mL)       <=1 WEEK        5 - 50         2 WEEKS       50 - 500         3 WEEKS       100 - 10,000         4 WEEKS     1,000 - 30,000         5 WEEKS     3,500 - 115,000       6-8 WEEKS     12,000 - 270,000        12 WEEKS     15,000 - 220,000                FEMALE AND NON-PREGNANT FEMALE:         LESS THAN 5 mIU/mL  LIPASE, BLOOD     Status: None   Collection Time    08/20/13  8:54 PM      Result Value Ref Range   Lipase 18  11 - 59 U/L  URINE RAPID DRUG SCREEN (HOSP PERFORMED)     Status: Abnormal   Collection Time    08/20/13  9:45 PM      Result Value Ref Range   Opiates NONE DETECTED  NONE DETECTED   Cocaine POSITIVE (*) NONE DETECTED   Benzodiazepines NONE DETECTED  NONE DETECTED   Amphetamines NONE DETECTED  NONE DETECTED   Tetrahydrocannabinol NONE DETECTED  NONE DETECTED   Barbiturates NONE DETECTED  NONE DETECTED   Comment:            DRUG  SCREEN FOR MEDICAL PURPOSES     ONLY.  IF CONFIRMATION IS NEEDED     FOR ANY PURPOSE, NOTIFY LAB     WITHIN 5 DAYS.                LOWEST DETECTABLE LIMITS     FOR URINE DRUG SCREEN     Drug Class  Cutoff (ng/mL)     Amphetamine      1000     Barbiturate      200     Benzodiazepine   263     Tricyclics       785     Opiates          300     Cocaine          300     THC              50  PREGNANCY, URINE     Status: Abnormal   Collection Time    08/20/13  9:45 PM      Result Value Ref Range   Preg Test, Ur POSITIVE (*) NEGATIVE   Comment:            THE SENSITIVITY OF THIS     METHODOLOGY IS >20 mIU/mL.  URINALYSIS, ROUTINE W REFLEX MICROSCOPIC     Status: Abnormal   Collection Time    08/20/13  9:45 PM      Result Value Ref Range   Color, Urine YELLOW  YELLOW   APPearance CLOUDY (*) CLEAR   Specific Gravity, Urine 1.017  1.005 - 1.030   pH 8.5 (*) 5.0 - 8.0   Glucose, UA NEGATIVE  NEGATIVE mg/dL   Hgb urine dipstick NEGATIVE  NEGATIVE   Bilirubin Urine NEGATIVE  NEGATIVE   Ketones, ur NEGATIVE  NEGATIVE mg/dL   Protein, ur 30 (*) NEGATIVE mg/dL   Urobilinogen, UA 1.0  0.0 - 1.0 mg/dL   Nitrite NEGATIVE  NEGATIVE   Leukocytes, UA SMALL (*) NEGATIVE  URINE MICROSCOPIC-ADD ON     Status: Abnormal   Collection Time    08/20/13  9:45 PM      Result Value Ref Range   Squamous Epithelial / LPF MANY (*) RARE   WBC, UA 7-10  <3 WBC/hpf   Bacteria, UA MANY (*) RARE  URINE CULTURE     Status: None   Collection Time    08/20/13  9:45 PM      Result Value Ref Range   Specimen Description URINE, CLEAN CATCH     Special Requests NONE     Culture  Setup Time       Value: 08/21/2013 12:53     Performed at [place] PENDING     Culture       Value: Culture reincubated for better growth     Performed at [facility]   Report Status PENDING    RPR     Status: None   Collection Time    08/20/13 11:08 PM      Result Value Ref Range   RPR  NON REAC  NON REAC   Comment: Performed at [facility]  HIV ANTIBODY (ROUTINE TESTING)     Status: None   Collection Time    08/20/13 11:08 PM      Result Value Ref Range   HIV 1&2 Ab, 4th Generation NONREACTIVE  NONREACTIVE   Comment: (NOTE)     A NONREACTIVE HIV Ag/Ab result does not exclude HIV infection since     the time frame for seroconversion is variable. If acute HIV infection     is suspected, a HIV-1 RNA Qualitative TMA test is recommended.     HIV-1/2 Antibody Diff         Not indicated.     HIV-1 RNA, Qual TMA  Not indicated.     PLEASE NOTE: This information has been disclosed to you from records     whose confidentiality may be protected by state law. If your state     requires such protection, then the state law prohibits you from making     any further disclosure of the information without the specific written     consent of the person to whom it pertains, or as otherwise permitted     by law. A general authorization for the release of medical or other     information is NOT sufficient for this purpose.     The performance of this assay has not been clinically validated in     patients less than 2 years old.     Performed at [facility], ED     Status: Abnormal   Collection Time    08/21/13  4:20 AM      Result Value Ref Range   Sodium 136 (*) 137 - 147 mEq/L   Potassium 3.3 (*) 3.7 - 5.3 mEq/L   Chloride 104  96 - 112 mEq/L   BUN <3 (*) 6 - 23 mg/dL   Creatinine, Ser 0.70  0.50 - 1.10 mg/dL   Glucose, Bld 101 (*) 70 - 99 mg/dL   Calcium, Ion 1.03 (*) 1.12 - 1.23 mmol/L   TCO2 24  0 - 100 mmol/L   Hemoglobin 9.9 (*) 12.0 - 15.0 g/dL   HCT 29.0 (*) 36.0 - 46.0 %   Labs are reviewed and are pertinent for + cocaine  Current Facility-Administered Medications  Medication Dose Route Frequency Provider Last Rate Last Dose  . acetaminophen (TYLENOL) tablet 650 mg  650 mg Oral Q4H PRN Nicole Pisciotta, PA-C   650 mg at  08/22/13 3244  . alum & mag hydroxide-simeth (MAALOX/MYLANTA) 200-200-20 MG/5ML suspension 30 mL  30 mL Oral PRN Monico Blitz, PA-C      . LORazepam (ATIVAN) tablet 0-4 mg  0-4 mg Oral 4 times per day Monico Blitz, PA-C   1 mg at 08/21/13 1735  . nitrofurantoin (macrocrystal-monohydrate) (MACROBID) capsule 100 mg  100 mg Oral Once [employer], PA-C      . nitrofurantoin (macrocrystal-monohydrate) (MACROBID) capsule 100 mg  100 mg Oral Q12H Merryl Hacker, MD   100 mg at 08/22/13 0950  . potassium chloride SA ([name]-DUR,KLOR-CON) CR tablet 20 mEq  20 mEq Oral BID Merryl Hacker, MD   20 mEq at 08/21/13 1732  . prenatal multivitamin tablet 1 tablet  1 tablet Oral Q1200 Nicole Pisciotta, PA-C   1 tablet at 08/21/13 1204  . promethazine (PHENERGAN) tablet 25 mg  25 mg Oral Q6H PRN Merryl Hacker, MD   25 mg at 08/22/13 0950  . ranitidine (ZANTAC) tablet 300 mg  300 mg Oral Daily Merryl Hacker, MD   300 mg at 08/22/13 0950  . thiamine (VITAMIN B-1) tablet 100 mg  100 mg Oral Daily Nicole Pisciotta, PA-C   100 mg at 08/22/13 0102   Current Outpatient Prescriptions  Medication Sig Dispense Refill  . ciprofloxacin (CIPRO) 500 MG tablet Take 1 tablet (500 mg total) by mouth 2 (two) times daily. For UTI      . escitalopram (LEXAPRO) 20 MG tablet Take 1 tablet (20 mg total) by mouth daily. For depression  30 tablet  0  . mirtazapine (REMERON) 30 MG tablet Take 1 tablet (30 mg total) by mouth at bedtime. For depression/sleep  30 tablet  0  . pantoprazole (PROTONIX) 40 MG tablet Take 1 tablet (40 mg total) by mouth daily. For acid reflux      . promethazine (PHENERGAN) 25 MG tablet Take 1 tablet (25 mg total) by mouth every 6 (six) hours as needed for nausea or vomiting.  15 tablet  0  . zolpidem (AMBIEN) 10 MG tablet Take 1 tablet (10 mg total) by mouth at bedtime as needed for sleep.  5 tablet  0    Psychiatric Specialty Exam:     Blood pressure 108/62, pulse 75, temperature 98.3  F (36.8 C), temperature source Oral, resp. rate 18, last menstrual period 05/20/2013, SpO2 98.00%.There is no weight on file to calculate BMI.  General Appearance: Fairly Groomed  Engineer, water::  Fair  Speech:  Clear and Coherent  Volume:  Normal  Mood:  Anxious  Affect:  Congruent  Thought Process:  Coherent  Orientation:  Full (Time, Place, and Person)  Thought Content:  WDL  Suicidal Thoughts:  No  Homicidal Thoughts:  No  Memory:  Immediate;   Fair Recent;   Fair Remote;   Fair  Judgement:  Impaired  Insight:  Fair  Psychomotor Activity:  Normal  Concentration:  Fair  Recall:  Fair  Akathisia:  No  Handed:  Right  AIMS (if indicated):     Assets:  Desire for Improvement  Sleep:      Treatment Plan Summary: -Send home with outpatient referrals for detox/addiction as well as prenatal care.   Disposition: No beds available during the past 48hrs of inpatient stay nor currently. Pt to be discharged home per Dr. Dwyane Dee with outpatient referrals.  *Case reviewed with Dr. Ledora Bottcher, FNP-BC 08/22/2013 4:05 PM

## 2013-08-22 NOTE — ED Notes (Signed)
Conrad np at bedside evaluating pt

## 2013-08-22 NOTE — ED Provider Notes (Signed)
On morning rounds, at 8 AM the patient is awake, alert, and in no distress, speaking clearly. She complains of a toothache, but no fevers, chills. She is eating.  I discussed the patient's case with our psychiatry mid-level provider who will assist with disposition.  Gerhard Munch, MD 08/22/13 1159

## 2013-08-22 NOTE — BH Assessment (Signed)
Patient assessed by Claudette Head, NP who discussed this case with MD Lucianne Muss. Recommending discharge home with outpatient resources. Call TTS with questions.

## 2013-08-22 NOTE — ED Notes (Signed)
Patient has been to desk 4 times complaining that she did not get everything she asked for for lunch. The kitchen has been called and fries ordered for pt.

## 2013-08-22 NOTE — ED Notes (Signed)
PT UP TO DESK AGAIN TO CALL BABY DADDY TO HAVE HIM BRING HER ID AND OTHER THINGS SHE WILL NEED TO CHECK INTO THE SHELTER.

## 2013-08-22 NOTE — ED Notes (Signed)
Pt. Requesting more coffee and graham crackers with peanut butter. She was given 4 packs with 4 peanut butters. Why standing in room, Pt. Notice she had put her creamer and sugar in her soda and demanded another coffee. Pt. Given new coffee.

## 2013-08-22 NOTE — Discharge Instructions (Signed)

## 2013-08-23 LAB — URINE CULTURE

## 2013-08-24 NOTE — Progress Notes (Signed)
CSW spoke with pt in regards to a program called [facility]. [facility] is a ministry that provides transitional housing to women in recovery from substance abuse. CSW explained to pt that this a 15 month program located in [place], [place]. Pt interested. Application completed and faxed. Pt will be discharged and will follow up with [facility] regarding application status.   [address], [place] [phone]

## 2013-08-26 NOTE — Consult Note (Signed)
Case discussed, patient can be discharged home. Information about long-term rehabilitation given to patient upon discharge

## 2013-11-16 ENCOUNTER — Encounter (HOSPITAL_COMMUNITY): Payer: Self-pay | Admitting: Emergency Medicine

## 2013-11-16 ENCOUNTER — Emergency Department (HOSPITAL_COMMUNITY): Payer: MEDICAID

## 2013-11-16 ENCOUNTER — Emergency Department (HOSPITAL_COMMUNITY)
Admission: EM | Admit: 2013-11-16 | Discharge: 2013-11-17 | Disposition: A | Discharge status: Home / Self Care | Payer: MEDICAID | Attending: Emergency Medicine | Admitting: Emergency Medicine

## 2013-11-16 DIAGNOSIS — G4700 Insomnia, unspecified: Secondary | ICD-10-CM | POA: Insufficient documentation

## 2013-11-16 DIAGNOSIS — Z792 Long term (current) use of antibiotics: Secondary | ICD-10-CM | POA: Diagnosis not present

## 2013-11-16 DIAGNOSIS — O211 Hyperemesis gravidarum with metabolic disturbance: Secondary | ICD-10-CM | POA: Insufficient documentation

## 2013-11-16 DIAGNOSIS — Z880 Allergy status to penicillin: Secondary | ICD-10-CM | POA: Diagnosis not present

## 2013-11-16 DIAGNOSIS — F3289 Other specified depressive episodes: Secondary | ICD-10-CM | POA: Diagnosis not present

## 2013-11-16 DIAGNOSIS — O9989 Other specified diseases and conditions complicating pregnancy, childbirth and the puerperium: Secondary | ICD-10-CM | POA: Diagnosis present

## 2013-11-16 DIAGNOSIS — O99330 Smoking (tobacco) complicating pregnancy, unspecified trimester: Secondary | ICD-10-CM | POA: Insufficient documentation

## 2013-11-16 DIAGNOSIS — R1084 Generalized abdominal pain: Secondary | ICD-10-CM | POA: Diagnosis not present

## 2013-11-16 DIAGNOSIS — Z79899 Other long term (current) drug therapy: Secondary | ICD-10-CM | POA: Diagnosis not present

## 2013-11-16 DIAGNOSIS — F1410 Cocaine abuse, uncomplicated: Secondary | ICD-10-CM | POA: Diagnosis not present

## 2013-11-16 DIAGNOSIS — F1010 Alcohol abuse, uncomplicated: Secondary | ICD-10-CM | POA: Diagnosis present

## 2013-11-16 DIAGNOSIS — Z3493 Encounter for supervision of normal pregnancy, unspecified, third trimester: Secondary | ICD-10-CM

## 2013-11-16 DIAGNOSIS — F411 Generalized anxiety disorder: Secondary | ICD-10-CM | POA: Insufficient documentation

## 2013-11-16 DIAGNOSIS — O99340 Other mental disorders complicating pregnancy, unspecified trimester: Secondary | ICD-10-CM | POA: Insufficient documentation

## 2013-11-16 DIAGNOSIS — F329 Major depressive disorder, single episode, unspecified: Secondary | ICD-10-CM | POA: Insufficient documentation

## 2013-11-16 LAB — CBC WITH DIFFERENTIAL/PLATELET
Basophils Absolute: 0 10*3/uL (ref 0.0–0.1)
Basophils Relative: 0 % (ref 0–1)
EOS ABS: 0.2 10*3/uL (ref 0.0–0.7)
EOS PCT: 2 % (ref 0–5)
HCT: 29.6 % — ABNORMAL LOW (ref 36.0–46.0)
Hemoglobin: 10.3 g/dL — ABNORMAL LOW (ref 12.0–15.0)
LYMPHS PCT: 37 % (ref 12–46)
Lymphs Abs: 2.7 10*3/uL (ref 0.7–4.0)
MCH: 30.1 pg (ref 26.0–34.0)
MCHC: 34.8 g/dL (ref 30.0–36.0)
MCV: 86.5 fL (ref 78.0–100.0)
Monocytes Absolute: 0.9 10*3/uL (ref 0.1–1.0)
Monocytes Relative: 12 % (ref 3–12)
NEUTROS ABS: 3.6 10*3/uL (ref 1.7–7.7)
Neutrophils Relative %: 49 % (ref 43–77)
Platelets: 278 10*3/uL (ref 150–400)
RBC: 3.42 MIL/uL — ABNORMAL LOW (ref 3.87–5.11)
RDW: 14 % (ref 11.5–15.5)
WBC: 7.3 10*3/uL (ref 4.0–10.5)

## 2013-11-16 LAB — BASIC METABOLIC PANEL
ANION GAP: 13 (ref 5–15)
Anion gap: 12 (ref 5–15)
BUN: 6 mg/dL (ref 6–23)
BUN: 6 mg/dL (ref 6–23)
CALCIUM: 8.8 mg/dL (ref 8.4–10.5)
CO2: 30 mEq/L (ref 19–32)
CO2: 30 mEq/L (ref 19–32)
Calcium: 8.6 mg/dL (ref 8.4–10.5)
Chloride: 97 mEq/L (ref 96–112)
Chloride: 96 mEq/L (ref 96–112)
Creatinine, Ser: 0.77 mg/dL (ref 0.50–1.10)
Creatinine, Ser: 0.74 mg/dL (ref 0.50–1.10)
GFR calc Af Amer: >90 mL/min (ref >90)
GFR calc non Af Amer: >90 mL/min (ref >90)
GLUCOSE: 99 mg/dL (ref 70–99)
Glucose, Bld: 99 mg/dL (ref 70–99)
POTASSIUM: 2.8 meq/L — AB (ref 3.7–5.3)
Potassium: 3.4 mEq/L — ABNORMAL LOW (ref 3.7–5.3)
SODIUM: 140 meq/L (ref 137–147)
Sodium: 138 mEq/L (ref 137–147)

## 2013-11-16 MED ORDER — DIPHENHYDRAMINE HCL 25 MG PO CAPS
25.0000 mg | ORAL_CAPSULE | Freq: Every evening | ORAL | Status: DC | PRN
  Filled 2013-11-16: qty 1

## 2013-11-16 MED ORDER — PROMETHAZINE HCL 25 MG/ML IJ SOLN
25.0000 mg | Freq: Four times a day (QID) | INTRAMUSCULAR | Status: DC | PRN
  Filled 2013-11-16: qty 1

## 2013-11-16 MED ORDER — SODIUM CHLORIDE 0.9 % IV SOLN
INTRAVENOUS | Status: DC
Start: 2013-11-16 — End: 2013-11-17

## 2013-11-16 MED ORDER — POTASSIUM CHLORIDE CRYS ER 20 MEQ PO TBCR
40.0000 meq | EXTENDED_RELEASE_TABLET | Freq: Once | ORAL | Status: AC
  Administered 2013-11-16: 40 meq via ORAL
  Filled 2013-11-16: qty 2

## 2013-11-16 MED ORDER — DOXYLAMINE SUCCINATE (SLEEP) 25 MG PO TABS: 25.0000 mg | ORAL_TABLET | Freq: Every evening | ORAL | Status: DC | PRN

## 2013-11-16 MED ORDER — SODIUM CHLORIDE 0.9 % IV BOLUS (SEPSIS)
500.0000 mL | Freq: Once | INTRAVENOUS | Status: AC
  Administered 2013-11-16: 500 mL via INTRAVENOUS

## 2013-11-16 NOTE — ED Provider Notes (Signed)
CSN: [id number]     Arrival date & time 11/16/13  2014 History   First MD Initiated Contact with Patient 11/16/13 2022     No chief complaint on file.    (Consider location/radiation/quality/duration/timing/severity/associated sxs/prior Treatment) HPI .Judy [name] 33 y.o. who reports being [redacted] weeks pregnant presents reportedly for alcohol and crack cocaine rehab, and also reports abdominal pain. She initially reported that she was having contractions and the nursing staff called rapid for the OB nurse to present. She reports smoking $1000 dollars of crack today, and "I did not prostitute for it". Also was drinking alcohol today. She reports having withdraw now, but denies every having seizures or DTs. Her abdominal pain is described as generalized abdominal pain and worsened with vomiting. No known relieving factors. Associated with N/V. Emesis is NBNB. Denies any dysuria or diarrhea. No blood in stool. She reports that she is seeking inpatient rehab in [place]. Denies chest pain, shortness of breath, headache, dizziness.   Past Medical History  Diagnosis Date  . Depression   . Cocaine abuse   . Polysubstance abuse   . Anxiety   . Depression   . Alcohol abuse    No past surgical history on file. No family history on file. History  Substance Use Topics  . Smoking status: Current Every Day Smoker -- 0.25 packs/day for 10 years    Types: Cigarettes  . Smokeless tobacco: Not on file  . Alcohol Use: Yes     Comment: drinks vodka daily. Unable to articulate amount.   OB History   Grav Para Term Preterm Abortions TAB SAB Ect Mult Living                 Review of Systems  All other systems reviewed and are negative.     Allergies  Cephalosporins; Penicillins; Sulfa antibiotics; and Zofran  Home Medications   Prior to Admission medications   Medication Sig Start Date End Date Taking? Authorizing Provider  ciprofloxacin (CIPRO) 500 MG tablet Take 1 tablet (500 mg  total) by mouth 2 (two) times daily. For UTI 05/17/13   Sanjuana Kava, NP  escitalopram (LEXAPRO) 20 MG tablet Take 1 tablet (20 mg total) by mouth daily. For depression 05/17/13   Sanjuana Kava, NP  mirtazapine (REMERON) 30 MG tablet Take 1 tablet (30 mg total) by mouth at bedtime. For depression/sleep 05/17/13   Sanjuana Kava, NP  pantoprazole (PROTONIX) 40 MG tablet Take 1 tablet (40 mg total) by mouth daily. For acid reflux 05/17/13   Sanjuana Kava, NP  promethazine (PHENERGAN) 25 MG tablet Take 1 tablet (25 mg total) by mouth every 6 (six) hours as needed for nausea or vomiting. 08/22/13   Shari A Upstill, PA-C  zolpidem (AMBIEN) 10 MG tablet Take 1 tablet (10 mg total) by mouth at bedtime as needed for sleep. 05/17/13 06/16/13  Sanjuana Kava, NP   There were no vitals taken for this visit. Physical Exam  Constitutional: She is oriented to person, place, and time. She appears well-developed and well-nourished. She appears distressed (nauseated and vomiting on exam).  HENT:  Head: Normocephalic and atraumatic.  Right Ear: External ear normal.  Left Ear: External ear normal.  Eyes: Conjunctivae and EOM are normal. Right eye exhibits no discharge. Left eye exhibits no discharge.  Neck: Normal range of motion. Neck supple. No JVD present.  Cardiovascular: Normal rate, regular rhythm and normal heart sounds.  Exam reveals no gallop and no friction rub.  No murmur heard. Pulmonary/Chest: Effort normal and breath sounds normal. No stridor. No respiratory distress. She has no wheezes. She has no rales. She exhibits no tenderness.  Abdominal: Soft. Bowel sounds are normal. She exhibits no distension, no pulsatile liver and no abdominal bruit. There is no tenderness. There is no rigidity, no rebound, no guarding, no tenderness at McBurney's point and negative Murphy's sign.  Gravid with uterus 8 cm above the umbilicus  Musculoskeletal: Normal range of motion. She exhibits no edema.  Neurological: She is  alert and oriented to person, place, and time.  Skin: Skin is warm. No rash noted. She is not diaphoretic.  Psychiatric: She has a normal mood and affect. Her behavior is normal.    ED Course  Procedures (including critical care time) Labs Review Labs Reviewed - No data to display  Imaging Review No results found.   EKG Interpretation None      MDM   Final diagnoses:  None    AFVSS. No peritonitis on exam. OB nurse quickly hook her up on toco, with reassuring strip and FHR in 140's. Patient refused vaginal exam as discussed with her that is it important to visualize the cervix but she refused. No leukocytosis. K 2.8, which she was given 40 mEq K PO. Plan for UA as well. Will obtain US as well for fetal age and cervical status as she is refusing pelvic exam. She requested Ativan by name, but this is Class D. She was given Benadryl as she has an allergy to Zofran. She was immediately asking for ice chip and orange juice which she tolerated appropriately. She then asked for a sandwich as "I have not eaten in 3 days". She tolerated food appropriately. NO evidence of withdraw here, as HDS, VSS and wnls.  [place] - IUP with EGA [redacted]w[redacted]d, appropriate FHR, and closed cervix. No evidence of active labor. Likely symptoms related to combination of wd and pregnancy. Low amniotic volume will need fu with OB on outpatient basis. Patient's symptoms have resolved. She continues to seek rehab and treatment for EtOH abuse and crack cocaine abuse. She is cleared medically. Will plan or her to be evaluated for possible placement in the morning. Labs and imaging reviewed. Care discussed with my attending, Dr. Fayrene Fearing.    Sena Hitch, MD 11/17/13 [phone]

## 2013-11-16 NOTE — Discharge Instructions (Signed)
Abdominal Pain During Pregnancy Abdominal pain is common in pregnancy. Most of the time, it does not cause harm. There are many causes of abdominal pain. Some causes are more serious than others. Some of the causes of abdominal pain in pregnancy are easily diagnosed. Occasionally, the diagnosis takes time to understand. Other times, the cause is not determined. Abdominal pain can be a sign that something is very wrong with the pregnancy, or the pain may have nothing to do with the pregnancy at all. For this reason, always tell your health care provider if you have any abdominal discomfort. HOME CARE INSTRUCTIONS  Monitor your abdominal pain for any changes. The following actions may help to alleviate any discomfort you are experiencing:  Do not have sexual intercourse or put anything in your vagina until your symptoms go away completely.  Get plenty of rest until your pain improves.  Drink clear fluids if you feel nauseous. Avoid solid food as long as you are uncomfortable or nauseous.  Only take over-the-counter or prescription medicine as directed by your health care provider.  Keep all follow-up appointments with your health care provider. SEEK IMMEDIATE MEDICAL CARE IF:  You are bleeding, leaking fluid, or passing tissue from the vagina.  You have increasing pain or cramping.  You have persistent vomiting.  You have painful or bloody urination.  You have a fever.  You notice a decrease in your baby's movements.  You have extreme weakness or feel faint.  You have shortness of breath, with or without abdominal pain.  You develop a severe headache with abdominal pain.  You have abnormal vaginal discharge with abdominal pain.  You have persistent diarrhea.  You have abdominal pain that continues even after rest, or gets worse. MAKE SURE YOU:   Understand these instructions.  Will watch your condition.  Will get help right away if you are not doing well or get  worse. Document Released: 03/09/2005 Document Revised: 12/28/2012 Document Reviewed: 10/06/2012 [facility] Patient Information 2015 [place], [place]. This information is not intended to replace advice given to you by your health care provider. Make sure you discuss any questions you have with your health care provider.  Alcohol and Nutrition Nutrition serves two purposes. It provides energy. It also maintains body structure and function. Food supplies energy. It also provides the building blocks needed to replace worn or damaged cells. Alcoholics often eat poorly. This limits their supply of essential nutrients. This affects energy supply and structure maintenance. Alcohol also affects the body's nutrients in:  Digestion.  Storage.  Using and getting rid of waste products. IMPAIRMENT OF NUTRIENT DIGESTION AND UTILIZATION   Once ingested, food must be broken down into small components (digested). Then it is available for energy. It helps maintain body structure and function. Digestion begins in the mouth. It continues in the stomach and intestines, with help from the pancreas. The nutrients from digested food are absorbed from the intestines into the blood. Then they are carried to the liver. The liver prepares nutrients for:  Immediate use.  Storage and future use.  Alcohol inhibits the breakdown of nutrients into usable molecules.  It decreases secretion of digestive enzymes from the pancreas.  Alcohol impairs nutrient absorption by damaging the cells lining the stomach and intestines.  It also interferes with moving some nutrients into the blood.  In addition, nutritional deficiencies themselves may lead to further absorption problems.  For example, folate deficiency changes the cells that line the small intestine. This impairs how water is  absorbed. It also affects absorbed nutrients. These include glucose, sodium, and additional folate.  Even if nutrients are digested and absorbed,  alcohol can prevent them from being fully used. It changes their transport, storage, and excretion. Impaired utilization of nutrients by alcoholics is indicated by:  Decreased liver stores of vitamins, such as vitamin A.  Increased excretion of nutrients such as fat. ALCOHOL AND ENERGY SUPPLY   Three basic nutritional components found in food are:  Carbohydrates.  Proteins.  Fats.  These are used as energy. Some alcoholics take in as much as 50% of their total daily calories from alcohol. They often neglect important foods.  Even when enough food is eaten, alcohol can impair the ways the body controls blood sugar (glucose) levels. It may either increase or decrease blood sugar.  In non-diabetic alcoholics, increased blood sugar (hyperglycemia) is caused by poor insulin secretion. It is usually temporary.  Decreased blood sugar (hypoglycemia) can cause serious injury even if this condition is short-lived. Low blood sugar can happen when a fasting or malnourished person drinks alcohol. When there is no food to supply energy, stored sugar is used up. The products of alcohol inhibit forming glucose from other compounds such as amino acids. As a result, alcohol causes the brain and other body tissue to lack glucose. It is needed for energy and function.  Alcohol is an energy source. But how the body processes and uses the energy from alcohol is complex. Also, when alcohol is substituted for carbohydrates, subjects tend to lose weight. This indicates that they get less energy from alcohol than from food. ALCOHOL - MAINTAINING CELL STRUCTURE AND FUNCTION  Structure Cells are made mostly of protein. So an adequate protein diet is important for maintaining cell structure. This is especially true if cells are being damaged. Research indicates that alcohol affects protein nutrition by causing impaired:  Digestion of proteins to amino acids.  Processing of amino acids by the small intestine and  liver.  Synthesis of proteins from amino acids.  Protein secretion by the liver. Function Nutrients are essential for the body to function well. They provide the tools that the body needs to work well:   Proteins.  Vitamins.  Minerals. Alcohol can disrupt body function. It may cause nutrient deficiencies. And it may interfere with the way nutrients are processed. Vitamins  Vitamins are essential to maintain growth and normal metabolism. They regulate many of the body`s processes. Chronic heavy drinking causes deficiencies in many vitamins. This is caused by eating less. And, in some cases, vitamins may be poorly absorbed. For example, alcohol inhibits fat absorption. It impairs how the vitamins A, E, and D are normally absorbed along with dietary fats. Not enough vitamin A may cause night blindness. Not enough vitamin D may cause softening of the bones.  Some alcoholics lack vitamins A, C, D, E, K, and the B vitamins. These are all involved in wound healing and cell maintenance. In particular, because vitamin K is necessary for blood clotting, lacking that vitamin can cause delayed clotting. The result is excess bleeding. Lacking other vitamins involved in brain function may cause severe neurological damage. Minerals Deficiencies of minerals such as calcium, magnesium, iron, and zinc are common in alcoholics. The alcohol itself does not seem to affect how these minerals are absorbed. Rather, they seem to occur secondary to other alcohol-related problems, such as:  Less calcium absorbed.  Not enough magnesium.  More urinary excretion.  Vomiting.  Diarrhea.  Not enough iron due to  gastrointestinal bleeding.  Not enough zinc or losses related to other nutrient deficiencies.  Mineral deficiencies can cause a variety of medical consequences. These range from calcium-related bone disease to zinc-related night blindness and skin lesions. ALCOHOL, MALNUTRITION, AND MEDICAL COMPLICATIONS   Liver Disease   Alcoholic liver damage is caused primarily by alcohol itself. But poor nutrition may increase the risk of alcohol-related liver damage. For example, nutrients normally found in the liver are known to be affected by drinking alcohol. These include carotenoids, which are the major sources of vitamin A, and vitamin E compounds. Decreases in such nutrients may play some role in alcohol-related liver damage. Pancreatitis  Research suggests that malnutrition may increase the risk of developing alcoholic pancreatitis. Research suggests that a diet lacking in protein may increase alcohol's damaging effect on the pancreas. Brain  Nutritional deficiencies may have severe effects on brain function. These may be permanent. Specifically, thiamine deficiencies are often seen in alcoholics. They can cause severe neurological problems. These include:  Impaired movement.  Memory loss seen in Wernicke-Korsakoff syndrome. Pregnancy  Alcohol has toxic effects on fetal development. It causes alcohol-related birth defects. They include fetal alcohol syndrome. Alcohol itself is toxic to the fetus. Also, the nutritional deficiency can affect how the fetus develops. That may compound the risk of developmental damage.  Nutritional needs during pregnancy are 10% to 30% greater than normal. Food intake can increase by as much as 140% to cover the needs of both mother and fetus. An alcoholic mother`s nutritional problems may adversely affect the nutrition of the fetus. And alcohol itself can also restrict nutrition flow to the fetus. NUTRITIONAL STATUS OF ALCOHOLICS  Techniques for assessing nutritional status include:  Taking body measurements to estimate fat reserves. They include:  Weight.  Height.  Mass.  Skin fold thickness.  Performing blood analysis to provide measurements of circulating:  Proteins.  Vitamins.  Minerals.  These techniques tend to be imprecise. For many nutrients,  there is no clear "cut-off" point that would allow an accurate definition of deficiency. So assessing the nutritional status of alcoholics is limited by these techniques. Dietary status may provide information about the risk of developing nutritional problems. Dietary status is assessed by:  Taking patients' dietary histories.  Evaluating the amount and types of food they are eating.  It is difficult to determine what exact amount of alcohol begins to have damaging effects on nutrition. In general, moderate drinkers have 2 drinks or less per day. They seem to be at little risk for nutritional problems. Various medical disorders begin to appear at greater levels.  Research indicates that the majority of even the heaviest drinkers have few obvious nutritional deficiencies. Many alcoholics who are hospitalized for medical complications of their disease do have severe malnutrition. Alcoholics tend to eat poorly. Often they eat less than the amounts of food necessary to provide enough:  Carbohydrates.  Protein.  Fat.  Vitamins A and C.  B vitamins.  Minerals like calcium and iron. Of major concern is alcohol's effect on digesting food and use of nutrients. It may shift a mildly malnourished person toward severe malnutrition. Document Released: 01/01/2005 Document Revised: 06/01/2011 Document Reviewed: 06/17/2005 [facility] Patient Information 2015 [place], [place]. This information is not intended to replace advice given to you by your health care provider. Make sure you discuss any questions you have with your health care provider.

## 2013-11-16 NOTE — ED Notes (Addendum)
Pt reports using $1000 worth cocaine use this AM at 0500, and alcohol use 30 minutes ago. Pt states that she was just released from detox. Pt is [redacted] weeks pregnant. Pt received 4 mg of zofran en route. OB rapid response to see the pt.

## 2013-11-16 NOTE — ED Notes (Signed)
Pt has vomited twice since arriving in the ED. However, pt is asking for ice chips and juice.

## 2013-11-16 NOTE — ED Notes (Signed)
Pt asking for more juice. No emesis since first consumption of juice and ice chips.

## 2013-11-16 NOTE — ED Notes (Signed)
Pt asking for a sandwich. Nurse tech gave sandwich to pt. No more episodes of emesis have occurred.

## 2013-11-16 NOTE — Progress Notes (Signed)
Call from ED with pt presenting for detox who is pregnant. On review pt had an [place] on 08/20/13 that gave her an EDC of 02/24/14 which is [redacted]w[redacted]d. Call to [facility] Attending Dr Macon Large. Obtain an NST and if reassuring pt is cleared from OB. [facility] does not do detox.

## 2013-11-16 NOTE — ED Notes (Signed)
This RN updated OB rapid response RN, who states that she is on the way.

## 2013-11-16 NOTE — ED Notes (Signed)
Pt given 2 cartons of OJ and a cup of ice.

## 2013-11-16 NOTE — ED Notes (Signed)
Pt refused benedryl, stating that she cannot take pills at this time.

## 2013-11-16 NOTE — ED Notes (Signed)
[facility]- [id number].

## 2013-11-16 NOTE — ED Notes (Signed)
Pt refusing Phenergan, stating that it makes her sleepy. Pt states she would like benedryl instead. Also, per Fayrene Fearing, MD the pt's blood samples have clotted and need to be redrawn.

## 2013-11-17 DIAGNOSIS — F1410 Cocaine abuse, uncomplicated: Secondary | ICD-10-CM

## 2013-11-17 DIAGNOSIS — F1010 Alcohol abuse, uncomplicated: Secondary | ICD-10-CM

## 2013-11-17 DIAGNOSIS — F332 Major depressive disorder, recurrent severe without psychotic features: Secondary | ICD-10-CM

## 2013-11-17 LAB — URINALYSIS, ROUTINE W REFLEX MICROSCOPIC
Bilirubin Urine: NEGATIVE
Glucose, UA: NEGATIVE mg/dL
Hgb urine dipstick: NEGATIVE
Ketones, ur: NEGATIVE mg/dL
Nitrite: NEGATIVE
Protein, ur: 100 mg/dL — AB
Specific Gravity, Urine: 1.021 (ref 1.005–1.030)
UROBILINOGEN UA: 1 mg/dL (ref 0.0–1.0)
pH: 8.5 — ABNORMAL HIGH (ref 5.0–8.0)

## 2013-11-17 LAB — RAPID URINE DRUG SCREEN, HOSP PERFORMED
Amphetamines: NOT DETECTED
Barbiturates: NOT DETECTED
Benzodiazepines: NOT DETECTED
COCAINE: POSITIVE — AB
Opiates: NOT DETECTED
TETRAHYDROCANNABINOL: NOT DETECTED

## 2013-11-17 LAB — URINE MICROSCOPIC-ADD ON

## 2013-11-17 LAB — ETHANOL: Alcohol, Ethyl (B): <11 mg/dL (ref 0–11)

## 2013-11-17 MED ORDER — PROMETHAZINE HCL 12.5 MG PO TABS
12.5000 mg | ORAL_TABLET | Freq: Four times a day (QID) | ORAL | Status: DC | PRN
  Filled 2013-11-17: qty 1

## 2013-11-17 NOTE — ED Notes (Signed)
Pt. Speaking to [facility] from [facility] for her telepsych.

## 2013-11-17 NOTE — ED Provider Notes (Signed)
Pt presents with 26wk pregnancy.  ETOH and cocaine use. Eval here shows normal monitor, normal NST. Normal [place]. Nausea improved, and patient eating and drinking without difficulty.   Pt medically clear.   D/W AC at [facility] re pt gravid status, and request for detox.  They will eval her and give consideration for inpatient criteria.  However, only 1 female bed available until at least am.  Will eval in am.  Rolland Porter, MD 11/17/13 [phone]

## 2013-11-17 NOTE — Consult Note (Signed)
[facility] Face-to-Face Psychiatry Consult   Reason for Consult:  Cocaine intoxication, alcohol abuse and [redacted] weeks gestation Referring Physician:  Dr. Vella Kohler Judy [name] is an 33 y.o. female. Total Time spent with patient: 45 minutes  Assessment: AXIS I:  Alcohol use disorder, Severe, Cocaine use disorder, Severe, Major depressive disorder, Recurrent episode, Moderate AXIS II:  Deferred AXIS III:   Past Medical History  Diagnosis Date  . Depression   . Cocaine abuse   . Polysubstance abuse   . Anxiety   . Depression   . Alcohol abuse    AXIS IV:  other psychosocial or environmental problems, problems related to social environment and problems with primary support group AXIS V:  51-60 moderate symptoms  Plan:  Case discussed with Dr. Vanita Panda, staff RN and case management Referred to CPS regarding substance abuse during pregnancy No evidence of imminent risk to self or others at present.   Patient does not meet criteria for psychiatric inpatient admission. Supportive therapy provided about ongoing stressors. Refer to IOP. Discussed crisis plan, support from social network, calling 911, coming to the Emergency Department, and calling Suicide Hotline. Appreciate psychiatric consultation Please contact [phone] if needs further assistance  Subjective:   Judy [name] is a 33 y.o. female patient admitted with substance abuse.  HPI:  Judy [name] is an 33 y.o. female seen for psychiatric consultation in the [facility] ER. Reviewed Greer assessment. She presents to ED with me relapsed on alcohol and cocaine binge one week. Patient has currently mild symptoms of alcohol withdrawal and cocaine withdrawal. She had been sober for 42 days during recent hospitalization for detox treatment and rehabilitation. Patient reported she was about 7 days sober before he lapsed. Patient was previously admitted at [facility] for detox treatment and then went to [facility] for detox and [facility] for substance abuse rehabilitation treatment. Patient has not a reliable historian initially she stated she spent $1000 on drugs and further inquiry reported $300. As per the report patient is drinking "all the alcohol I can get my hands on" (reports drinking liquor, beer, margaritas), and using $300 cocaine daily. Patient has had inpatient treatment for detox and rehab in the past multiple times. Patient has craving for food and drink and requesting/demanding more breakfast this morning. Patient patient requested medication for depression which she was noncompliant with outpatient medication management. Patient has a lot of familiarity with a rehabilitation treatments and well known that some of the programs does not accept due to gestation and other programs she does not want to go due to previous experiences.     HPI Elements:   Location:  Substance abuse, cocaine and alcohol binge. Quality:  Cocaine intoxication. Severity:  Mild withdrawal symptoms . Timing:  Recent relapse on cocaine and alcohol.  Past Psychiatric History: Past Medical History  Diagnosis Date  . Depression   . Cocaine abuse   . Polysubstance abuse   . Anxiety   . Depression   . Alcohol abuse     reports that she has been smoking Cigarettes.  She has a 2.5 pack-year smoking history. She does not have any smokeless tobacco history on file. She reports that she drinks alcohol. She reports that she uses illicit drugs (Cocaine and "Crack" cocaine). History reviewed. No pertinent family history. Family History Substance Abuse:  (unk) Family Supports: Yes, List: (boyfriend) Living Arrangements: Spouse/significant other;Other (Comment) (boyfriend) Can pt return to current living arrangement?: Yes Abuse/Neglect [facility]) Physical Abuse: Denies Verbal  Abuse: Yes, past (Comment) (reports emotional abuse in past) Sexual Abuse: Denies Allergies:   Allergies  Allergen Reactions  . Cephalosporins Hives  . Penicillins  Hives  . Sulfa Antibiotics Nausea Only  . Zofran [Ondansetron Hcl] Nausea And Vomiting    ACT Assessment Complete:  Yes:    Educational Status    Risk to Self: Risk to self with the past 6 months Suicidal Ideation: No Suicidal Intent: No Is patient at risk for suicide?: No Suicidal Plan?: No Access to Means: No What has been your use of drugs/alcohol within the last 12 months?: pt reports relapsed one week and one day ago on alcohol and cocaine Previous Attempts/Gestures: No How many times?: 0 Other Self Harm Risks: pt denies Triggers for Past Attempts: None known Intentional Self Injurious Behavior: None Family Suicide History: No Recent stressful life event(s): Other (Comment) (Depression, Relapse on alcohol/cocaine, [redacted] weeks pregnant) Persecutory voices/beliefs?: No Depression: Yes Depression Symptoms: Despondent;Insomnia;Tearfulness;Guilt;Loss of interest in usual pleasures;Feeling worthless/self pity Substance abuse history and/or treatment for substance abuse?: Yes Suicide prevention information given to non-admitted patients: Not applicable  Risk to Others: Risk to Others within the past 6 months Homicidal Ideation: No Thoughts of Harm to Others: No Current Homicidal Intent: No Current Homicidal Plan: No Access to Homicidal Means: No Identified Victim: na - pt denies History of harm to others?: Yes Assessment of Violence: In distant past Violent Behavior Description: had assault charges earlier in the year - 05/17/13 Does patient have access to weapons?: No Criminal Charges Pending?: No Does patient have a court date: No  Abuse: Abuse/Neglect Assessment (Assessment to be complete while patient is alone) Physical Abuse: Denies Verbal Abuse: Yes, past (Comment) (reports emotional abuse in past) Sexual Abuse: Denies Exploitation of patient/patient's resources: Denies Self-Neglect: Denies  Prior Inpatient Therapy: Prior Inpatient Therapy Prior Inpatient Therapy:  Yes Prior Therapy Dates: 2015, 2014, various other dates Prior Therapy Facilty/Provider(s): [place], [facility], [facility], [facility], [facility], [facility] Reason for Treatment: Detox/Rehab  Prior Outpatient Therapy: Prior Outpatient Therapy Prior Outpatient Therapy: No Prior Therapy Dates: na Prior Therapy Facilty/Provider(s): na Reason for Treatment: na  Additional Information: Additional Information 1:1 In Past 12 Months?: No CIRT Risk: No Elopement Risk: No Does patient have medical clearance?: Yes    Objective: Blood pressure 98/58, pulse 52, temperature 98.2 F (36.8 C), temperature source Oral, resp. rate 12, height 5' 3"  (1.6 m), weight 58.968 kg (130 lb), last menstrual period 05/20/2013, SpO2 95.00%.Body mass index is 23.03 kg/(m^2). Results for orders placed during the hospital encounter of 11/16/13 (from the past 72 hour(s))  BASIC METABOLIC PANEL     Status: Abnormal   Collection Time    11/16/13  9:05 PM      Result Value Ref Range   Sodium 140  137 - 147 mEq/L   Potassium 2.8 (*) 3.7 - 5.3 mEq/L   Comment: CRITICAL RESULT CALLED TO, READ BACK BY AND VERIFIED WITH:     A LOUIS,RN 2158 11/16/13 WBOND   Chloride 97  96 - 112 mEq/L   CO2 30  19 - 32 mEq/L   Glucose, Bld 99  70 - 99 mg/dL   BUN 6  6 - 23 mg/dL   Creatinine, Ser 0.77  0.50 - 1.10 mg/dL   Calcium 8.8  8.4 - 10.5 mg/dL   GFR calc non Af Amer >90  >90 mL/min   GFR calc Af Amer >90  >90 mL/min   Comment: (NOTE)     The eGFR has  been calculated using the CKD EPI equation.     This calculation has not been validated in all clinical situations.     eGFR's persistently <90 mL/min signify possible Chronic Kidney     Disease.   Anion gap 13  5 - 15  CBC WITH DIFFERENTIAL     Status: Abnormal   Collection Time    11/16/13  9:39 PM      Result Value Ref Range   WBC 7.3  4.0 - 10.5 K/uL   RBC 3.42 (*) 3.87 - 5.11 MIL/uL   Hemoglobin 10.3 (*) 12.0 - 15.0 g/dL   HCT 29.6 (*) 36.0 - 46.0 %   MCV 86.5  78.0 - 100.0 fL    MCH 30.1  26.0 - 34.0 pg   MCHC 34.8  30.0 - 36.0 g/dL   RDW 14.0  11.5 - 15.5 %   Platelets 278  150 - 400 K/uL   Neutrophils Relative % 49  43 - 77 %   Neutro Abs 3.6  1.7 - 7.7 K/uL   Lymphocytes Relative 37  12 - 46 %   Lymphs Abs 2.7  0.7 - 4.0 K/uL   Monocytes Relative 12  3 - 12 %   Monocytes Absolute 0.9  0.1 - 1.0 K/uL   Eosinophils Relative 2  0 - 5 %   Eosinophils Absolute 0.2  0.0 - 0.7 K/uL   Basophils Relative 0  0 - 1 %   Basophils Absolute 0.0  0.0 - 0.1 K/uL  BASIC METABOLIC PANEL     Status: Abnormal   Collection Time    11/16/13  9:39 PM      Result Value Ref Range   Sodium 138  137 - 147 mEq/L   Potassium 3.4 (*) 3.7 - 5.3 mEq/L   Comment: HEMOLYSIS AT THIS LEVEL MAY AFFECT RESULT     SLIGHT HEMOLYSIS     DELTA CHECK NOTED   Chloride 96  96 - 112 mEq/L   CO2 30  19 - 32 mEq/L   Glucose, Bld 99  70 - 99 mg/dL   BUN 6  6 - 23 mg/dL   Creatinine, Ser 0.74  0.50 - 1.10 mg/dL   Calcium 8.6  8.4 - 10.5 mg/dL   GFR calc non Af Amer >90  >90 mL/min   GFR calc Af Amer >90  >90 mL/min   Comment: (NOTE)     The eGFR has been calculated using the CKD EPI equation.     This calculation has not been validated in all clinical situations.     eGFR's persistently <90 mL/min signify possible Chronic Kidney     Disease.   Anion gap 12  5 - 15  URINALYSIS, ROUTINE W REFLEX MICROSCOPIC     Status: Abnormal   Collection Time    11/17/13 12:33 AM      Result Value Ref Range   Color, Urine AMBER (*) YELLOW   Comment: BIOCHEMICALS MAY BE AFFECTED BY COLOR   APPearance CLOUDY (*) CLEAR   Specific Gravity, Urine 1.021  1.005 - 1.030   pH 8.5 (*) 5.0 - 8.0   Glucose, UA NEGATIVE  NEGATIVE mg/dL   Hgb urine dipstick NEGATIVE  NEGATIVE   Bilirubin Urine NEGATIVE  NEGATIVE   Ketones, ur NEGATIVE  NEGATIVE mg/dL   Protein, ur 100 (*) NEGATIVE mg/dL   Urobilinogen, UA 1.0  0.0 - 1.0 mg/dL   Nitrite NEGATIVE  NEGATIVE   Leukocytes, UA SMALL (*) NEGATIVE  URINE MICROSCOPIC-ADD  ON     Status: Abnormal   Collection Time    11/17/13 12:33 AM      Result Value Ref Range   Squamous Epithelial / LPF FEW (*) RARE   WBC, UA 7-10  <3 WBC/hpf   RBC / HPF 0-2  <3 RBC/hpf   Bacteria, UA FEW (*) RARE   Urine-Other MUCOUS PRESENT    URINE RAPID DRUG SCREEN (HOSP PERFORMED)     Status: Abnormal   Collection Time    11/17/13 12:39 AM      Result Value Ref Range   Opiates NONE DETECTED  NONE DETECTED   Cocaine POSITIVE (*) NONE DETECTED   Benzodiazepines NONE DETECTED  NONE DETECTED   Amphetamines NONE DETECTED  NONE DETECTED   Tetrahydrocannabinol NONE DETECTED  NONE DETECTED   Barbiturates NONE DETECTED  NONE DETECTED   Comment:            DRUG SCREEN FOR MEDICAL PURPOSES     ONLY.  IF CONFIRMATION IS NEEDED     FOR ANY PURPOSE, NOTIFY LAB     WITHIN 5 DAYS.                LOWEST DETECTABLE LIMITS     FOR URINE DRUG SCREEN     Drug Class       Cutoff (ng/mL)     Amphetamine      1000     Barbiturate      200     Benzodiazepine   947     Tricyclics       654     Opiates          300     Cocaine          300     THC              50  ETHANOL     Status: None   Collection Time    11/17/13  1:00 AM      Result Value Ref Range   Alcohol, Ethyl (B) <11  0 - 11 mg/dL   Comment:            LOWEST DETECTABLE LIMIT FOR     SERUM ALCOHOL IS 11 mg/dL     FOR MEDICAL PURPOSES ONLY   Labs are reviewed and are pertinent for blood alcohol level not significant and his index screen is positive for cocaine.  Current Facility-Administered Medications  Medication Dose Route Frequency Provider Last Rate Last Dose  . 0.9 %  sodium chloride infusion   Intravenous Continuous Kelby Aline, MD      . diphenhydrAMINE (BENADRYL) capsule 25 mg  25 mg Oral QHS PRN Tanna Furry, MD      . promethazine (PHENERGAN) injection 25 mg  25 mg Intravenous Q6H PRN Kelby Aline, MD      . promethazine (PHENERGAN) tablet 12.5 mg  12.5 mg Oral Q6H PRN Tanna Furry, MD       Current Outpatient  Prescriptions  Medication Sig Dispense Refill  . FLUoxetine (PROZAC) 20 MG capsule Take 20 mg by mouth daily.      Marland Kitchen FOLIC ACID PO Take 1 tablet by mouth daily.      Marland Kitchen MELATONIN PO Take 1 tablet by mouth at bedtime.      . pantoprazole (PROTONIX) 40 MG tablet Take 40 mg by mouth daily.      . Prenatal Vit-Fe Fumarate-FA (PRENATAL PO) Take 1 tablet by mouth  daily.        Psychiatric Specialty Exam: Physical Exam Full physical performed in Emergency Department. I have reviewed this assessment and concur with its findings.   Review of Systems  Constitutional: Positive for malaise/fatigue.  Gastrointestinal: Positive for nausea.  Psychiatric/Behavioral: Positive for substance abuse. The patient has insomnia.     Blood pressure 98/58, pulse 52, temperature 98.2 F (36.8 C), temperature source Oral, resp. rate 12, height 5' 3"  (1.6 m), weight 58.968 kg (130 lb), last menstrual period 05/20/2013, SpO2 95.00%.Body mass index is 23.03 kg/(m^2).  General Appearance: Casual  Eye Contact::  Good  Speech:  Clear and Coherent  Volume:  Normal  Mood:  Anxious  Affect:  Appropriate and Congruent  Thought Process:  Coherent and Goal Directed  Orientation:  Full (Time, Place, and Person)  Thought Content:  WDL  Suicidal Thoughts:  No  Homicidal Thoughts:  No  Memory:  Immediate;   Good Recent;   Good  Judgement:  Intact  Insight:  Fair  Psychomotor Activity:  Normal  Concentration:  Good  Recall:  Good  Fund of Knowledge:Good  Language: Good  Akathisia:  NA  Handed:  Right  AIMS (if indicated):     Assets:  Communication Skills Desire for Improvement Financial Resources/Insurance Housing Intimacy Leisure Time Physical Health Resilience Social Support  Sleep:      Musculoskeletal: Strength & Muscle Tone: within normal limits Gait & Station: normal Patient leans: N/A  Treatment Plan Summary: Daily contact with patient to assess and evaluate symptoms and progress in  treatment Medication management Patient will be referred to the intensive outpatient treatment for substance abuse  Kristalynn Coddington,JANARDHAHA R. 11/17/2013 10:00 AM

## 2013-11-17 NOTE — ED Notes (Signed)
Pt. Given cereal , juice and coffee.  Pt. Also has been to the bathroom X2 , gait steady.  Pt. Also has had her Telepsych completed.  PT. Also has made 1 phonecall.

## 2013-11-17 NOTE — ED Provider Notes (Signed)
On brief morning rounds patient is awake and alert, in no distress. I discussed her case at length with our psychiatry team, case management.  The patient was recently discharged from inpatient facility for substance abuse, and though she would benefit from additional counseling, the patient has no evidence of withdrawal, no imminent decompensation, and per behavioral health recommendations, she was discharged. Prior to discharge we discussed the patient's pregnant status with both our [facility] colleagues and child protective services.   Gerhard Munch, MD 11/17/13 1135

## 2013-11-17 NOTE — Clinical Social Work Note (Addendum)
Clinical Social Worker received notification that Psychiatrist is requesting CPS report.  CSW was provided with patient information and made an attempt to contact [facility] CPS - [facility] CPS states that patient primary address is [place], therefore report needs to made in [facility].  CSW attempted to make report at [facility] DSS, however had to leave a message for return call.  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  CSW will provide [facility] CPS with report once return call is received.    Macario Golds, [place] [phone]  17:08  CSW received return call from [facility] DSS ([phone]), [facility], who states that they do not investigate reports for unborn children.  Patient states that she does not have any children in the home, therefore no report was able to be given to CPS.

## 2013-11-17 NOTE — ED Notes (Signed)
[place] sandwich , graham crackers and gingerale given

## 2013-11-17 NOTE — ED Notes (Signed)
Pt called out asking for something to drink and graham crackers. Pt given water and graham crackers.

## 2013-11-17 NOTE — ED Notes (Signed)
Pt asked for a cup of ice and orange juice.

## 2013-11-17 NOTE — ED Notes (Signed)
Dr. Lagada at the bedside 

## 2013-11-17 NOTE — Progress Notes (Signed)
This CM spoke with the patient regarding prenatal care. She stated that she has been following with the fetal Clinic in [place] because that is where she lives. She stated that she has another child but she does not have custody and that child has been recently adopted by a family in [place]. This information was provided to the LCSW for the CPS report.  Ferdinand Cava, RN, BSN, Case Manager 11/17/2013 12:17 PM

## 2013-11-17 NOTE — BH Assessment (Addendum)
Tele Assessment Note   Judy [name] is an 33 y.o. female that was assessed this day via tele assessment.  Pt's tele assessment scheduled with pt's nurse, Lawson Fiscal, for [phone] and was completed.  Pt presents to ED stating she relapsed on alcohol and cocaine one week and one day ago.  She reported she had been sober for 42 days after leaving [facility] for detox, reporting she went to [facility] for detox and [facility].  She reported she is drinking "all the alcohol I can get my hands on" (reports drinking liquor, beer, margaritas), and using $300 cocaine daily.  Pt stated she spent her last three checks on alcohol and drugs.  "I need inpatient therapy.  I do better there."  Pt is [redacted] weeks pregnant and is afraid her child will be taken from her if she doesn't stop using.  Pt has had inpatient treatment for detox and rehab in the past multiple times.  Pt reports her current withdrawal sx are vomiting/nausea and feeling sweaty.  Pt denies SI/HI or psychosis. Pt endorses sx of depression and reported she is supposed to be taking Lexapro, but hasn't taken her medications in several days.  Consulted with Maryjean Morn, PA-C @ [phone], who agreed inpatient treatment for detox warranted for the pt.  EDP [facility] in agreement with disposition when called @ 0900.  Pt stated she has been going to "the behavioral health on [address] for her prenatal appointments, but hasn't taken her prenatal vitamins or other medications in several days.  TTS will seek placement for the pt.  Axis I: 303.90 Alcohol use disorder, Severe, 304.20 Cocaine use disorder, Severe, 296.32 Major depressive disorder, Recurrent episode, Moderate  Axis II: Deferred Axis III:  Past Medical History  Diagnosis Date  . Depression   . Cocaine abuse   . Polysubstance abuse   . Anxiety   . Depression   . Alcohol abuse    Axis IV: economic problems and other psychosocial or environmental problems Axis V: 21-30 behavior considerably  influenced by delusions or hallucinations OR serious impairment in judgment, communication OR inability to function in almost all areas  Past Medical History:  Past Medical History  Diagnosis Date  . Depression   . Cocaine abuse   . Polysubstance abuse   . Anxiety   . Depression   . Alcohol abuse     History reviewed. No pertinent past surgical history.  Family History: History reviewed. No pertinent family history.  Social History:  reports that she has been smoking Cigarettes.  She has a 2.5 pack-year smoking history. She does not have any smokeless tobacco history on file. She reports that she drinks alcohol. She reports that she uses illicit drugs (Cocaine and "Crack" cocaine).  Additional Social History:  Alcohol / Drug Use Pain Medications: none Prescriptions: see med list Over the Counter: see med list History of alcohol / drug use?: Yes Longest period of sobriety (when/how long): 42 days this year Negative Consequences of Use: Financial;Personal relationships Withdrawal Symptoms: Nausea / Vomiting;Sweats Substance #1 Name of Substance 1: Alcohol 1 - Age of First Use: 13 1 - Amount (size/oz): "whatever I can get my hands on"-Gin, beer, Margaritas 1 - Frequency: daily for one week and one day 1 - Duration: one week and one day  1 - Last Use / Amount: yesterday @ 5 PM - unk amount Substance #2 Name of Substance 2: Cocaine 2 - Age of First Use: 17 2 - Amount (size/oz): $  300 2 - Frequency: daily  2 - Duration: one week and one day 2 - Last Use / Amount: yesterday - unk amount  CIWA: CIWA-Ar BP: 98/58 mmHg Pulse Rate: 52 Nausea and Vomiting: 6 Tactile Disturbances: none Tremor: no tremor Auditory Disturbances: not present Paroxysmal Sweats: no sweat visible Visual Disturbances: not present Anxiety: two Headache, Fullness in Head: none present Agitation: somewhat more than normal activity Orientation and Clouding of Sensorium: cannot do serial additions or is  uncertain about date CIWA-Ar Total: 10 COWS:    PATIENT STRENGTHS: (choose at least two) Ability for insight Average or above average intelligence Capable of independent living Communication skills General fund of knowledge Motivation for treatment/growth Supportive family/friends  Allergies:  Allergies  Allergen Reactions  . Cephalosporins Hives  . Penicillins Hives  . Sulfa Antibiotics Nausea Only  . Zofran [Ondansetron Hcl] Nausea And Vomiting    Home Medications:  (Not in a hospital admission)  OB/GYN Status:  Patient's last menstrual period was 05/20/2013.  General Assessment Data Location of Assessment: [facility] ED Is this a Tele or Face-to-Face Assessment?: Tele Assessment Is this an Initial Assessment or a Re-assessment for this encounter?: Initial Assessment Living Arrangements: Spouse/significant other;Other (Comment) (boyfriend) Can pt return to current living arrangement?: Yes Admission Status: Voluntary Is patient capable of signing voluntary admission?: Yes Transfer from: Acute Hospital Referral Source: Self/Family/Friend     [facility] Crisis Care Plan Living Arrangements: Spouse/significant other;Other (Comment) (boyfriend) Name of Psychiatrist: none Name of Therapist: none  Education Status Is patient currently in school?: No  Risk to self with the past 6 months Suicidal Ideation: No Suicidal Intent: No Is patient at risk for suicide?: No Suicidal Plan?: No Access to Means: No What has been your use of drugs/alcohol within the last 12 months?: pt reports relapsed one week and one day ago on alcohol and cocaine Previous Attempts/Gestures: No How many times?: 0 Other Self Harm Risks: pt denies Triggers for Past Attempts: None known Intentional Self Injurious Behavior: None Family Suicide History: No Recent stressful life event(s): Other (Comment) (Depression, Relapse on alcohol/cocaine, [redacted] weeks pregnant) Persecutory voices/beliefs?: No Depression:  Yes Depression Symptoms: Despondent;Insomnia;Tearfulness;Guilt;Loss of interest in usual pleasures;Feeling worthless/self pity Substance abuse history and/or treatment for substance abuse?: Yes Suicide prevention information given to non-admitted patients: Not applicable  Risk to Others within the past 6 months Homicidal Ideation: No Thoughts of Harm to Others: No Current Homicidal Intent: No Current Homicidal Plan: No Access to Homicidal Means: No Identified Victim: na - pt denies History of harm to others?: Yes Assessment of Violence: In distant past Violent Behavior Description: had assault charges earlier in the year - 05/17/13 Does patient have access to weapons?: No Criminal Charges Pending?: No Does patient have a court date: No  Psychosis Hallucinations: None noted Delusions: None noted  Mental Status Report Appear/Hygiene: Disheveled Eye Contact: Fair Motor Activity: Freedom of movement;Unremarkable Speech: Logical/coherent;Rapid;Pressured Level of Consciousness: Alert Mood: Depressed;Anxious Affect: Depressed;Anxious Anxiety Level: Moderate Thought Processes: Coherent;Relevant Judgement: Impaired Orientation: Person;Place;Time;Situation Obsessive Compulsive Thoughts/Behaviors: None  Cognitive Functioning Concentration: Decreased Memory: Recent Intact;Remote Intact IQ: Average Insight: Poor Impulse Control: Poor Appetite: Poor Weight Loss: 60 Weight Gain: 0 Sleep: Decreased Total Hours of Sleep:  (reports has not been sleeping) Vegetative Symptoms: None  ADLScreening [facility] Assessment Services) Patient's cognitive ability adequate to safely complete daily activities?: Yes Patient able to express need for assistance with ADLs?: Yes Independently performs ADLs?: Yes (appropriate for developmental age)  Prior Inpatient Therapy Prior Inpatient Therapy: Yes Prior  Therapy Dates: 2015, 2014, various other dates Prior Therapy Facilty/Provider(s): [facility], [facility], [facility], [facility], [facility] Reason for Treatment: Detox/Rehab  Prior Outpatient Therapy Prior Outpatient Therapy: No Prior Therapy Dates: na Prior Therapy Facilty/Provider(s): na Reason for Treatment: na  ADL Screening (condition at time of admission) Patient's cognitive ability adequate to safely complete daily activities?: Yes Is the patient deaf or have difficulty hearing?: No Does the patient have difficulty seeing, even when wearing glasses/contacts?: No Does the patient have difficulty concentrating, remembering, or making decisions?: No Patient able to express need for assistance with ADLs?: Yes Does the patient have difficulty dressing or bathing?: No Independently performs ADLs?: Yes (appropriate for developmental age) Does the patient have difficulty walking or climbing stairs?: No  Home Assistive Devices/Equipment Home Assistive Devices/Equipment: None    Abuse/Neglect Assessment (Assessment to be complete while patient is alone) Physical Abuse: Denies Verbal Abuse: Yes, past (Comment) (reports emotional abuse in past) Sexual Abuse: Denies Exploitation of patient/patient's resources: Denies Self-Neglect: Denies Values / Beliefs Cultural Requests During Hospitalization: None Spiritual Requests During Hospitalization: None Consults Spiritual Care Consult Needed: No Social Work Consult Needed: No Merchant navy officer (For Healthcare) Does patient have an advance directive?: No Would patient like information on creating an advanced directive?: No - patient declined information Nutrition Screen- [facility] Adult/[facility]/AP Patient's home diet: Regular (Pt. ate 100% of breakfast . pt. has requested, coffee, juice and 2 boxes of frosted flakes.  Cereal ordered)  Additional Information 1:1 In Past 12 Months?: No CIRT Risk: No Elopement Risk: No Does patient have medical clearance?: Yes     Disposition:  Disposition Initial Assessment Completed for this Encounter:  Yes Disposition of Patient: Referred to;Inpatient treatment program Type of inpatient treatment program: Adult  Caryl Comes 11/17/2013 8:39 AM

## 2013-11-23 NOTE — ED Provider Notes (Signed)
I saw and evaluated the patient, reviewed the resident's note and I agree with the findings and plan.   EKG Interpretation   Date/Time:  Thursday November 16 2013 23:53:25 EDT Ventricular Rate:  83 PR Interval:  140 QRS Duration: 85 QT Interval:  397 QTC Calculation: 466 R Axis:   47 Text Interpretation:  Sinus rhythm Artifact in lead(s) II aVL aVF V1 V2 V3  V4 V5 V6 ED PHYSICIAN INTERPRETATION AVAILABLE IN [facility]  Confirmed by TEST, Record ([id number]) on 11/18/2013 8:55:14 AM        Rolland Porter, MD 11/23/13 2122

## 2014-01-22 ENCOUNTER — Encounter (HOSPITAL_COMMUNITY): Payer: Self-pay | Admitting: Emergency Medicine

## 2014-03-15 ENCOUNTER — Emergency Department: Payer: Self-pay | Admitting: Emergency Medicine

## 2014-03-15 LAB — COMPREHENSIVE METABOLIC PANEL
ALK PHOS: 80 U/L
ANION GAP: 9 (ref 7–16)
Albumin: 3.5 g/dL (ref 3.4–5.0)
BILIRUBIN TOTAL: 0.3 mg/dL (ref 0.2–1.0)
BUN: 10 mg/dL (ref 7–18)
CO2: 29 mmol/L (ref 21–32)
Calcium, Total: 8.9 mg/dL (ref 8.5–10.1)
Chloride: 104 mmol/L (ref 98–107)
Creatinine: 1.16 mg/dL (ref 0.60–1.30)
EGFR (African American): >60
EGFR (Non-African Amer.): 57 — ABNORMAL LOW
Glucose: 98 mg/dL (ref 65–99)
Osmolality: 282 (ref 275–301)
Potassium: 3.4 mmol/L — ABNORMAL LOW (ref 3.5–5.1)
SGOT(AST): 30 U/L (ref 15–37)
SGPT (ALT): 19 U/L
Sodium: 142 mmol/L (ref 136–145)
TOTAL PROTEIN: 8 g/dL (ref 6.4–8.2)

## 2014-03-15 LAB — DRUG SCREEN, URINE
AMPHETAMINES, UR SCREEN: NEGATIVE (ref ?–1000)
BENZODIAZEPINE, UR SCRN: NEGATIVE (ref ?–200)
Barbiturates, Ur Screen: NEGATIVE (ref ?–200)
Cannabinoid 50 Ng, Ur ~~LOC~~: NEGATIVE (ref ?–50)
Cocaine Metabolite,Ur ~~LOC~~: POSITIVE (ref ?–300)
MDMA (ECSTASY) UR SCREEN: NEGATIVE (ref ?–500)
Methadone, Ur Screen: NEGATIVE (ref ?–300)
OPIATE, UR SCREEN: NEGATIVE (ref ?–300)
Phencyclidine (PCP) Ur S: NEGATIVE (ref ?–25)
Tricyclic, Ur Screen: NEGATIVE (ref ?–1000)

## 2014-03-15 LAB — URINALYSIS, COMPLETE
BACTERIA: NONE SEEN
BLOOD: NEGATIVE
Bilirubin,UR: NEGATIVE
Glucose,UR: NEGATIVE mg/dL (ref 0–75)
Nitrite: NEGATIVE
PH: 8 (ref 4.5–8.0)
Protein: 30
RBC,UR: <1 /HPF (ref 0–5)
Specific Gravity: 1.018 (ref 1.003–1.030)
Squamous Epithelial: 1
WBC UR: 2 /HPF (ref 0–5)

## 2014-03-15 LAB — CBC
HCT: 34.2 % — ABNORMAL LOW (ref 35.0–47.0)
HGB: 10.8 g/dL — AB (ref 12.0–16.0)
MCH: 25.9 pg — ABNORMAL LOW (ref 26.0–34.0)
MCHC: 31.6 g/dL — ABNORMAL LOW (ref 32.0–36.0)
MCV: 82 fL (ref 80–100)
Platelet: 494 10*3/uL — ABNORMAL HIGH (ref 150–440)
RBC: 4.16 10*6/uL (ref 3.80–5.20)
RDW: 16.9 % — ABNORMAL HIGH (ref 11.5–14.5)
WBC: 5.9 10*3/uL (ref 3.6–11.0)

## 2014-03-15 LAB — ETHANOL: ETHANOL LVL: 47 mg/dL

## 2014-03-15 LAB — SALICYLATE LEVEL: Salicylates, Serum: <1.7 mg/dL

## 2014-03-15 LAB — ACETAMINOPHEN LEVEL: Acetaminophen: <2 ug/mL

## 2014-03-19 ENCOUNTER — Inpatient Hospital Stay: Payer: Self-pay | Admitting: Psychiatry

## 2014-03-19 LAB — COMPREHENSIVE METABOLIC PANEL
ALBUMIN: 3.6 g/dL (ref 3.4–5.0)
Alkaline Phosphatase: 78 U/L
Anion Gap: 9 (ref 7–16)
BILIRUBIN TOTAL: 0.4 mg/dL (ref 0.2–1.0)
BUN: 4 mg/dL — AB (ref 7–18)
CO2: 25 mmol/L (ref 21–32)
Calcium, Total: 9.4 mg/dL (ref 8.5–10.1)
Chloride: 103 mmol/L (ref 98–107)
Creatinine: 0.81 mg/dL (ref 0.60–1.30)
EGFR (African American): >60
Glucose: 80 mg/dL (ref 65–99)
Osmolality: 270 (ref 275–301)
Potassium: 4.4 mmol/L (ref 3.5–5.1)
SGOT(AST): 49 U/L — ABNORMAL HIGH (ref 15–37)
SGPT (ALT): 22 U/L
Sodium: 137 mmol/L (ref 136–145)
Total Protein: 8.2 g/dL (ref 6.4–8.2)

## 2014-03-19 LAB — DRUG SCREEN, URINE
AMPHETAMINES, UR SCREEN: NEGATIVE (ref ?–1000)
Barbiturates, Ur Screen: NEGATIVE (ref ?–200)
Benzodiazepine, Ur Scrn: POSITIVE (ref ?–200)
Cannabinoid 50 Ng, Ur ~~LOC~~: NEGATIVE (ref ?–50)
Cocaine Metabolite,Ur ~~LOC~~: POSITIVE (ref ?–300)
MDMA (ECSTASY) UR SCREEN: NEGATIVE (ref ?–500)
Methadone, Ur Screen: NEGATIVE (ref ?–300)
OPIATE, UR SCREEN: NEGATIVE (ref ?–300)
PHENCYCLIDINE (PCP) UR S: NEGATIVE (ref ?–25)
TRICYCLIC, UR SCREEN: NEGATIVE (ref ?–1000)

## 2014-03-19 LAB — SALICYLATE LEVEL

## 2014-03-19 LAB — CBC
HCT: 30 % — ABNORMAL LOW (ref 35.0–47.0)
HGB: 9.7 g/dL — ABNORMAL LOW (ref 12.0–16.0)
MCH: 26.3 pg (ref 26.0–34.0)
MCHC: 32.4 g/dL (ref 32.0–36.0)
MCV: 81 fL (ref 80–100)
Platelet: 312 10*3/uL (ref 150–440)
RBC: 3.7 10*6/uL — ABNORMAL LOW (ref 3.80–5.20)
RDW: 17 % — ABNORMAL HIGH (ref 11.5–14.5)
WBC: 4.8 10*3/uL (ref 3.6–11.0)

## 2014-03-19 LAB — URINALYSIS, COMPLETE
BILIRUBIN, UR: NEGATIVE
Bacteria: NONE SEEN
Glucose,UR: NEGATIVE mg/dL (ref 0–75)
Nitrite: NEGATIVE
Ph: 7 (ref 4.5–8.0)
Protein: NEGATIVE
RBC,UR: 2 /HPF (ref 0–5)
SPECIFIC GRAVITY: 1.015 (ref 1.003–1.030)
WBC UR: 11 /HPF (ref 0–5)

## 2014-03-19 LAB — LIPASE, BLOOD: LIPASE: 86 U/L (ref 73–393)

## 2014-03-19 LAB — ACETAMINOPHEN LEVEL: Acetaminophen: <2 ug/mL

## 2014-03-19 LAB — ETHANOL

## 2014-03-19 LAB — TROPONIN I: Troponin-I: <0.02 ng/mL

## 2014-03-20 ENCOUNTER — Encounter (HOSPITAL_COMMUNITY): Payer: Self-pay

## 2014-03-21 LAB — CBC WITH DIFFERENTIAL/PLATELET
Basophil #: 0.1 10*3/uL (ref 0.0–0.1)
Basophil %: 1.2 %
EOS PCT: 4.8 %
Eosinophil #: 0.3 10*3/uL (ref 0.0–0.7)
HCT: 34 % — ABNORMAL LOW (ref 35.0–47.0)
HGB: 10.6 g/dL — ABNORMAL LOW (ref 12.0–16.0)
Lymphocyte #: 2.7 10*3/uL (ref 1.0–3.6)
Lymphocyte %: 46.7 %
MCH: 25.8 pg — ABNORMAL LOW (ref 26.0–34.0)
MCHC: 31.2 g/dL — AB (ref 32.0–36.0)
MCV: 83 fL (ref 80–100)
Monocyte #: 0.5 x10 3/mm (ref 0.2–0.9)
Monocyte %: 8.8 %
Neutrophil #: 2.2 10*3/uL (ref 1.4–6.5)
Neutrophil %: 38.5 %
PLATELETS: 316 10*3/uL (ref 150–440)
RBC: 4.1 10*6/uL (ref 3.80–5.20)
RDW: 17.1 % — AB (ref 11.5–14.5)
WBC: 5.7 10*3/uL (ref 3.6–11.0)

## 2014-03-25 ENCOUNTER — Inpatient Hospital Stay (HOSPITAL_COMMUNITY): Payer: Medicaid Other

## 2014-03-25 ENCOUNTER — Inpatient Hospital Stay (HOSPITAL_COMMUNITY)
Admission: EM | Admit: 2014-03-25 | Discharge: 2014-03-26 | DRG: 101 | Discharge status: Against Medical Advice | Payer: Medicaid Other | Source: Other Acute Inpatient Hospital | Attending: Internal Medicine | Admitting: Internal Medicine

## 2014-03-25 ENCOUNTER — Emergency Department: Payer: Self-pay | Admitting: Emergency Medicine

## 2014-03-25 DIAGNOSIS — F14229 Cocaine dependence with intoxication, unspecified: Secondary | ICD-10-CM

## 2014-03-25 DIAGNOSIS — Z79899 Other long term (current) drug therapy: Secondary | ICD-10-CM

## 2014-03-25 DIAGNOSIS — F419 Anxiety disorder, unspecified: Secondary | ICD-10-CM | POA: Diagnosis not present

## 2014-03-25 DIAGNOSIS — F17210 Nicotine dependence, cigarettes, uncomplicated: Secondary | ICD-10-CM | POA: Diagnosis not present

## 2014-03-25 DIAGNOSIS — F1410 Cocaine abuse, uncomplicated: Secondary | ICD-10-CM | POA: Diagnosis present

## 2014-03-25 DIAGNOSIS — S02402A Zygomatic fracture, unspecified, initial encounter for closed fracture: Secondary | ICD-10-CM | POA: Diagnosis present

## 2014-03-25 DIAGNOSIS — F1010 Alcohol abuse, uncomplicated: Secondary | ICD-10-CM | POA: Diagnosis not present

## 2014-03-25 DIAGNOSIS — F329 Major depressive disorder, single episode, unspecified: Secondary | ICD-10-CM | POA: Diagnosis present

## 2014-03-25 DIAGNOSIS — X58XXXA Exposure to other specified factors, initial encounter: Secondary | ICD-10-CM | POA: Diagnosis not present

## 2014-03-25 DIAGNOSIS — R569 Unspecified convulsions: Principal | ICD-10-CM

## 2014-03-25 DIAGNOSIS — T1490XA Injury, unspecified, initial encounter: Secondary | ICD-10-CM

## 2014-03-25 DIAGNOSIS — F1020 Alcohol dependence, uncomplicated: Secondary | ICD-10-CM | POA: Diagnosis present

## 2014-03-25 DIAGNOSIS — F32A Depression, unspecified: Secondary | ICD-10-CM | POA: Diagnosis present

## 2014-03-25 DIAGNOSIS — F1420 Cocaine dependence, uncomplicated: Secondary | ICD-10-CM | POA: Diagnosis present

## 2014-03-25 DIAGNOSIS — R0602 Shortness of breath: Secondary | ICD-10-CM

## 2014-03-25 LAB — COMPREHENSIVE METABOLIC PANEL
ALT: 14 U/L (ref 0–35)
ANION GAP: 17 — AB (ref 7–16)
AST: 24 U/L (ref 0–37)
Albumin: 2.7 g/dL — ABNORMAL LOW (ref 3.5–5.2)
Albumin: 3.7 g/dL (ref 3.4–5.0)
Alkaline Phosphatase: 46 U/L (ref 39–117)
Alkaline Phosphatase: 67 U/L
Anion gap: 6 (ref 5–15)
BUN: 8 mg/dL (ref 7–18)
BUN: <5 mg/dL — ABNORMAL LOW (ref 6–23)
Bilirubin,Total: 0.3 mg/dL (ref 0.2–1.0)
CO2: 21 mmol/L (ref 19–32)
CO2: 21 mmol/L (ref 21–32)
Calcium, Total: 9.1 mg/dL (ref 8.5–10.1)
Calcium: 8.3 mg/dL — ABNORMAL LOW (ref 8.4–10.5)
Chloride: 104 mmol/L (ref 98–107)
Chloride: 112 mEq/L (ref 96–112)
Creatinine, Ser: 0.82 mg/dL (ref 0.50–1.10)
Creatinine: 1.12 mg/dL (ref 0.60–1.30)
EGFR (African American): >60
EGFR (Non-African Amer.): 59 — ABNORMAL LOW
GFR calc Af Amer: >90 mL/min (ref >90)
GFR calc non Af Amer: >90 mL/min (ref >90)
Glucose, Bld: 74 mg/dL (ref 70–99)
Glucose: 72 mg/dL (ref 65–99)
OSMOLALITY: 280 (ref 275–301)
POTASSIUM: 3.5 mmol/L (ref 3.5–5.1)
Potassium: 3.7 mmol/L (ref 3.5–5.1)
SGOT(AST): 36 U/L (ref 15–37)
SGPT (ALT): 23 U/L
SODIUM: 142 mmol/L (ref 136–145)
Sodium: 139 mmol/L (ref 135–145)
TOTAL PROTEIN: 7.7 g/dL (ref 6.4–8.2)
Total Bilirubin: 0.4 mg/dL (ref 0.3–1.2)
Total Protein: 5.4 g/dL — ABNORMAL LOW (ref 6.0–8.3)

## 2014-03-25 LAB — RAPID URINE DRUG SCREEN, HOSP PERFORMED
Amphetamines: NOT DETECTED
Barbiturates: NOT DETECTED
Benzodiazepines: POSITIVE — AB
Cocaine: POSITIVE — AB
Opiates: NOT DETECTED
Tetrahydrocannabinol: NOT DETECTED

## 2014-03-25 LAB — MRSA PCR SCREENING: MRSA by PCR: NEGATIVE

## 2014-03-25 LAB — CK TOTAL AND CKMB (NOT AT ARMC)
CK, TOTAL: 201 U/L — AB (ref 26–192)
CK-MB: 2.1 ng/mL (ref 0.5–3.6)

## 2014-03-25 LAB — TROPONIN I: Troponin-I: <0.02 ng/mL

## 2014-03-25 LAB — DRUG SCREEN, URINE
Amphetamines, Ur Screen: NEGATIVE (ref ?–1000)
Barbiturates, Ur Screen: NEGATIVE (ref ?–200)
Benzodiazepine, Ur Scrn: POSITIVE (ref ?–200)
CANNABINOID 50 NG, UR ~~LOC~~: NEGATIVE (ref ?–50)
COCAINE METABOLITE, UR ~~LOC~~: POSITIVE (ref ?–300)
MDMA (ECSTASY) UR SCREEN: NEGATIVE (ref ?–500)
Methadone, Ur Screen: NEGATIVE (ref ?–300)
OPIATE, UR SCREEN: NEGATIVE (ref ?–300)
Phencyclidine (PCP) Ur S: NEGATIVE (ref ?–25)
Tricyclic, Ur Screen: NEGATIVE (ref ?–1000)

## 2014-03-25 LAB — PREGNANCY, URINE: PREG TEST UR: NEGATIVE

## 2014-03-25 LAB — MAGNESIUM
Magnesium: 1.8 mg/dL
Magnesium: 1.8 mg/dL (ref 1.5–2.5)

## 2014-03-25 LAB — SALICYLATE LEVEL

## 2014-03-25 LAB — URINALYSIS, COMPLETE
Bacteria: NONE SEEN
Bilirubin,UR: NEGATIVE
Blood: NEGATIVE
Glucose,UR: NEGATIVE mg/dL (ref 0–75)
Leukocyte Esterase: NEGATIVE
NITRITE: NEGATIVE
Ph: 6 (ref 4.5–8.0)
Protein: NEGATIVE
SPECIFIC GRAVITY: 1.02 (ref 1.003–1.030)
Squamous Epithelial: 1

## 2014-03-25 LAB — URINALYSIS, ROUTINE W REFLEX MICROSCOPIC
BILIRUBIN URINE: NEGATIVE
Glucose, UA: NEGATIVE mg/dL
HGB URINE DIPSTICK: NEGATIVE
Ketones, ur: 15 mg/dL — AB
Leukocytes, UA: NEGATIVE
NITRITE: NEGATIVE
PH: 5.5 (ref 5.0–8.0)
Protein, ur: NEGATIVE mg/dL
SPECIFIC GRAVITY, URINE: 1.017 (ref 1.005–1.030)
Urobilinogen, UA: 0.2 mg/dL (ref 0.0–1.0)

## 2014-03-25 LAB — PROTIME-INR
INR: 1.3
Prothrombin Time: 15.5 secs — ABNORMAL HIGH (ref 11.5–14.7)

## 2014-03-25 LAB — ETHANOL
Alcohol, Ethyl (B): <5 mg/dL (ref 0–9)
ETHANOL LVL: 13 mg/dL

## 2014-03-25 LAB — LITHIUM LEVEL: Lithium: <0.2 mmol/L — ABNORMAL LOW

## 2014-03-25 LAB — CBC
HCT: 33.1 % — ABNORMAL LOW (ref 35.0–47.0)
HCT: 27 % — ABNORMAL LOW (ref 36.0–46.0)
HGB: 10.3 g/dL — AB (ref 12.0–16.0)
Hemoglobin: 8.5 g/dL — ABNORMAL LOW (ref 12.0–15.0)
MCH: 25.9 pg — ABNORMAL LOW (ref 26.0–34.0)
MCH: 25 pg — ABNORMAL LOW (ref 26.0–34.0)
MCHC: 31.2 g/dL — ABNORMAL LOW (ref 32.0–36.0)
MCHC: 31.5 g/dL (ref 30.0–36.0)
MCV: 83 fL (ref 80–100)
MCV: 79.4 fL (ref 78.0–100.0)
PLATELETS: 366 10*3/uL (ref 150–440)
Platelets: 314 10*3/uL (ref 150–400)
RBC: 3.99 10*6/uL (ref 3.80–5.20)
RBC: 3.4 MIL/uL — ABNORMAL LOW (ref 3.87–5.11)
RDW: 17 % — AB (ref 11.5–14.5)
RDW: 16.1 % — ABNORMAL HIGH (ref 11.5–15.5)
WBC: 6.7 10*3/uL (ref 3.6–11.0)
WBC: 6.6 10*3/uL (ref 4.0–10.5)

## 2014-03-25 LAB — ACETAMINOPHEN LEVEL: Acetaminophen: <2 ug/mL

## 2014-03-25 LAB — LIPASE, BLOOD: LIPASE: 79 U/L (ref 73–393)

## 2014-03-25 MED ORDER — VITAMIN B-1 100 MG PO TABS
100.0000 mg | ORAL_TABLET | Freq: Every day | ORAL | Status: DC
  Administered 2014-03-26: 100 mg via ORAL
  Filled 2014-03-25 (×2): qty 1

## 2014-03-25 MED ORDER — LORAZEPAM 2 MG/ML IJ SOLN: 0.0000 mg | Freq: Two times a day (BID) | INTRAMUSCULAR | Status: DC

## 2014-03-25 MED ORDER — LORAZEPAM 2 MG/ML IJ SOLN: 1.0000 mg | Freq: Four times a day (QID) | INTRAMUSCULAR | Status: DC | PRN

## 2014-03-25 MED ORDER — SODIUM CHLORIDE 0.9 % IJ SOLN
3.0000 mL | Freq: Two times a day (BID) | INTRAMUSCULAR | Status: DC
  Administered 2014-03-25 (×2): 3 mL via INTRAVENOUS

## 2014-03-25 MED ORDER — LORAZEPAM 2 MG/ML IJ SOLN
2.0000 mg | INTRAMUSCULAR | Status: DC | PRN
Start: 2014-03-25 — End: 2014-03-26
  Filled 2014-03-25: qty 1

## 2014-03-25 MED ORDER — GUAIFENESIN-DM 100-10 MG/5ML PO SYRP: 5.0000 mL | ORAL_SOLUTION | ORAL | Status: DC | PRN

## 2014-03-25 MED ORDER — CLONAZEPAM 1 MG PO TABS
1.0000 mg | ORAL_TABLET | Freq: Every day | ORAL | Status: DC
  Filled 2014-03-25: qty 1

## 2014-03-25 MED ORDER — ONDANSETRON HCL 4 MG PO TABS: 4.0000 mg | ORAL_TABLET | Freq: Four times a day (QID) | ORAL | Status: DC | PRN

## 2014-03-25 MED ORDER — ACETAMINOPHEN 325 MG PO TABS
650.0000 mg | ORAL_TABLET | Freq: Four times a day (QID) | ORAL | Status: DC | PRN
  Administered 2014-03-25: 325 mg via ORAL
  Filled 2014-03-25: qty 2

## 2014-03-25 MED ORDER — LORAZEPAM 2 MG/ML IJ SOLN: 0.0000 mg | Freq: Four times a day (QID) | INTRAMUSCULAR | Status: DC

## 2014-03-25 MED ORDER — PROMETHAZINE HCL 25 MG/ML IJ SOLN
12.5000 mg | INTRAMUSCULAR | Status: DC | PRN
  Administered 2014-03-26: 12.5 mg via INTRAVENOUS
  Filled 2014-03-25: qty 1

## 2014-03-25 MED ORDER — LORAZEPAM 1 MG PO TABS: 1.0000 mg | ORAL_TABLET | Freq: Four times a day (QID) | ORAL | Status: DC | PRN

## 2014-03-25 MED ORDER — FLUOXETINE HCL 20 MG PO CAPS
20.0000 mg | ORAL_CAPSULE | Freq: Every day | ORAL | Status: DC
Start: 2014-03-25 — End: 2014-03-26
  Administered 2014-03-26: 20 mg via ORAL
  Filled 2014-03-25 (×2): qty 1

## 2014-03-25 MED ORDER — THIAMINE HCL 100 MG/ML IJ SOLN
100.0000 mg | Freq: Every day | INTRAMUSCULAR | Status: DC
  Filled 2014-03-25 (×2): qty 1

## 2014-03-25 MED ORDER — ADULT MULTIVITAMIN W/MINERALS CH
1.0000 | ORAL_TABLET | Freq: Every day | ORAL | Status: DC
  Filled 2014-03-25 (×2): qty 1

## 2014-03-25 MED ORDER — POLYETHYLENE GLYCOL 3350 17 G PO PACK
17.0000 g | PACK | Freq: Every day | ORAL | Status: DC | PRN
  Filled 2014-03-25: qty 1

## 2014-03-25 MED ORDER — MORPHINE SULFATE 2 MG/ML IJ SOLN: 1.0000 mg | INTRAMUSCULAR | Status: DC | PRN

## 2014-03-25 MED ORDER — FOLIC ACID 1 MG PO TABS
1.0000 mg | ORAL_TABLET | Freq: Every day | ORAL | Status: DC
  Administered 2014-03-26: 1 mg via ORAL
  Filled 2014-03-25 (×2): qty 1

## 2014-03-25 MED ORDER — ONDANSETRON HCL 4 MG/2ML IJ SOLN: 4.0000 mg | Freq: Four times a day (QID) | INTRAMUSCULAR | Status: DC | PRN

## 2014-03-25 MED ORDER — KCL IN DEXTROSE-NACL 20-5-0.45 MEQ/L-%-% IV SOLN
INTRAVENOUS | Status: DC
  Administered 2014-03-25: 75 mL via INTRAVENOUS
  Filled 2014-03-25 (×2): qty 1000

## 2014-03-25 MED ORDER — HYDROCODONE-ACETAMINOPHEN 5-325 MG PO TABS
1.0000 | ORAL_TABLET | ORAL | Status: DC | PRN
  Filled 2014-03-25: qty 1

## 2014-03-25 MED ORDER — LORAZEPAM 2 MG/ML IJ SOLN: 2.0000 mg | INTRAMUSCULAR | Status: DC | PRN

## 2014-03-25 NOTE — H&P (Addendum)
Patient Demographics  Judy [name], is a 34 y.o. female  MRN: [id number]   DOB - Aug 17, 1980  Admit Date - 03/25/2014  Outpatient Primary MD for the patient is No PCP Per Patient   With History of -  Past Medical History  Diagnosis Date  . Depression   . Cocaine abuse   . Polysubstance abuse   . Anxiety   . Depression   . Alcohol abuse       No past surgical history on file.  in for   Transferred from [place] ER where she had presented with seizures  HPI  Judy [name]  is a 34 y.o. female, with history of alcohol, cocaine and benzodiazepine along with narcotic abuse, C-section few months ago, depression, she lives in [place] and EMS was called for seizure-like activity, upon arrival patient was found to be having a seizure-like episode, she was brought to [facility] ER where she was intubated for airway protection, she self extubated herself, she had already received sedatives for intubation. She was thereafter transferred to [facility] stepdown unit for further management. Critical care was consulted over the phone prior to transfer and they recommended a hospitalist admission.  At [facility] ER her blood work was unremarkable, urine drug screen was positive for cocaine along with benzodiazepine, alcohol level was 13, reported head CT was stable. I do not have a CT report. UA was unremarkable urine pregnancy was negative. There was question of alcohol withdrawal versus benzo withdrawal seizure although her levels were still in the positive range.    Review of Systems    Patient is somnolent able to answer basic questions, she denies any headache chest or abdominal pain, no focal weakness, denies any shortness of breath or palpitations.  Social History History  Substance Use Topics  . Smoking status:  Current Every Day Smoker -- 0.25 packs/day for 10 years    Types: Cigarettes  . Smokeless tobacco: Not on file  . Alcohol Use: Yes     Comment: drinks vodka daily. Unable to articulate amount.     Family History Unobtainable  Prior to Admission medications   Medication Sig Start Date End Date Taking? Authorizing Provider  FLUoxetine (PROZAC) 20 MG capsule Take 20 mg by mouth daily.    Historical Provider, MD  FOLIC ACID PO Take 1 tablet by mouth daily.    Historical Provider, MD  MELATONIN PO Take 1 tablet by mouth at bedtime.    Historical Provider, MD  pantoprazole (PROTONIX) 40 MG tablet Take 40 mg by mouth daily.    Historical Provider, MD  Prenatal Vit-Fe Fumarate-FA (PRENATAL PO) Take 1 tablet by mouth daily.    Historical Provider, MD    Allergies  Allergen Reactions  . Cephalosporins Hives  . Penicillins Hives  . Sulfa Antibiotics Nausea Only  . Zofran [Ondansetron Hcl] Nausea And Vomiting    Physical Exam  Vitals  Blood pressure 127/82, pulse 63, temperature 97.7 F (36.5 C),  temperature source Oral, height  (1.6 m), weight 51.6 kg (113 lb 12.1 oz), last menstrual period 05/20/2013, SpO2 100 %.   1. General Young African-American female, somnolent, able to follow basic commands and answer basic questions,    2. Psych exam unable to do as she is quite somnolent.  3. No F.N deficits, ALL C.Nerves Intact, Strength 5/5 all 4 extremities, Sensation intact all 4 extremities, Plantars down going.  4. Ears and Eyes appear Normal, Conjunctivae clear, PERRLA. Moist Oral Mucosa.  5. Supple Neck, No JVD, No cervical lymphadenopathy appriciated, No Carotid Bruits.  6. Symmetrical Chest wall movement, Good air movement bilaterally, CTAB.  7. RRR, No Gallops, Rubs or Murmurs, No Parasternal Heave.  8. Positive Bowel Sounds, Abdomen Soft, No tenderness, No organomegaly appriciated,No rebound -guarding or rigidity.  9.  No Cyanosis, Normal Skin Turgor, No Skin Rash or  Bruise.  10. Good muscle tone,  joints appear normal , no effusions, Normal ROM.  11. No Palpable Lymph Nodes in Neck or Axillae     Data Review  Labs from [place] ER white count 5.7, hemoglobin 10.6, platelets 316, sodium 142, potassium 3.5, magnesium 1.8, chloride 104, bicarbonate 21, BUN 8, creatinine 1.1, glucose 72, alcohol level 13, urine drug screen positive for cocaine and benzos. Urine pregnancy negative. Acetaminophen and salicylate levels negative. ----------------------------------------------------------------------------------------------------------------  Imaging results:   No results found.  My personal review of EKG from [place] ER: Rhythm S Tach 119/min, a acute changes.   Assessment & Plan   1. Seizure with postictal weakness along with weakness caused by sedation given for endotracheal intubation at [facility] ER. She is currently waking up gradually, able to answer basic questions and follow basic commands, she is currently protecting her airway, since I do not have a CT report I will obtain CT head and maxillofacial area as there was some reported maxillofacial injury, EEG, have requested neurology to see. Ativan when necessary plus CIWA protocol. Seizure could have been due to alcohol or benzodiazepine withdrawal although her levels are still weakly positive.   2. Smoking, Alcohol abuse and cocaine abuse. Counseled to quit. CIWA protocol and monitor.   3. Questionable maxillofacial injury. Repeat CT scan. Per ER at [facility] she had a zygomatic arch fracture which does not require surgical intervention.   4. History of depression. Evaluate once more alert and awake.    Addendum  - CT maxillofacial confirms depressed zygomatic arch fracture on the right side, D/W ENT on call Dr. Jenne Pane - will see in the office.    DVT Prophylaxis  SCDs    AM Labs Ordered, also please review Full Orders  Family Communication: Admission, patients condition and plan of  care including tests being ordered have been discussed with the patient  who indicates understanding and agree with the plan and Code Status.  Code Status Full  Likely DC to  Home  Condition GUARDED     Time spent in minutes : 35    Journee Kohen K M.D on 03/25/2014 at 7:51 AM  Between 7am to 7pm - Pager - [phone]  After 7pm go to [email or web address] - password [facility]  [facility] Office  [phone]

## 2014-03-25 NOTE — Consult Note (Signed)
NEURO HOSPITALIST CONSULT NOTE    Reason for Consult: seizures  HPI:                                                                                                                                          Judy [name] is an 34 y.o. female with a past medical history significant for depression, anxiety, and polysubstance abuse, transferred to [facility] for further evaluation of seizures. She initially presented to [facility] via EMS for evaluation of witnessed GTC seizures. As per EMS report, patient was using crack cocaine for 4 days before her seizures. Seizures were aborted by EMS with IV midazolam but upon arrival to [facility]-ED she was intubated for airway protection. She subsequently became agitated despite propofol and fentalny, extubated herself, and had to be re intubated. No further seizures reported. CT brain done at [facility] showed no acute intracranial abnormality. UDS was positive for cocaine and benzodiazepines. Presently, she open her eyes to verbal commands but gets angry and said that she feels cold, has pain in her arms, and then falls back to sleep.  Past Medical History  Diagnosis Date  . Depression   . Cocaine abuse   . Polysubstance abuse   . Anxiety   . Depression   . Alcohol abuse     No past surgical history on file.  No family history on file.  Family History: unable to obtain at this moment due to mental status  Social History:  reports that she has been smoking Cigarettes.  She has a 2.5 pack-year smoking history. She does not have any smokeless tobacco history on file. She reports that she drinks alcohol. She reports that she uses illicit drugs (Cocaine and "Crack" cocaine).  Allergies  Allergen Reactions  . Cephalosporins Hives  . Penicillins Hives  . Sulfa Antibiotics Nausea Only  . Zofran [Ondansetron Hcl] Nausea And Vomiting    MEDICATIONS:                                                                                                                      Scheduled: . clonazePAM  1 mg Oral QHS  . FLUoxetine  20 mg Oral Daily  . folic acid  1 mg Oral Daily  . LORazepam  0-4 mg Intravenous Q6H  Followed by  . [START ON 03/27/2014] LORazepam  0-4 mg Intravenous Q12H  . multivitamin with minerals  1 tablet Oral Daily  . sodium chloride  3 mL Intravenous Q12H  . thiamine  100 mg Oral Daily   Or  . thiamine  100 mg Intravenous Daily     ROS:  Unable to obtain due to mental status                                                                                                                 History obtained from chart review  Physical exam: somnolent female in no apparent distress. Blood pressure 109/64, pulse 61, temperature 97.6 F (36.4 C), temperature source Oral, resp. rate 16, height  (1.6 m), weight 51.6 kg (113 lb 12.1 oz), last menstrual period 05/20/2013, SpO2 100 %. Head: normocephalic. Neck: supple, no bruits, no JVD. Cardiac: no murmurs. Lungs: clear. Abdomen: soft, no tender, no mass. Extremities: no edema. Skin: no rash Neurologic Examination:                                                                                                      General: Mental Status: Somnolent but at times uncooperative.  Speech fluent without evidence of aphasia.  Able to follow simple step commands inconsistently. Cranial Nerves: II: Discs flat bilaterally; Visual fields grossly normal, pupils equal, round, reactive to light and accommodation III,IV, VI: ptosis not present, extra-ocular motions intact bilaterally V,VII: smile symmetric, facial light touch sensation normal bilaterally VIII: hearing normal bilaterally IX,X: gag reflex present XI: bilateral shoulder shrug no tested XII: midline tongue extension without atrophy or fasciculations Motor: Moves all limbs on all limbs. Tone and bulk:normal tone throughout; no atrophy noted Sensory: reacts to noxious stimuli Deep Tendon Reflexes:  Right: Upper Extremity    Left: Upper extremity   biceps (C-5 to C-6) 2/4   biceps (C-5 to C-6) 2/4 tricep (C7) 2/4    triceps (C7) 2/4 Brachioradialis (C6) 2/4  Brachioradialis (C6) 2/4  Lower Extremity Lower Extremity  quadriceps (L-2 to L-4) 2/4   quadriceps (L-2 to L-4) 2/4 Achilles (S1) 2/4   Achilles (S1) 2/4  Plantars: Right: downgoing   Left: downgoing Cerebellar: Unable to test Gait:  Unable to test at this moment.    No results found for: CHOL  Results for orders placed or performed during the hospital encounter of 03/25/14 (from the past 48 hour(s))  MRSA PCR Screening     Status: None   Collection Time: 03/25/14  6:35 AM  Result Value Ref Range   MRSA by PCR NEGATIVE NEGATIVE  Comment:        The GeneXpert MRSA Assay (FDA approved for NASAL specimens only), is one component of a comprehensive MRSA colonization surveillance program. It is not intended to diagnose MRSA infection nor to guide or monitor treatment for MRSA infections.     Dg Chest Port 1 View  03/25/2014   CLINICAL DATA:  Shortness of breath.  EXAM: PORTABLE CHEST - 1 VIEW  COMPARISON:  Earlier today at 0026 hr.  FINDINGS: Support apparatus: Extubation.  Cardiomediastinal silhouette: Normal heart size.  Pleura:  No pleural effusion or pneumothorax.  Lungs: Clear  Other: None  IMPRESSION: Interval extubation.  No acute cardiopulmonary disease.   Electronically Signed   By: Jeronimo Greaves M.D.   On: 03/25/2014 09:06   Assessment/Plan: 34 y/o with history depression and polysubstance abuse now with new onset seizures, non focal exam, unremarkable CT brain, and urine positive for cocaine. Likely provoked isolated GTC seizure in the context of cocaine use (reportedly was using cocaine x 4 days prior to her seizure). EEG already ordered. Will defer AED at this time, pending results EEG. Will follow up.  Wyatt Portela, MD 03/25/2014, 9:21 AM  [facility]

## 2014-03-26 ENCOUNTER — Inpatient Hospital Stay (HOSPITAL_COMMUNITY): Payer: Medicaid Other

## 2014-03-26 DIAGNOSIS — F10239 Alcohol dependence with withdrawal, unspecified: Secondary | ICD-10-CM

## 2014-03-26 LAB — URINE CULTURE
COLONY COUNT: NO GROWTH
CULTURE: NO GROWTH

## 2014-03-26 LAB — BASIC METABOLIC PANEL
Anion gap: 8 (ref 5–15)
BUN: 7 mg/dL (ref 6–23)
CO2: 22 mmol/L (ref 19–32)
CREATININE: 0.81 mg/dL (ref 0.50–1.10)
Calcium: 8.7 mg/dL (ref 8.4–10.5)
Chloride: 109 mEq/L (ref 96–112)
Glucose, Bld: 108 mg/dL — ABNORMAL HIGH (ref 70–99)
Potassium: 4 mmol/L (ref 3.5–5.1)
Sodium: 139 mmol/L (ref 135–145)

## 2014-03-26 LAB — CBC
HEMATOCRIT: 31.1 % — AB (ref 36.0–46.0)
Hemoglobin: 9.6 g/dL — ABNORMAL LOW (ref 12.0–15.0)
MCH: 24.8 pg — ABNORMAL LOW (ref 26.0–34.0)
MCHC: 30.9 g/dL (ref 30.0–36.0)
MCV: 80.4 fL (ref 78.0–100.0)
PLATELETS: 326 10*3/uL (ref 150–400)
RBC: 3.87 MIL/uL (ref 3.87–5.11)
RDW: 16.4 % — ABNORMAL HIGH (ref 11.5–15.5)
WBC: 6.4 10*3/uL (ref 4.0–10.5)

## 2014-03-26 MED ORDER — THIAMINE HCL 100 MG PO TABS: 100.0000 mg | ORAL_TABLET | Freq: Every day | ORAL | Status: DC

## 2014-03-26 MED ORDER — VITAMIN B-6 50 MG PO TABS
50.0000 mg | ORAL_TABLET | Freq: Every day | ORAL | Status: DC
  Filled 2014-03-26: qty 1

## 2014-03-26 MED ORDER — FOLIC ACID 1 MG PO TABS: 1.0000 mg | ORAL_TABLET | Freq: Every day | ORAL | Status: DC

## 2014-03-26 MED ORDER — HEPARIN SODIUM (PORCINE) 5000 UNIT/ML IJ SOLN: 5000.0000 [IU] | Freq: Three times a day (TID) | INTRAMUSCULAR | Status: DC

## 2014-03-26 MED ORDER — VITAMIN B-6 50 MG PO TABS: 50.0000 mg | ORAL_TABLET | Freq: Every day | ORAL | Status: DC

## 2014-03-26 MED ORDER — FERROUS SULFATE 325 (65 FE) MG PO TABS
325.0000 mg | ORAL_TABLET | Freq: Three times a day (TID) | ORAL | Status: DC
Start: 2014-03-26 — End: 2014-03-26
  Filled 2014-03-26 (×2): qty 1

## 2014-03-26 MED ORDER — VALACYCLOVIR HCL 500 MG PO TABS
500.0000 mg | ORAL_TABLET | Freq: Every day | ORAL | Status: DC
  Filled 2014-03-26: qty 1

## 2014-03-26 NOTE — Procedures (Signed)
History: 34 yo F with new onset seizures in the setting of cocaine use.   Sedation: Clonazepam  Technique: This is a 17 channel routine scalp EEG performed at the bedside with bipolar and monopolar montages arranged in accordance to the international 10/20 system of electrode placement. One channel was dedicated to EKG recording.    Background: The patient is drowsy or sleeping thorughout most of the recording, but during a brief period of wakefullness, the background consists of intermixed alpha and beta frequencies.There is a well defined posterior dominant rhythm of 10 Hz that attenuates with eye opening. Sleep structures are symmetric and normal in appearance.   Photic stimulation: Physiologic driving is not performed  EEG Abnormalities: none  Clinical Interpretation: This normal EEG is recorded in the waking and sleep state. There was no seizure or seizure predisposition recorded on this study.   Ritta Slot, MD [facility] [phone]  If 7pm- 7am, please page neurology on call as listed in AMION.

## 2014-03-26 NOTE — Progress Notes (Signed)
Subjective: No overnight events.  History: Judy [name] is an 34 y.o. female with a past medical history significant for depression, anxiety, and polysubstance abuse, transferred to [facility] for further evaluation of seizures. She initially presented to [facility] via EMS for evaluation of witnessed GTC seizures. As per EMS report, patient was using crack cocaine for 4 days before her seizures. Seizures were aborted by EMS with IV midazolam but upon arrival to [facility]ED she was intubated for airway protection. She subsequently became agitated despite propofol and fentalny, extubated herself, and had to be re intubated. No further seizures reported. CT brain done at [facility] showed no acute intracranial abnormality. UDS was positive for cocaine and benzodiazepines. Presently, she open her eyes to verbal commands but gets angry and said that she feels cold, has pain in her arms, and then falls back to sleep.  Objective: Current vital signs: BP 110/65 mmHg  Pulse 66  Temp(Src) 98.9 F (37.2 C) (Oral)  Resp 19  Ht 5\' 3"  (1.6 m)  Wt 55 kg (121 lb 4.1 oz)  BMI 21.48 kg/m2  SpO2 99%  LMP 05/20/2013 Vital signs in last 24 hours: Temp:  [98 F (36.7 C)-98.9 F (37.2 C)] 98.9 F (37.2 C) (01/04 0746) Pulse Rate:  [62-92] 66 (01/04 0400) Resp:  [10-25] 19 (01/04 0600) BP: (96-116)/(50-73) 110/65 mmHg (01/04 0600) SpO2:  [98 %-100 %] 99 % (01/04 0400) Weight:  [55 kg (121 lb 4.1 oz)] 55 kg (121 lb 4.1 oz) (01/04 0500)  Intake/Output from previous day: 01/03 0701 - 01/04 0700 In: 3240 [P.O.:1940; I.V.:1200] Out: 1053 [Urine:1050; Emesis/NG output:3] Intake/Output this shift:   Nutritional status: Diet regular  Neurologic Exam: General: Mental Status: Somnolent but at times uncooperative. Speech fluent without evidence of aphasia. Able to follow simple step commands inconsistently. Cranial Nerves: II: Discs flat bilaterally; Visual fields grossly normal, pupils equal, round, reactive to light and  accommodation III,IV, VI: ptosis not present, extra-ocular motions intact bilaterally V,VII: smile symmetric, facial light touch sensation normal bilaterally VIII: hearing normal bilaterally Motor: Moves all limbs on all limbs. Tone and bulk:normal tone throughout; no atrophy noted Sensory: reacts to noxious stimuli Plantars: Right: downgoingLeft: downgoing Cerebellar: Unable to test Gait:  Unable to test at this moment.  Lab Results: Basic Metabolic Panel:  Recent Labs Lab 03/25/14 0744 03/26/14 0249  NA 139 139  K 3.7 4.0  CL 112 109  CO2 21 22  GLUCOSE 74 108*  BUN <5* 7  CREATININE 0.82 0.81  CALCIUM 8.3* 8.7  MG 1.8  --     Liver Function Tests:  Recent Labs Lab 03/25/14 0744  AST 24  ALT 14  ALKPHOS 46  BILITOT 0.4  PROT 5.4*  ALBUMIN 2.7*   No results for input(s): LIPASE, AMYLASE in the last 168 hours. No results for input(s): AMMONIA in the last 168 hours.  CBC:  Recent Labs Lab 03/25/14 0744 03/26/14 0249  WBC 6.6 6.4  HGB 8.5* 9.6*  HCT 27.0* 31.1*  MCV 79.4 80.4  PLT 314 326    Cardiac Enzymes: No results for input(s): CKTOTAL, CKMB, CKMBINDEX, TROPONINI in the last 168 hours.  Lipid Panel: No results for input(s): CHOL, TRIG, HDL, CHOLHDL, VLDL, LDLCALC in the last 168 hours.  CBG: No results for input(s): GLUCAP in the last 168 hours.  Microbiology: Results for orders placed or performed during the hospital encounter of 03/25/14  MRSA PCR Screening     Status: None   Collection Time: 03/25/14  6:35 AM  Result Value Ref Range Status   MRSA by PCR NEGATIVE NEGATIVE Final    Comment:        The GeneXpert MRSA Assay (FDA approved for NASAL specimens only), is one component of a comprehensive MRSA colonization surveillance program. It is not intended to diagnose MRSA infection nor to guide or monitor treatment for MRSA infections.     Coagulation Studies: No results for input(s):  LABPROT, INR in the last 72 hours.  Imaging: Ct Head Wo Contrast  03/25/2014   CLINICAL DATA:  Seizure. Facial bone fracture is seen on head CT of March 25, 2014  EXAM: CT HEAD WITHOUT CONTRAST  CT MAXILLOFACIAL WITHOUT CONTRAST  TECHNIQUE: Multidetector CT imaging of the head and maxillofacial structures were performed using the standard protocol without intravenous contrast. Multiplanar CT image reconstructions of the maxillofacial structures were also generated.  COMPARISON:  Head CT March 25, 2014 1:07 a.m., May 13, 2013  FINDINGS: CT HEAD FINDINGS  There is no midline shift, hydrocephalus, or mass. No acute hemorrhage or acute transcortical infarct is identified. The bony calvarium is intact. Mucoperiosteal thickening of bilateral ethmoid, right frontal and right sphenoid sinuses are identified. There is comminuted depressed fracture of the right zygomatic arch. There is chronic deformity of the left zygomatic arch unchanged compared to prior CT of May 13, 2013.  CT MAXILLOFACIAL FINDINGS  There is comminuted depressed fracture of the right zygomatic arch. No other acute fracture or dislocation is identified in the maxillofacial bones. There is chronic deformity of the left zygomatic marked which unchanged compared to prior CT of February 2015. There are is mucoperiosteal thickening of the bilateral ethmoid, right frontal and right sphenoid sinuses. There is chronic nasal septal deviation of right to left unchanged.  IMPRESSION: No focal acute intracranial abnormality is identified. There is a depressed comminuted fracture of the right zygomatic arch. No other acute fracture or dislocation is identified within the maxillofacial bones.   Electronically Signed   By: Sherian Rein M.D.   On: 03/25/2014 10:14   Dg Chest Port 1 View  03/25/2014   CLINICAL DATA:  Shortness of breath.  EXAM: PORTABLE CHEST - 1 VIEW  COMPARISON:  Earlier today at 0026 hr.  FINDINGS: Support apparatus: Extubation.   Cardiomediastinal silhouette: Normal heart size.  Pleura:  No pleural effusion or pneumothorax.  Lungs: Clear  Other: None  IMPRESSION: Interval extubation.  No acute cardiopulmonary disease.   Electronically Signed   By: Jeronimo Greaves M.D.   On: 03/25/2014 09:06   Ct Maxillofacial Wo Cm  03/25/2014   CLINICAL DATA:  Seizure. Facial bone fracture is seen on head CT of March 25, 2014  EXAM: CT HEAD WITHOUT CONTRAST  CT MAXILLOFACIAL WITHOUT CONTRAST  TECHNIQUE: Multidetector CT imaging of the head and maxillofacial structures were performed using the standard protocol without intravenous contrast. Multiplanar CT image reconstructions of the maxillofacial structures were also generated.  COMPARISON:  Head CT March 25, 2014 1:07 a.m., May 13, 2013  FINDINGS: CT HEAD FINDINGS  There is no midline shift, hydrocephalus, or mass. No acute hemorrhage or acute transcortical infarct is identified. The bony calvarium is intact. Mucoperiosteal thickening of bilateral ethmoid, right frontal and right sphenoid sinuses are identified. There is comminuted depressed fracture of the right zygomatic arch. There is chronic deformity of the left zygomatic arch unchanged compared to prior CT of May 13, 2013.  CT MAXILLOFACIAL FINDINGS  There is comminuted depressed fracture of the right zygomatic arch. No other acute fracture  or dislocation is identified in the maxillofacial bones. There is chronic deformity of the left zygomatic marked which unchanged compared to prior CT of February 2015. There are is mucoperiosteal thickening of the bilateral ethmoid, right frontal and right sphenoid sinuses. There is chronic nasal septal deviation of right to left unchanged.  IMPRESSION: No focal acute intracranial abnormality is identified. There is a depressed comminuted fracture of the right zygomatic arch. No other acute fracture or dislocation is identified within the maxillofacial bones.   Electronically Signed   By: Sherian Rein  M.D.   On: 03/25/2014 10:14    Medications:  Scheduled: . clonazePAM  1 mg Oral QHS  . FLUoxetine  20 mg Oral Daily  . folic acid  1 mg Oral Daily  . LORazepam  0-4 mg Intravenous Q6H   Followed by  . [START ON 03/27/2014] LORazepam  0-4 mg Intravenous Q12H  . multivitamin with minerals  1 tablet Oral Daily  . sodium chloride  3 mL Intravenous Q12H  . thiamine  100 mg Oral Daily   Or  . thiamine  100 mg Intravenous Daily    Assessment/Plan: 34 y/o with history depression and polysubstance abuse now with new onset seizures, non focal exam, unremarkable CT brain, and urine positive for cocaine. Suspect that her event likely represents a provoked seizure due to cocaine usage.   -check EEG -hold on AED pending results of EEG -if EEG unremarkable no further neurological workup indicated -will need outpatient neurology follow up. No driving, operating heavy machinery until outpatient follow up    LOS: 1 day   Elspeth Cho, DO [facility] [phone]  If 7pm- 7am, please page neurology on call as listed in AMION. 03/26/2014  9:02 AM

## 2014-03-26 NOTE — Progress Notes (Signed)
EEG completed; results pending.    

## 2014-03-26 NOTE — Progress Notes (Signed)
[place] TEAM 1 - Stepdown/ICU TEAM Progress Note  Judy [name] ZHY:[id number] DOB: 1981-03-18 DOA: 03/25/2014 PCP: No PCP Per Patient  Admit HPI / Brief Narrative: 34 y.o. female with history of alcohol, cocaine and benzodiazepine abuse along with narcotic abuse, C-section few months ago, and depression.  EMS was called for seizure-like activity.  She was taken to [facility] ER where she was intubated for airway protection, but she extubated herself. She was thereafter transferred to [facility] for further management. Critical care was consulted over the phone prior to transfer and they recommended a Hospitalist admission.  At [facility] ER her urine drug screen was positive for cocaine along with benzodiazepines.  Alcohol level was 13.  Reportedly head CT was stable. UA was unremarkable / urine pregnancy was negative. There was question of alcohol withdrawal versus benzo withdrawal seizure.  HPI/Subjective: Pt is awake/alert, but interacts very little w/ the MD.  When questioned she denies specific complaints.    Assessment/Plan:  Seizure Neurology following - to have EEG today - possibly due to substance withdrawal  Polysubstance abuse Pt has been counseled on the absolute need to abstain from all substances of abuse - she is not willing to engage in conversation however  Depressed zygomatic arch fracture on the right side ENT on call Dr. Jenne Pane - will see in the office  History of depression Cont usual home meds   Code Status: FULL Family Communication: no family present at time of exam Disposition Plan: transfer to neuro floor bed  Consultants: Neurology  Procedures: EEG - pending 1/4  Antibiotics: none  DVT prophylaxis: Sub Q heparin   Objective: Blood pressure 108/52, pulse 66, temperature 98.9 F (37.2 C), temperature source Oral, resp. rate 14, height  (1.6 m), weight 55 kg (121 lb 4.1 oz), last menstrual period 05/20/2013, SpO2 99 %.  Intake/Output  Summary (Last 24 hours) at 03/26/14 1015 Last data filed at 03/26/14 0500  Gross per 24 hour  Intake   3015 ml  Output    803 ml  Net   2212 ml   Exam: General: No acute respiratory distress - will not fully engage in conversation Lungs: Clear to auscultation bilaterally without wheezes or crackles Cardiovascular: Regular rate and rhythm without murmur gallop or rub normal S1 and S2 Abdomen: Nontender, nondistended, soft, bowel sounds positive, no rebound, no ascites, no appreciable mass Extremities: No significant cyanosis, clubbing, or edema bilateral lower extremities  Data Reviewed: Basic Metabolic Panel:  Recent Labs Lab 03/25/14 0744 03/26/14 0249  NA 139 139  K 3.7 4.0  CL 112 109  CO2 21 22  GLUCOSE 74 108*  BUN <5* 7  CREATININE 0.82 0.81  CALCIUM 8.3* 8.7  MG 1.8  --     Liver Function Tests:  Recent Labs Lab 03/25/14 0744  AST 24  ALT 14  ALKPHOS 46  BILITOT 0.4  PROT 5.4*  ALBUMIN 2.7*   CBC:  Recent Labs Lab 03/25/14 0744 03/26/14 0249  WBC 6.6 6.4  HGB 8.5* 9.6*  HCT 27.0* 31.1*  MCV 79.4 80.4  PLT 314 326    Recent Results (from the past 240 hour(s))  MRSA PCR Screening     Status: None   Collection Time: 03/25/14  6:35 AM  Result Value Ref Range Status   MRSA by PCR NEGATIVE NEGATIVE Final    Comment:        The GeneXpert MRSA Assay (FDA approved for NASAL specimens only), is one component of a comprehensive  MRSA colonization surveillance program. It is not intended to diagnose MRSA infection nor to guide or monitor treatment for MRSA infections.      Studies:  Recent x-ray studies have been reviewed in detail by the Attending Physician  Scheduled Meds:  Scheduled Meds: . clonazePAM  1 mg Oral QHS  . FLUoxetine  20 mg Oral Daily  . folic acid  1 mg Oral Daily  . LORazepam  0-4 mg Intravenous Q6H   Followed by  . [START ON 03/27/2014] LORazepam  0-4 mg Intravenous Q12H  . multivitamin with minerals  1 tablet Oral  Daily  . sodium chloride  3 mL Intravenous Q12H  . thiamine  100 mg Oral Daily   Or  . thiamine  100 mg Intravenous Daily    Time spent on care of this patient: 35 mins   Odai Wimmer T , MD   [facility] Office  [phone] Pager - Text Page per Loretha Stapler as per below:  On-Call/Text Page:      [email or web address]      password [email or web address]  If 7PM-7AM, please contact night-coverage [email or web address] Password [email or web address] 03/26/2014, 10:15 AM   LOS: 1 day

## 2014-03-26 NOTE — Progress Notes (Signed)
Pt standing in room removing tele wires and other monitor equipment. Stating "I'm ready to go, I need clothes, my ride will be here around 5-6" Pt advised about leaving against medical advise. Pt stated she understands risk and potential of death or further illness and still requests to go, AMA paperwork given and signed, IV removed and social worker attempting to get clothes vs paper scrubs due to cold weather.

## 2014-04-04 NOTE — Discharge Summary (Signed)
  PT LEFT AMA SUMMARY  Evert KohlRenisha S Billey  MR#: [id number]  DOB:[birth year]  Date of Admission: 03/25/2014 Date LEFT AMA: 03/26/2014  Attending Physician:Omarrion Carmer T  Patient's PCP:No PCP Per Patient  Disposition: LEFT AMA  Follow-up Appts:  Not able to be arranged or discussed as pt LEFT AMA  Diagnoses at time pt LEFT AMA: Seizure Polysubstance abuse Depressed zygomatic arch fracture on the right side  History of depression Cont usual home meds   Initial presentation: 34 y.o. female with history of alcohol, cocaine and benzodiazepine abuse along with narcotic abuse, C-section few months ago, and depression. EMS was called for seizure-like activity. She was taken to [facility] where she was intubated for airway protection, but she extubated herself. She was thereafter transferred to [facility] for further management. Critical care was consulted over the phone prior to transfer and they recommended a Hospitalist admission.  At [facility] ER her urine drug screen was positive for cocaine along with benzodiazepines. Alcohol level was 13. Reportedly head CT was stable. UA was unremarkable / urine pregnancy was negative. There was question of alcohol withdrawal versus benzo withdrawal seizure.  Hospital Course: Listed below are the active problems present, and the status of the care of these problems, at the time the pt decided to LEAVE AMA:  Seizure Neurology following - to have EEG today - possibly due to substance withdrawal  Polysubstance abuse Pt has been counseled on the absolute need to abstain from all substances of abuse - she is not willing to engage in conversation however  Depressed zygomatic arch fracture on the right side ENT on call Dr. Jenne PaneBates - will see in the office  History of depression Cont usual home meds     Medication List    UNABLE TO BE FINALIZED/DISCUSSED AS PT LEFT AMA  Day of Discharge BP 103/57 mmHg  Pulse 66  Temp(Src) 98.8 F (37.1 C)  (Oral)  Resp 17  Ht 5\' 3"  (1.6 m)  Wt 55 kg (121 lb 4.1 oz)  BMI 21.48 kg/m2  SpO2 99%  LMP 05/20/2013  Physical Exam: Exam not able to be completed at time of d/c as pt LEFT AMA   04/04/2014, 7:02 PM   Lonia BloodJeffrey T. Sharia Averitt, MD [facility] Office  [phone] Pager [phone]  On-Call/Text Page:      [email or web address]      password [email or web address]

## 2014-04-18 ENCOUNTER — Encounter (HOSPITAL_COMMUNITY): Payer: Self-pay

## 2014-04-26 ENCOUNTER — Encounter (HOSPITAL_COMMUNITY): Payer: Self-pay | Admitting: *Deleted

## 2014-04-26 ENCOUNTER — Emergency Department (HOSPITAL_COMMUNITY)
Admission: EM | Admit: 2014-04-26 | Discharge: 2014-04-29 | Disposition: A | Discharge status: Other Acute Inpatient Hospital | Payer: Medicaid Other | Attending: Emergency Medicine | Admitting: Emergency Medicine

## 2014-04-26 DIAGNOSIS — R45851 Suicidal ideations: Secondary | ICD-10-CM

## 2014-04-26 DIAGNOSIS — F1994 Other psychoactive substance use, unspecified with psychoactive substance-induced mood disorder: Secondary | ICD-10-CM

## 2014-04-26 LAB — CBC WITH DIFFERENTIAL/PLATELET
BASOS PCT: 1 % (ref 0–1)
Basophils Absolute: 0 10*3/uL (ref 0.0–0.1)
EOS PCT: 7 % — AB (ref 0–5)
Eosinophils Absolute: 0.3 10*3/uL (ref 0.0–0.7)
HEMATOCRIT: 34.8 % — AB (ref 36.0–46.0)
HEMOGLOBIN: 11.1 g/dL — AB (ref 12.0–15.0)
LYMPHS PCT: 52 % — AB (ref 12–46)
Lymphs Abs: 2.3 10*3/uL (ref 0.7–4.0)
MCH: 24.4 pg — ABNORMAL LOW (ref 26.0–34.0)
MCHC: 31.9 g/dL (ref 30.0–36.0)
MCV: 76.5 fL — AB (ref 78.0–100.0)
MONO ABS: 0.4 10*3/uL (ref 0.1–1.0)
Monocytes Relative: 8 % (ref 3–12)
Neutro Abs: 1.4 10*3/uL — ABNORMAL LOW (ref 1.7–7.7)
Neutrophils Relative %: 32 % — ABNORMAL LOW (ref 43–77)
PLATELETS: 296 10*3/uL (ref 150–400)
RBC: 4.55 MIL/uL (ref 3.87–5.11)
RDW: 16 % — ABNORMAL HIGH (ref 11.5–15.5)
WBC: 4.4 10*3/uL (ref 4.0–10.5)

## 2014-04-26 LAB — COMPREHENSIVE METABOLIC PANEL
ALBUMIN: 4.3 g/dL (ref 3.5–5.2)
ALT: 26 U/L (ref 0–35)
AST: 38 U/L — ABNORMAL HIGH (ref 0–37)
Alkaline Phosphatase: 72 U/L (ref 39–117)
Anion gap: 11 (ref 5–15)
BUN: 7 mg/dL (ref 6–23)
CALCIUM: 9.9 mg/dL (ref 8.4–10.5)
CO2: 25 mmol/L (ref 19–32)
Chloride: 107 mmol/L (ref 96–112)
Creatinine, Ser: 1.01 mg/dL (ref 0.50–1.10)
GFR calc non Af Amer: 72 mL/min — ABNORMAL LOW (ref >90)
GFR, EST AFRICAN AMERICAN: 83 mL/min — AB (ref >90)
GLUCOSE: 78 mg/dL (ref 70–99)
POTASSIUM: 3.6 mmol/L (ref 3.5–5.1)
Sodium: 143 mmol/L (ref 135–145)
Total Bilirubin: 0.4 mg/dL (ref 0.3–1.2)
Total Protein: 7.6 g/dL (ref 6.0–8.3)

## 2014-04-26 LAB — ACETAMINOPHEN LEVEL: Acetaminophen (Tylenol), Serum: <10 ug/mL — ABNORMAL LOW (ref 10–30)

## 2014-04-26 LAB — RAPID URINE DRUG SCREEN, HOSP PERFORMED
Amphetamines: NOT DETECTED
BARBITURATES: NOT DETECTED
BENZODIAZEPINES: NOT DETECTED
COCAINE: POSITIVE — AB
Opiates: NOT DETECTED
Tetrahydrocannabinol: NOT DETECTED

## 2014-04-26 LAB — ETHANOL: ALCOHOL ETHYL (B): 34 mg/dL — AB (ref 0–9)

## 2014-04-26 LAB — SALICYLATE LEVEL: Salicylate Lvl: <4 mg/dL (ref 2.8–20.0)

## 2014-04-26 MED ORDER — ONDANSETRON HCL 4 MG/2ML IJ SOLN
4.0000 mg | Freq: Once | INTRAMUSCULAR | Status: AC
Start: 2014-04-26 — End: 2014-04-26
  Administered 2014-04-26: 4 mg via INTRAVENOUS
  Filled 2014-04-26: qty 2

## 2014-04-26 MED ORDER — DIPHENHYDRAMINE HCL 50 MG/ML IJ SOLN
25.0000 mg | Freq: Once | INTRAMUSCULAR | Status: AC
  Administered 2014-04-26: 25 mg via INTRAVENOUS
  Filled 2014-04-26: qty 1

## 2014-04-26 MED ORDER — METOCLOPRAMIDE HCL 5 MG/ML IJ SOLN
10.0000 mg | Freq: Once | INTRAMUSCULAR | Status: DC
  Filled 2014-04-26: qty 2
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
## 2014-04-26 NOTE — Progress Notes (Signed)
CSW faxed patient referral to the following inpatient facilities:  [address] [facility], [facility], [facility], [facility], [facility], [facility], [facility].  [place] Abtsatia Matthew Cina, LCSWA Disposition staff 04/26/2014 6:55 PM

## 2014-04-26 NOTE — BH Assessment (Signed)
[facility] Assessment Progress Note  Spoke with Dr. Essie ChristineBalleh and took history of pt, ED staff will take tele assessment machine in the room.

## 2014-04-26 NOTE — ED Notes (Signed)
Pt had 2 episodes of emesis - approx .  EDP/ED Resident both aware.

## 2014-04-26 NOTE — ED Notes (Addendum)
Patient out to desk and on telephone x5 minutes. Sitter remains with patient. Pt requesting "Something for withdrawal" - pt ambulating with steady gait. Dr. Essie ChristineBalleh and Dr. Preston FleetingGlick aware, state to take no further action at this time.

## 2014-04-26 NOTE — ED Notes (Signed)
Pt rang call bell and stated "I'm going through withdrawal and I am shaking all over".  Able to view patient on video monitor and she was sitting still with sitter at bedside. When this RN walked into room, patient began to shake her arms and body saying "I'm in withdrawals and I need something for it."  Spoke with patient regarding her symptoms and she stopped shaking her body.  Patient sitting calmly in bed at this time and in NAD.

## 2014-04-26 NOTE — ED Notes (Signed)
Patient comes to ED crying leaning on the counter.  Offered patient a wheelchair, she refused.  Patient then put herself in the floor.  She was laying on her back.  Patient with eye fluttering.  She then cries out stating she is going to kill herself and needs help.  Patient refused lsb to assist to the bed.  Patient states she has a baby, she is doing crack, cocaine, alcohol, and oxy.  Patient then began undressing.  She refused to put her top back on.  Patient given blankets.  Patient then began to hit her head on the wall.  She is now hitting herself in the head.  Patient keeps stating she hates herself.  Her baby father does not want her anymore.  Patient continues to cry, difficult to redirect.
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
## 2014-04-27 DIAGNOSIS — R45851 Suicidal ideations: Secondary | ICD-10-CM | POA: Insufficient documentation

## 2014-04-27 DIAGNOSIS — F1994 Other psychoactive substance use, unspecified with psychoactive substance-induced mood disorder: Secondary | ICD-10-CM | POA: Insufficient documentation

## 2014-04-27 DIAGNOSIS — F1010 Alcohol abuse, uncomplicated: Secondary | ICD-10-CM

## 2014-04-27 DIAGNOSIS — R45850 Homicidal ideations: Secondary | ICD-10-CM

## 2014-04-27 LAB — PREGNANCY, URINE: PREG TEST UR: NEGATIVE

## 2014-04-27 MED ORDER — FLUOXETINE HCL 20 MG PO CAPS
20.0000 mg | ORAL_CAPSULE | Freq: Every day | ORAL | Status: DC
  Administered 2014-04-27 – 2014-04-29 (×3): 20 mg via ORAL
  Filled 2014-04-27 (×3): qty 1

## 2014-04-27 MED ORDER — QUETIAPINE FUMARATE 25 MG PO TABS
25.0000 mg | ORAL_TABLET | Freq: Two times a day (BID) | ORAL | Status: DC | PRN
  Administered 2014-04-27 – 2014-04-29 (×4): 25 mg via ORAL
  Filled 2014-04-27 (×4): qty 1

## 2014-04-27 MED ORDER — PROMETHAZINE HCL 25 MG PO TABS
25.0000 mg | ORAL_TABLET | Freq: Once | ORAL | Status: AC
  Administered 2014-04-27: 25 mg via ORAL
  Filled 2014-04-27: qty 1

## 2014-04-27 MED ORDER — PANTOPRAZOLE SODIUM 40 MG PO TBEC
40.0000 mg | DELAYED_RELEASE_TABLET | Freq: Every day | ORAL | Status: DC
  Administered 2014-04-27 – 2014-04-29 (×3): 40 mg via ORAL
  Filled 2014-04-27 (×3): qty 1

## 2014-04-27 MED ORDER — FERROUS SULFATE 325 (65 FE) MG PO TABS
325.0000 mg | ORAL_TABLET | Freq: Three times a day (TID) | ORAL | Status: DC
  Administered 2014-04-28: 325 mg via ORAL
  Filled 2014-04-27 (×11): qty 1

## 2014-04-27 MED ORDER — ONDANSETRON 4 MG PO TBDP
8.0000 mg | ORAL_TABLET | Freq: Three times a day (TID) | ORAL | Status: DC | PRN
  Administered 2014-04-28 – 2014-04-29 (×2): 8 mg via ORAL
  Filled 2014-04-27 (×3): qty 2

## 2014-04-27 MED ORDER — PROMETHAZINE HCL 25 MG/ML IJ SOLN: 25.0000 mg | Freq: Once | INTRAMUSCULAR | Status: DC
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
## 2014-04-27 NOTE — ED Notes (Addendum)
Pt is a 34 year old voluntary [facility]. Pt presents asking for lots of graham crackers, peanut butter, a Malawiturkey sandwich and the remote. She stated,"I have been doing drug since I was 17 and I am coming off them now and really hungry."Pt said, "I need to get clean." The only time she has been clean was in 2009. Pt admits she had a baby boy 3 months ago and he weighed only 3 pds due to her drug usage. She does appear very anxious and presently is on the phone talking to someone. She has no place to go upon discharge and is hopeful social work can help her. Pt is allergic to : Sulfa, zofran and cephalosporins. Her medical hx includes: anemia, depression, cocaine and ETOH abuse. Pt stated,"I use $1000.00 /day of cocaine and alcohol.When asked where she gets that kind of money the pts response was,"from SSI back pay."Pt does contract for safety and denies SI and HI. She was shown to her room and given food and drink.Pt would like to try to get her baby back. [facility].Pt remains very intrusive and needy. Pt told the nurse she had not eaten in over two weeks due to being on a drug and alcohol binge. 5:20pm-Pt stated,"I keep hearing the devils voice telling me to leave cause I got paid today and need to go do more drugs." Pt also stated,"The devil tells me to jump off a bridge." When the pt was asked which [place] she responded, [place]Pt does contract for safety. 5:30pm-Pt was given 25mg  of seroquel for her anxiety. She refused her iron pill stating, that makes me sick."

## 2014-04-27 NOTE — ED Notes (Signed)
Family at bedside. 

## 2014-04-27 NOTE — ED Notes (Signed)
[facility] called to inform us that they are a capacity, no available beds on any unit.

## 2014-04-27 NOTE — ED Notes (Signed)
Went into give pt phengen PO for nausea and pt is asleep

## 2014-04-27 NOTE — ED Notes (Signed)
[facility]. SHE ADVISES IS CLEAR TO [facility].

## 2014-04-27 NOTE — ED Notes (Signed)
Pt assisted to bathroom with sitter to change into scrubs.

## 2014-04-27 NOTE — ED Notes (Signed)
Pt continuing to call out and request food even after the rules were reviewed with her. Pt states she feels like she should be able to eat whenever she wants to because she consumed $1000 worth of cocaine and it makes you very hungry. Pt was advised of the rules once again. Pt upset.

## 2014-04-27 NOTE — ED Notes (Signed)
Patient upset that her lunch has not arrived. Called service response and kitchen. They cannot explain why the other patients have their meals and pt does not. The kitchen advises they will send someone with the tray when they get someone available

## 2014-04-27 NOTE — ED Notes (Signed)
Pt calling out numerous times requesting food, coffee and update as to when she will get a bed. Reviewed with pt the rules about when food and snacks will be handed out. Offered pt water and explained to pt breakfast comes at 7am. She can eat then. Pt upset.

## 2014-04-27 NOTE — ED Notes (Signed)
[facility] Pt. Alert and oriented in no distress, denies HI, VH and pain.  Pt. Does state she has SI to jump off a bridge and that she hears voices "telling her to leave". Pt. Instructed to come to me with problems or concerns.Will continue to monitor for safety via security cameras and Q 15 minute checks.

## 2014-04-27 NOTE — ED Notes (Addendum)
Patient ambulated to restroom and tolerated well.  Patient made phone call at desk.

## 2014-04-28 NOTE — BH Assessment (Signed)
[facility]. Contacted the following facilities for placement:  INFORMATION ALREADY FAXED, AWAITING RESPONSE: [facility]  AT CAPACITY: [facility], per [facility], [facility], per [facility], per [facility], per [facility], per [facility]  NO RESPONSE: [facility]  PT DECLINED: [facility]   [address] Ellis Patsy BaltimoreWarrick Jr, WisconsinLPC, [facility] Triage Specialist [phone]

## 2014-04-28 NOTE — ED Notes (Signed)
Dr saranga and josephine np into see 

## 2014-04-28 NOTE — ED Notes (Signed)
[facility]. Pt. Alert and oriented in no distress denies SI, HI, AVH and pain.  Pt. Instructed to come to me with problems or concerns.Will continue to monitor for safety via security cameras and Q 15 minute checks.

## 2014-04-28 NOTE — ED Notes (Signed)
Josephine NP has been back into talk w/ pt and patient has agreed to stay.

## 2014-04-28 NOTE — ED Notes (Signed)
Pt has decided that she wants to go home.  Pt denies si/hi/avh at this time, and reports that she told them that to get treatment.  Pt is aware that they are trying to place her for admission and was encouraged to stay for treatment.  Pt again requested to go home.  TTS aware and will relay to [facility]

## 2014-04-28 NOTE — Progress Notes (Signed)
CSW continued inpatient placement for patient:  [facility]  [facility]  Declined: [facility] due [facility] [place] [facility]  Adelene AmasEdith Anyssa Sharpless, LCSW Disposition Social Worker [phone]

## 2014-04-28 NOTE — ED Notes (Signed)
Pt reports that paxil works better for her than prozac, will relay info to NP/MD

## 2014-04-28 NOTE — ED Notes (Signed)
Graham crackers and ice water given
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
## 2014-04-28 NOTE — ED Notes (Signed)
.  .  .        xx

## 2014-04-28 NOTE — ED Notes (Signed)
PT vomited after taking iron pill;  Pt reports that it is not unusual for her to get sick after taking them, and that she has used prenatal chewables w/o vomiting.

## 2014-04-29 MED ORDER — PROMETHAZINE HCL 25 MG PO TABS
25.0000 mg | ORAL_TABLET | Freq: Four times a day (QID) | ORAL | Status: DC | PRN
  Administered 2014-04-29: 25 mg via ORAL
  Filled 2014-04-29: qty 1

## 2014-04-29 NOTE — ED Notes (Addendum)
Written dc instructions reviewed w/ pt. Pt encouraged to follow up w/ treatment programs.  Pt encouraged to return/seek treatment for suicidal/homicidal thoughts or urges.  Pt verbalized understanding.  Pt ambulatory w/o difficulty to dc window w/ mHt.  Belongings returned after leaving the unit. Friend is coming to drive pt home.

## 2014-04-29 NOTE — BH Assessment (Signed)
[facility] Assessment Progress Note      Spoke to Tiffany, pt's EDPA-C regarding receipt of TTS consult.  She reports the patient came in with multiple medical complaints, which were cleared, but also reported that perhaps they were connected to his depression, which has been worsening for months.  He reports [facility].  He states he has severe anhedonia and hasn't really been eating or drinking, which Tiffany reports labs corroborate.  Attempted to connect with nursing staff to set up telepsych.  Call rerouted multiple times.  RN on other line.  Will continue to attempt to schedule assessment.

## 2014-04-29 NOTE — ED Notes (Signed)
Dr saranga and josephine np into see 

## 2014-04-29 NOTE — ED Notes (Signed)
Pt reports  Nausea has resolved, eating saltines

## 2014-04-29 NOTE — ED Notes (Signed)
Pt. C/o nausea. 

## 2014-04-29 NOTE — [facility] Suicide Risk Assessment (Cosign Needed)
Suicide Risk Assessment  Discharge Assessment   [facility]   Demographic Factors:  Low socioeconomic status and Unemployed  Total Time spent with patient: 20 minutes  Musculoskeletal: Strength & Muscle Tone: within normal limits Gait & Station: normal Patient leans: N/A  Psychiatric Specialty Exam:     Blood pressure 99/60, pulse 75, temperature 98.2 F (36.8 C), temperature source Oral, resp. rate 18, last menstrual period 05/20/2013, SpO2 100 %.There is no weight on file to calculate BMI.  General Appearance: Casual  Eye Contact::  Good  Speech:  Clear and Coherent and Normal Rate409  Volume:  Normal  Mood:  Euthymic  Affect:  Congruent  Thought Process:  Coherent and Goal Directed  Orientation:  Full (Time, Place, and Person)  Thought Content:  WDL  Suicidal Thoughts:  No  Homicidal Thoughts:  No  Memory:  Immediate;   Good Recent;   Fair Remote;   Fair  Judgement:  Fair  Insight:  Fair  Psychomotor Activity:  Normal  Concentration:  Good  Recall:  NA  Fund of Knowledge:Fair  Language: Good  Akathisia:  NA  Handed:  Right  AIMS (if indicated):     Assets:  Desire for Improvement  Sleep:     Cognition: WNL  ADL's:  Intact      Has this patient used any form of tobacco in the last 30 days? (Cigarettes, Smokeless Tobacco, Cigars, and/or Pipes) Yes, A prescription for an FDA-approved tobacco cessation medication was offered at discharge and the patient refused  Mental Status Per Nursing Assessment::   On Admission:     Current Mental Status by Physician: NA  Loss Factors: NA  Historical Factors: Prior suicide attempts  Risk Reduction Factors:   Responsible for children under 35 years of age, Religious beliefs about death and Living with another person, especially a relative  Continued Clinical Symptoms:  Alcohol/Substance Abuse/Dependencies  Cognitive Features That Contribute To Risk:  Polarized thinking    Suicide  Risk:  Minimal: No identifiable suicidal ideation.  Patients presenting with no risk factors but with morbid ruminations; may be classified as minimal risk based on the severity of the depressive symptoms  Principal Problem: <principal problem not specified> Discharge Diagnoses:  Patient Active Problem List   Diagnosis Date Noted  . Substance induced mood disorder [F19.94]   . Suicidal ideation [R45.851]   . Convulsions/seizures [R56.9] 03/25/2014  . Seizure [R56.9] 03/25/2014  . Third trimester pregnancy [Z33.1] 11/16/2013  . Generalized abdominal pain [R10.84] 11/16/2013  . Alcohol abuse [F10.10] 11/16/2013  . Cocaine abuse [F14.10] 11/16/2013  . Depressive disorder [F32.9] 03/19/2013  . Alcohol dependence [F10.20] 02/10/2013  . Cocaine dependence [F14.20] 02/10/2013      Plan Of Care/Follow-up recommendations:  Activity:  As tolerated  Diet:  regular  Is patient on multiple antipsychotic therapies at discharge:  No   Has Patient had three or more failed trials of antipsychotic monotherapy by history:  No  Recommended Plan for Multiple Antipsychotic Therapies: NA    Alieah Brinton, C   PMHNP-BC 04/29/2014, 1:57 PM

## 2014-04-29 NOTE — ED Notes (Signed)
On the phone 
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
## 2014-04-29 NOTE — ED Notes (Signed)
Up on the phone 

## 2014-05-29 ENCOUNTER — Emergency Department: Payer: Self-pay | Admitting: Emergency Medicine

## 2014-07-14 NOTE — H&P (Signed)
PATIENT NAME:  Judy [name], Judy [name] MR#:  [id number] DATE OF BIRTH:  Jan 22, 1981  DATE OF ADMISSION:  03/19/2014  IDENTIFYING INFORMATION: The patient is a 34 year old single female who presents for suicidal thoughts and relapsing to alcohol and cocaine.    HISTORY OF PRESENT ILLNESS: The patient had presented to the Emergency Room and stated she has been using about $1000 worth of cocaine per day.  She related her alcohol use was "all day."  She indicated she would use alcohol in the morning and to the point of blacking out.  She denied having any legal problems surrounding her drinking.  She had reported in a psychiatric consultation that she had actually used $1000 worth of cocaine and alcohol in the past few days.  She had been to [facility] treatment facility last week but immediately relapsed after her discharge.  She is 2 months postpartum at this time.    It was difficult to elicit multiple details from her as she was sleeping throughout most of my session, however, I would increase the volume on my voice, she could respond to questions appropriately with one sentence answers.  Per staff though she has been up at all meal times and getting snacks and been alert during those periods.  She had indicated to the consulting psychiatrist that she had a plan to cut her arm.  She reported depressed feelings and being hopeless.    PAST PSYCHIATRIC HISTORY:  The patient has a past history of depression.  She denied any past suicide attempts.    SOCIAL HISTORY:  She is living with her boyfriend and her [id number]-month-old baby.    MEDICAL HISTORY:  She denied any active medical problems.  She denies any past surgeries.    CURRENT MEDICATIONS:  None.  ALLERGIES:  PENICILLIN AND ZOFRAN.  FAMILY HISTORY:  She denied any knowledge of family history of psychiatric illness.    REVIEW OF SYSTEMS:  The patient stated she was sore.  When I asked where, she stated in her whole body.  She denied any headaches.  She denied any  nausea or vomiting.  She denied any diarrhea or constipation.  She denied any urinary symptoms such as hesitancy or pain on urination.  She denied any dizziness or blurry vision.    MENTAL STATUS EXAMINATION:  The patient was a young female appearing her stated age of 34.  She was lethargic but would wake up to my voice at an elevated volume.  She was alert to place and time.  Her eye contact was poor secondary to being sleepy and tired.  Her speech was soft and slow.  However, when I increased by volume and she was awake her speech appeared to be normal volume and normal rate.  Her thought process was concrete and likely secondary to being sedated.  Her thought content:  She endorsed suicidal ideation.  Her mood was tired.  Her affect was flat but then when I pointed out her being tired and sleepy she had a brief smile.  Her insight and judgment are poor.  Her memory is difficult to assess given her level of sedation.  However, per previous assessments it appears that her remote memory is intact as evident by her ability to provide the details to history such as amounts of substances used.  Her attention and concentration are poor secondary to sedation.    PHYSICAL EXAMINATION:   VITAL SIGNS:  Temperature 98.7, pulse of 71, blood pressure 101/66, respiratory rate of 20. MUSCULOSKELETAL EXAMINATION:  Her strength and tone appear to be normal as she was able to ambulate without complication.  There were no abnormal movements.  No tremors noted.    LABORATORY RESULTS:  Her urine pregnancy test was negative.  Her chemistries were within normal limits with the exception of a mildly decreased BUN at 4, her SGOT was elevated at 49.  Her urine drug screen was positive for cocaine and positive for benzodiazepines.  Her ethanol level was less than 3.  Her CBC was significant for a decreased hematocrit at 30 and a decreased hemoglobin at 9.7.  Her white count was normal at 4.8 and platelets were normal at 312.     DIAGNOSES:  Alcohol use disorder, severe, cocaine use disorder, severe, rule out substance induced mood disorder.  PLAN:  We will continue her on Librium detox.  She is currently written for 3 days of Librium 25 mg q.i.d.  Given sedation I am going to change her Librium taper to be decreased over a 3 day period so thus, she will get Librium 25 mg t.i.d. tomorrow, December 30, and then she will get 25 mg b.i.d. on December 31, and then 25 mg every day on the first.  We will continue to assess her for any signs of withdrawal.  We will reorder her CBC to follow up on the low hemoglobin and hematocrit.    There was a previous dictation that was started on this patient but due to the phone being unclear I disconnected from that previous dictation.   ____________________________ Loralie Champagne Mayford Knife, MD alw:at D: 03/20/2014 11:23:37 ET T: 03/20/2014 12:14:56 ET JOB#: [id number]  cc: Leory Plowman L. Mayford Knife, MD, <Dictator> Kerin Salen MD ELECTRONICALLY SIGNED 03/22/2014 11:59

## 2014-07-18 NOTE — Discharge Summary (Signed)
PATIENT NAME:  Judy KohlHOWARD, Tarika S MR#:  [id number] DATE OF BIRTH:  [birth year]  DATE OF ADMISSION:  03/19/2014 DATE OF DISCHARGE:  03/22/2014  IDENTIFYING DATA: The patient is a 34 year old single female who presented for suicidal thoughts and relapsing to alcohol and cocaine.   CHIEF COMPLAINT: Relapse and being suicidal.   DISCHARGE DIAGNOSES: Alcohol use disorder, severe; cocaine use disorder, severe.  DISCHARGE MEDICATIONS: None.  HOSPITAL COURSE: The patient was admitted after indicating that she had used alcohol all day and used $1000 of cocaine over the past few days prior to admission. She also describes some discord with her boyfriend. She indicated he had been clean and there was some discord because she had relapsed and was using.   The patient was very sleepy and tired on 03/20/2014: however, she indicated that she did feel somewhat depressed and hopeless. however, the next day, December 30th, she stated the only reason she indicated she was suicidal was because she wanted admission for detox. She stated that all the beds close to her in her local area were full and she thought that by saying she was suicidal that would be the only way she would get admitted and treated for detox; however, she realized there were not going to be extensive medications for her detox and thus, she stated she was ready to leave. She also indicated to this writer on the day of discharge that one of the reasons she was adamant she wanted to leave on December 30th was because she thought that her boyfriend was having some type of affair, and she wanted to go and confront him about this. however, today, she continues to state she is not suicidal. She states she has a plan to go stay with relatives in [place]. She also states she wants to be able to see her 6777-month-old baby. She states she is fine with following up with her outpatient appointments and resuming the medications that she was prescribed from them that  included Seroquel and Lexapro. She indicated she has a supply of these, and she was not interested in starting them in the hospital.   She was noted to be slightly anemic on admission with her hemoglobin being 9.7 and her hematocrit being 30. These studies were rechecked and she was stable, and the repeat numbers were hemoglobin of 10.6 and a hematocrit of 34. She denied any physical complaints during the admission.  LABORATORY RESULTS: As discussed above.   DISCHARGE DISPOSITION: The patient will be discharged with a cab, and she has a plan to go get a coat at a department store and then go connect with family in the [place] area.   MEDICATIONS: The patient is not going to be given discharge medications, as she declined any in the hospital.   FOLLOWUP APPOINTMENTS: The patient has a followup appointment with [facility]. Social work has contacted them and confirmed this appointment.    ____________________________ Loralie ChampagneAlton L. Mayford KnifeWilliams, MD alw:mw D: 03/22/2014 11:34:19 ET T: 03/22/2014 14:16:13 ET JOB#: [id number]  cc: Leory PlowmanAlton L. Mayford KnifeWilliams, MD, <Dictator> Kerin SalenALTON L Larrie Lucia MD ELECTRONICALLY SIGNED 04/04/2014 9:12

## 2014-07-18 NOTE — Consult Note (Signed)
PATIENT NAME:  Judy [name], Judy [name] MR#:  [id number] DATE OF BIRTH:  Apr 27, 1980  DATE OF CONSULTATION:  03/19/2014  CONSULTING PHYSICIAN:  Almendra Loria [name]. Garnetta BuddyFaheem, MD  REASON FOR CONSULTATION: " I am suicidal and need detox from alcohol and cocaine."   HISTORY OF PRESENT ILLNESS: The patient is a 34 year old single African American female who presented to the ER for assessment as she reported that she has been using $1000 worth of alcohol and cocaine and has been having suicidal ideations. She reported that she has a plan to cut her wrist. The patient reported that she currently lives with her child'[name] father and he brought her to the hospital. She reported that she consumed approximately $1000 worth of cocaine and alcohol in the past few days. She reported that the boyfriend is fed up with her and he has asked her to get detoxed and to straighten up her life. The patient reported that she is two months postpartum at this time. She reported that she went to [facility] last week and has been unable to maintain her sobriety, and relapsed after discharge.   During my interview, the patient appeared very intoxicated and was sedated during the interview. She stated that she has been using a lot of drugs and was not able to take her medications. She stated that she needs help at this time. She was and depressed, hopeless, and helpless at this time. She reported that she has a plan to cut her arm.   PAST PSYCHIATRIC HISTORY: The patient has history of depression, and she also presented last week to the ER when she had similar symptoms. At that time, she was interviewed and was transferred to [facility]. She completed one week of detox over there. She reported that she has been using $1000 worth a day of alcohol as well as cocaine. She is unable to kick the habit. She currently denied having any previous history of suicide attempts.   SOCIAL HISTORY: The patient is currently living with her boyfriend and has a 7961-month-old baby.    ALLERGIES: PENICILLIN AND ZOFRAN.   CURRENT MEDICATIONS: None reported.   FAMILY HISTORY:  She currently denied having any family history of psychiatric illness.   PAST SURGICAL HISTORY: None reported.   VITAL SIGNS: Temperature 98.2, pulse 78, respirations 18, blood pressure 109/73.   LABORATORY DATA: Urine pregnancy test is negative. Glucose 80, BUN 4, creatinine 0.81, sodium 137, potassium 4.4, chloride 103, bicarbonate 25, anion gap is 9, osmolality 270, calcium 9.4, lipase is 86, protein is 8.2, albumin 3.6, bilirubin 0.4, alkaline phosphatase is a 78, AST is 49, ALT 22.  Urine drug screen is positive for benzodiazepines, cocaine and WBC 4.8, RBC 3.7, .7, hematocrit is 30. MCV is 81, RDW 17.   REVIEW OF SYSTEMS:    CONSTITUTIONAL: The patient was unable to participate in the interview and to answer these questions.   MENTAL STATUS EXAMINATION: The patient is a thinly built female who was lying in the bed. She appeared sedated and was barely able to open her eyes. Her gait and station was not tested. Speech was low in tone and volume and somewhat slurred. Her thought process tangential.  Thought content was non-delusional having suicidal ideations with a plan to cut her wrists. Her insight and judgment were poor. She was sedated and was not oriented to time, place, and person. Her recent and remote memory was not tested. Attention span and concentration were poor. Her language was poor. Mood was depressed and anxious and  affect was congruent.   DIAGNOSTIC IMPRESSION:  AXIS I:  1. Alcohol intoxication.  2.  Major depressive disorder, recurrent, severe, without psychotic features.  AXIS II: None reported.  AXIS III: Postpartum.  AXIS IV: Severe.  AXIS V: Current global assessment of functioning 25.   TREATMENT PLAN:  1.  The patient is currently on involuntary commitment and will be admitted to the inpatient behavioral health unit for stabilization and safety.  2.  She will be  started on Librium 25 mg p.o. q.6 hour to help with alcohol withdrawal.  3.  She will also be given Thiamine and folic acid.  We will continue to monitor and the treatment team will adjust her medications.   Thank you for allowing me to participate in the care of this patient at this time.    ____________________________ Ardeen Fillers Garnetta Buddy, MD usf:at D: 03/19/2014 11:59:56 ET T: 03/19/2014 12:35:45 ET JOB#: [id number]  cc: Ardeen Fillers. Garnetta Buddy, MD, <Dictator> Rhunette Croft MD ELECTRONICALLY SIGNED 04/02/2014 12:36

## 2014-07-22 NOTE — Consult Note (Signed)
Brief Consult Note: Diagnosis: alcohol and cocaine abuse.   Patient was seen by consultant.   Comments: Psychiatry: PAtient seen and chart reviewed. PAtient with history of cocaine and alcohol abuse. Here voluntarily. PAtient currently asleep and can only mutter. Not giving any history. Clearly has been abusing alcohol and cocaine. Appropriate disposistion planning nnot possible until patient can be interviewed. Will continue to follow.  Electronic Signatures: Audery Amellapacs, John T (MD)  (Signed 08-Mar-16 13:17)  Authored: Brief Consult Note   Last Updated: 08-Mar-16 13:17 by Audery Amellapacs, John T (MD)

## 2014-07-22 NOTE — Consult Note (Signed)
Psychiatry: Attempted to reevaluate patient.  She remains sedated and possibly delirious and unable to give any history or information.  We will reevaluate later once she is more alert and interactive.  Electronic Signatures: Clapacs, Jackquline DenmarkJohn T (MD)  (Signed on 08-Mar-16 17:17)  Authored  Last Updated: 08-Mar-16 17:17 by Audery Amellapacs, John T (MD)

## 2014-09-21 ENCOUNTER — Encounter (HOSPITAL_COMMUNITY): Payer: Self-pay | Admitting: *Deleted

## 2016-02-04 IMAGING — US US OB LIMITED
1 series · 14 of 20 positions shown · non-contrast
Comparison: none

CLINICAL DATA: Contractions. Substance addiction. Twenty-six and 3
days pregnant by previous ultrasound.

EXAM:
LIMITED OBSTETRIC ULTRASOUND

[Series 1: us ob limited · 0.22mm/px · 14 of 20 slices shown]
[im 1/20]
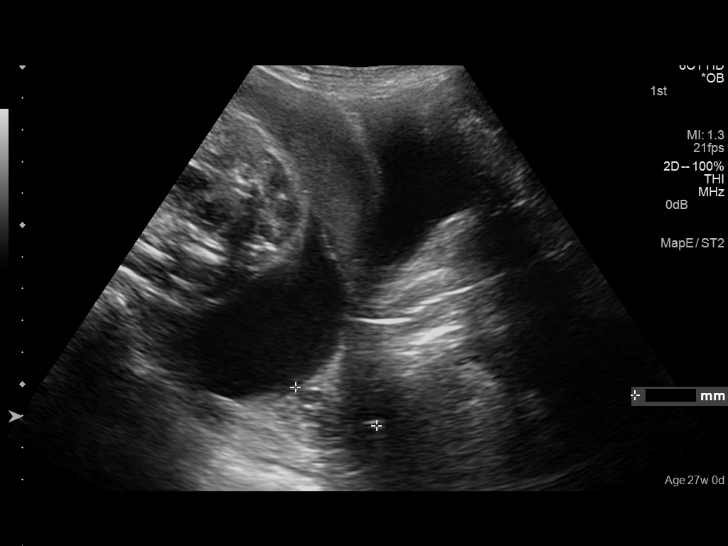
[im 3/20]
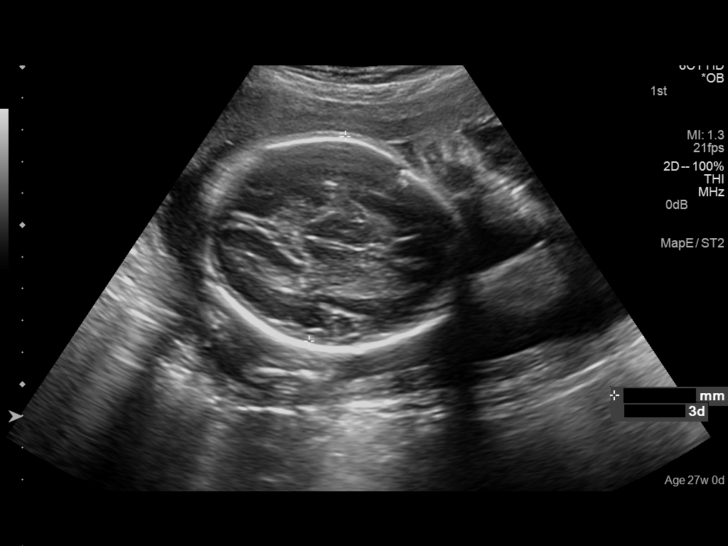
[im 4/20]
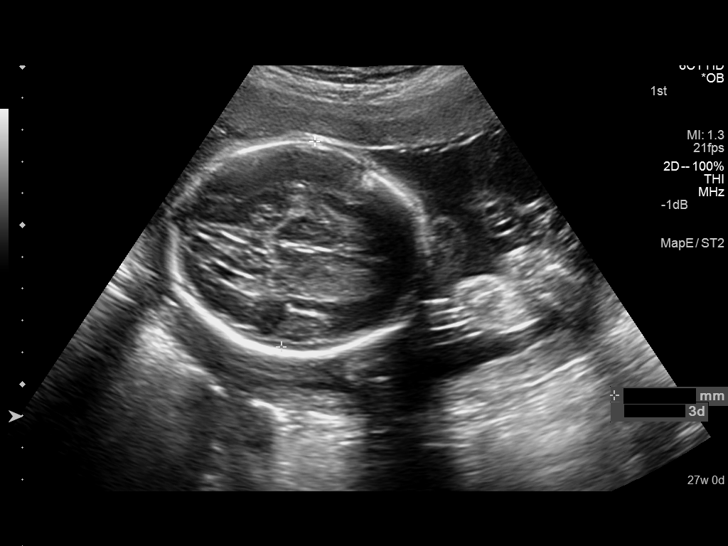
[im 6/20]
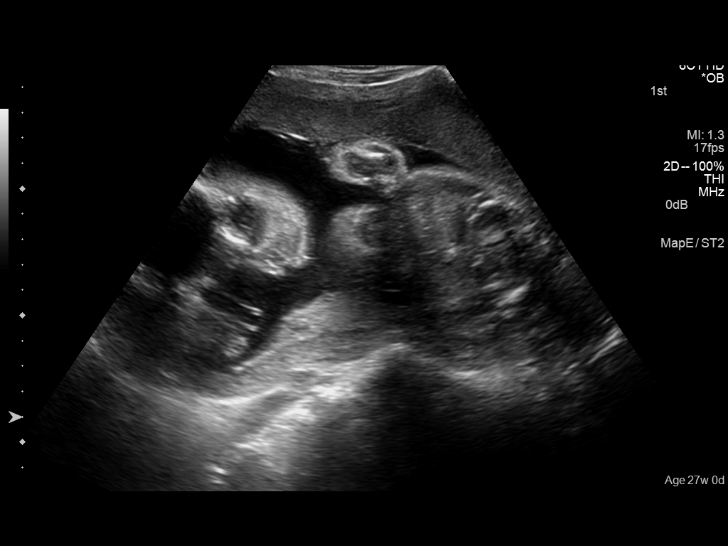
[im 7/20]
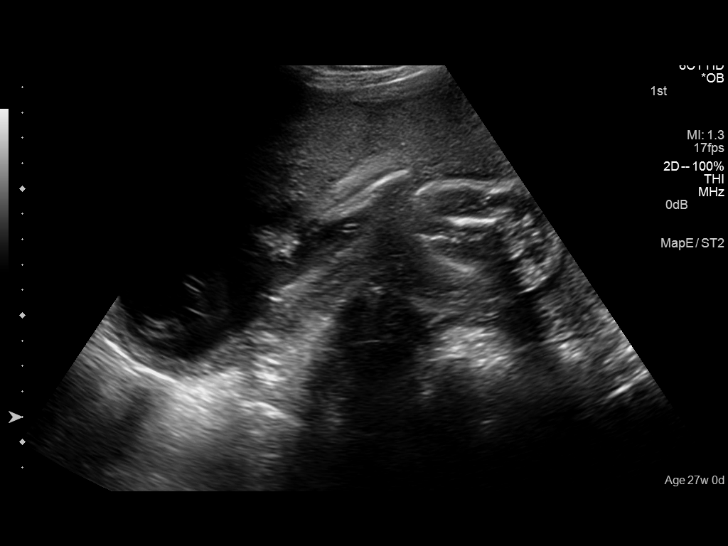
[im 8/20]
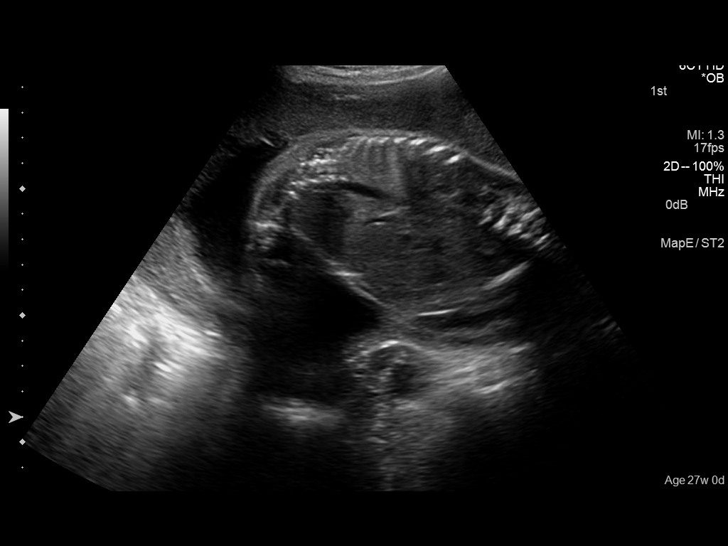
[im 10/20]
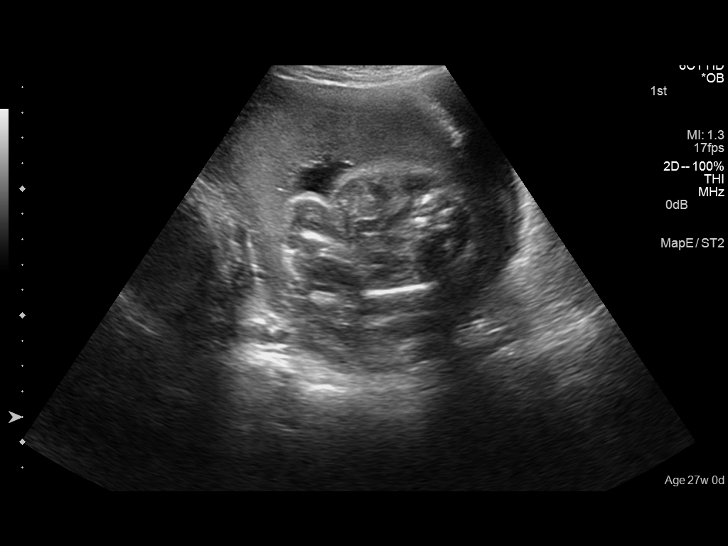
[im 11/20]
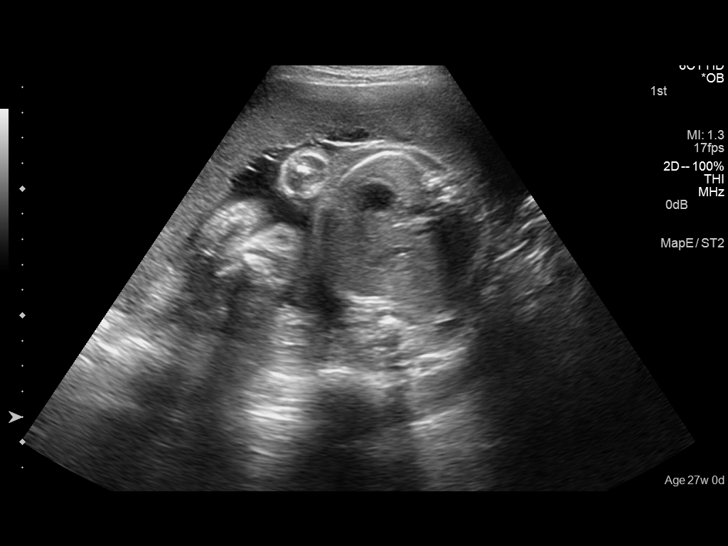
[im 13/20]
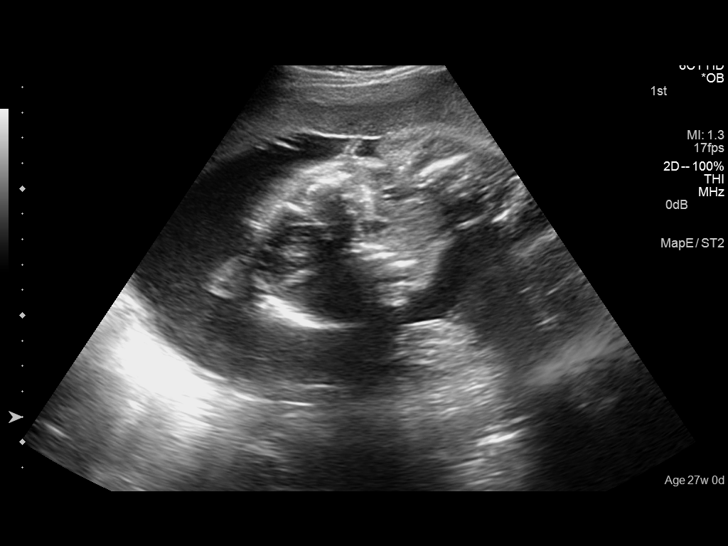
[im 14/20]
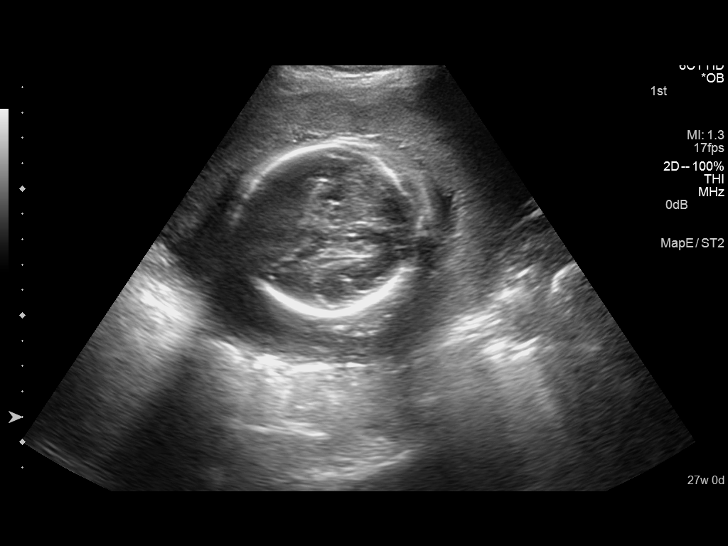
[im 16/20]
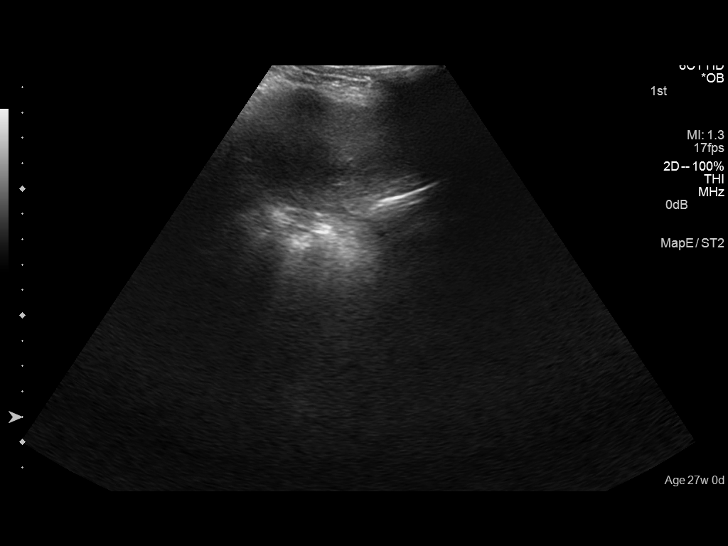
[im 17/20]
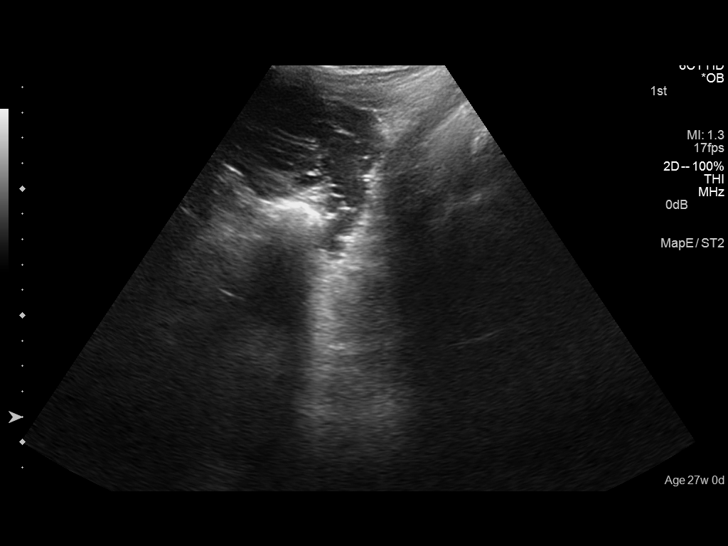
[im 18/20]
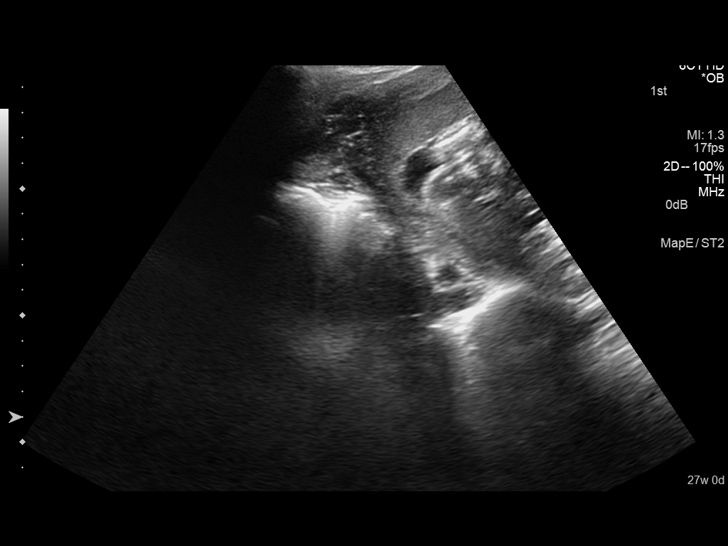
[im 20/20]
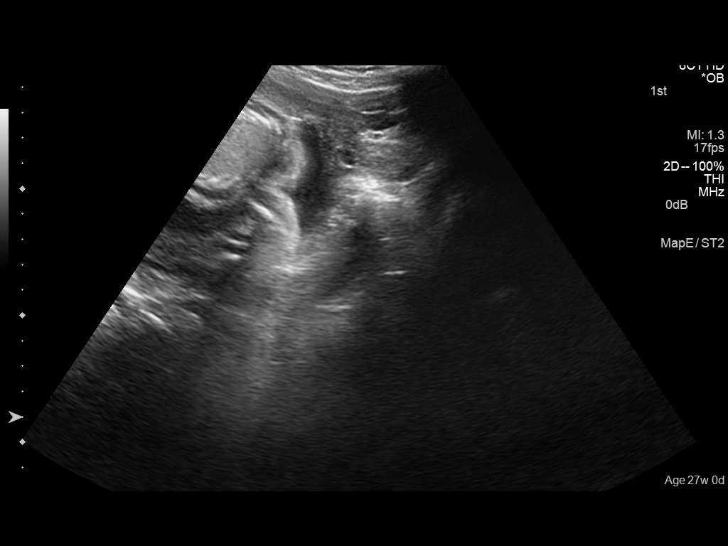

[14 of 20 positions shown; findings below may reference images not displayed]

FINDINGS: Number of Fetuses: 1

Heart Rate:  140 bpm

Movement: Yes

Presentation: Breech

Placental Location: Anterior

Previa: No

Amniotic Fluid (Subjective):  Somewhat low.

BPD:  6.6cm 26w  3d

MATERNAL FINDINGS:

Cervix:  Appears closed.

Uterus/Adnexae: The ovaries were not visualized. No free peritoneal
fluid seen.
IMPRESSION: 1. Single live intrauterine gestation in a breech presentation with
an estimated gestational age by BPD of 26 weeks and 3 days.

2. Subjectively somewhat low amniotic fluid volume. This could be
better determined with a complete obstetrical ultrasound with
amniotic fluid volume measurements.

This exam is performed on an emergent basis and does not
comprehensively evaluate fetal size, dating, or anatomy; follow-up
complete OB US should be considered if further fetal assessment is
warranted.

## 2016-06-12 IMAGING — CT CT MAXILLOFACIAL W/O CM
3 of 6 series · 15 of 47 positions shown, 18 images · non-contrast
Comparison: Head CT [DATE] [DATE], [DATE] [DATE] a.m., May 13, 2013

CLINICAL DATA: Seizure. Facial bone fracture is seen on head CT of
March 25, 2014

EXAM:
CT HEAD WITHOUT CONTRAST
CT MAXILLOFACIAL WITHOUT CONTRAST
TECHNIQUE: Multidetector CT imaging of the head and maxillofacial structures
were performed using the standard protocol without intravenous
contrast. Multiplanar CT image reconstructions of the maxillofacial
structures were also generated.

[Series 10: sagittal soft tissue · sagittal · 0.35mm/px · 3 of 78 slices shown]
[im 26/78  bone]
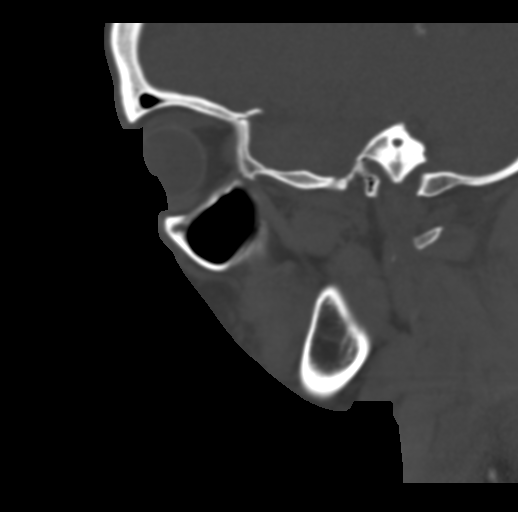
[im 39/78  bone]
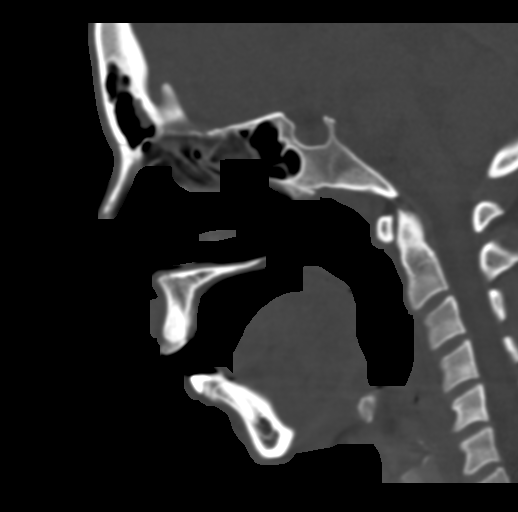
[im 52/78  bone]
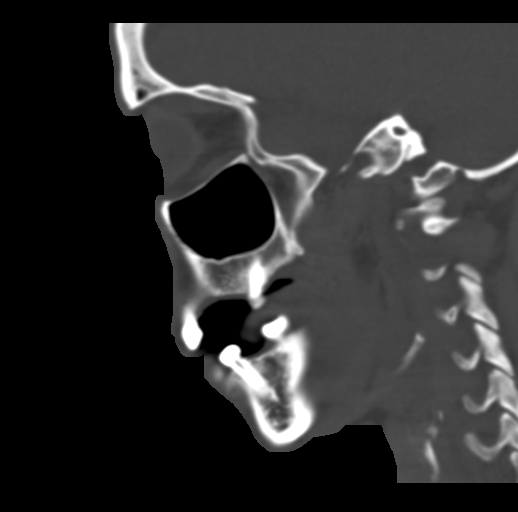

[Series 11: coronal bone · coronal · 0.37mm/px · 3 of 78 slices shown]
[im 20/78  bone]
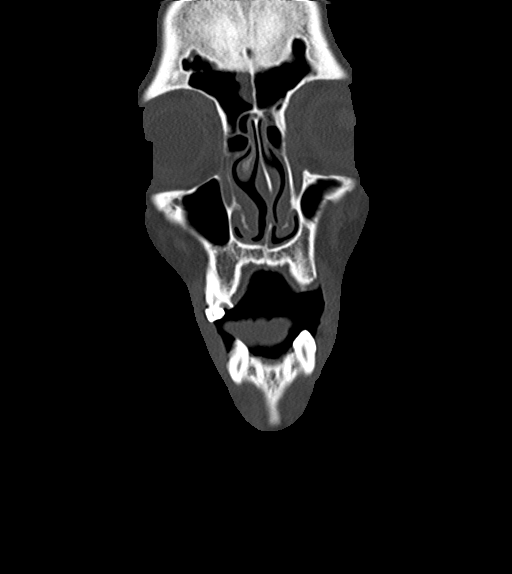
[im 39/78  bone]
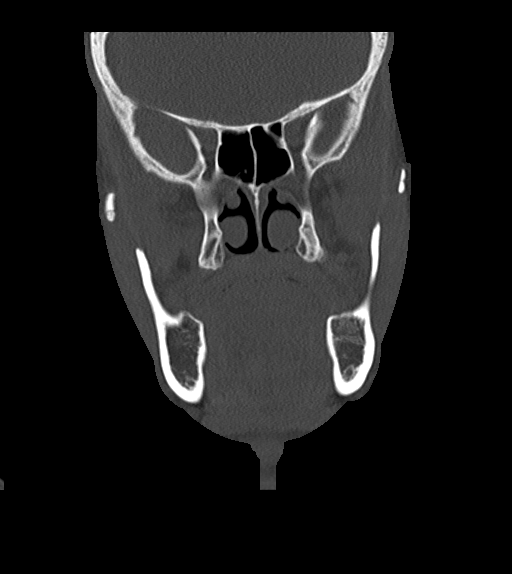
[im 58/78  bone]
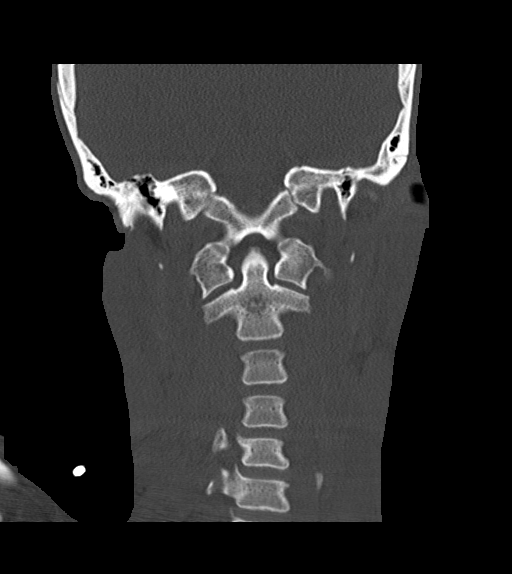

[Series 14: symmetry bone · axial · 0.41mm/px · z∈[-192,-54]mm · 9 of 87 slices shown, 12 images]
[im 8/87  brain]
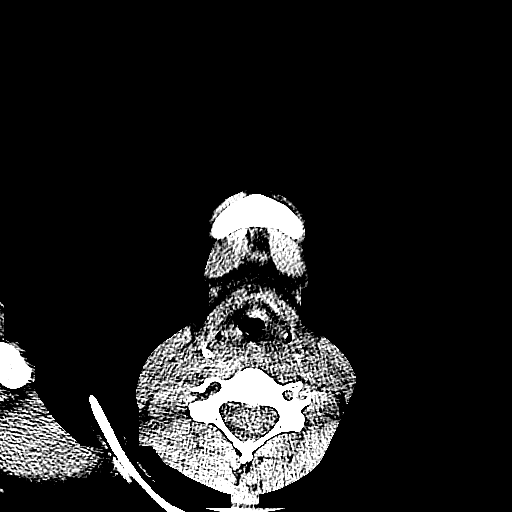
[im 8/87  bone]
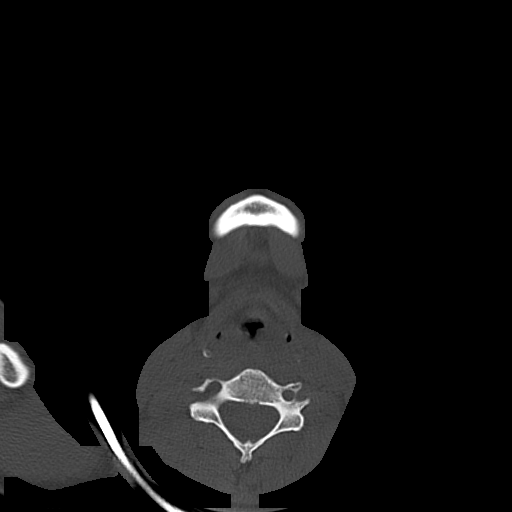
[im 15/87  bone]
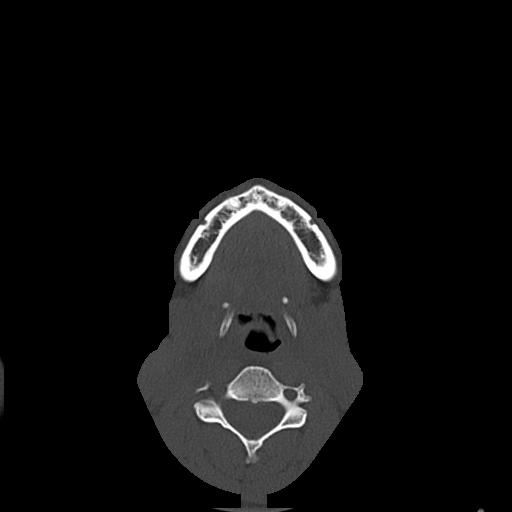
[im 29/87  bone]
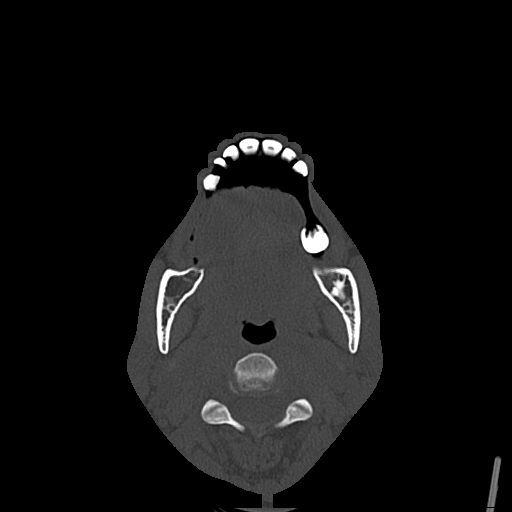
[im 36/87  bone]
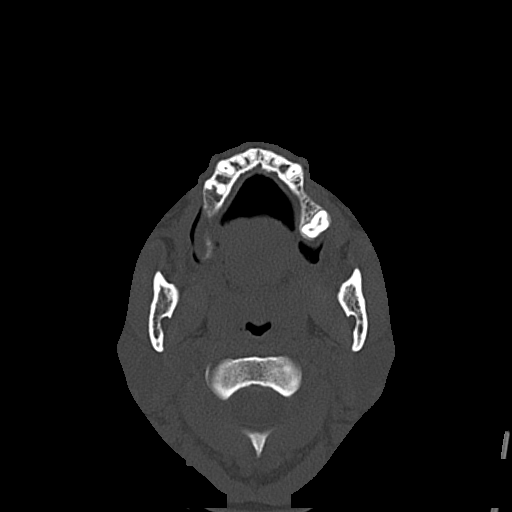
[im 44/87  brain]
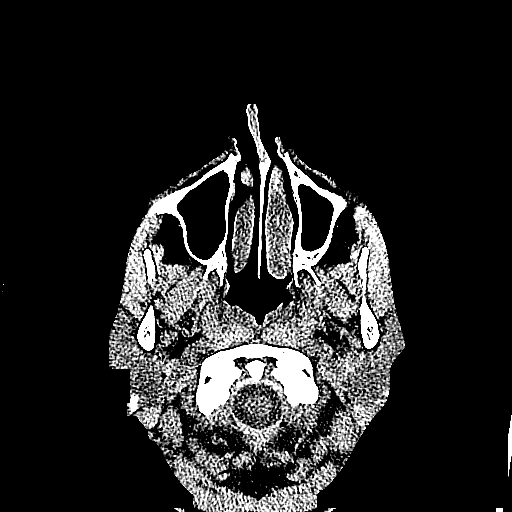
[im 44/87  bone]
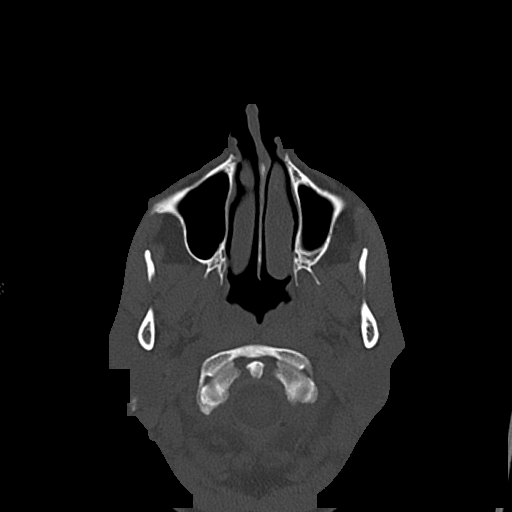
[im 51/87  bone]
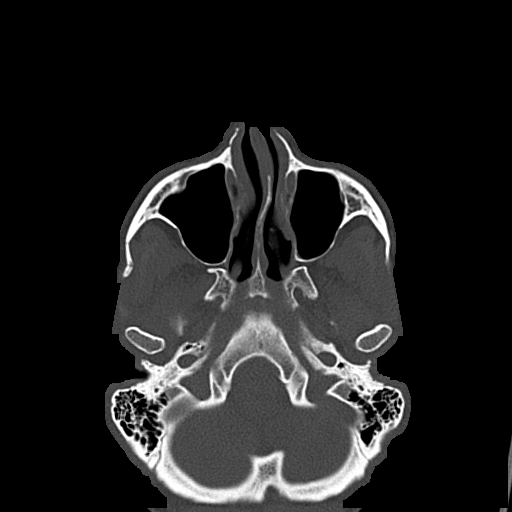
[im 58/87  bone]
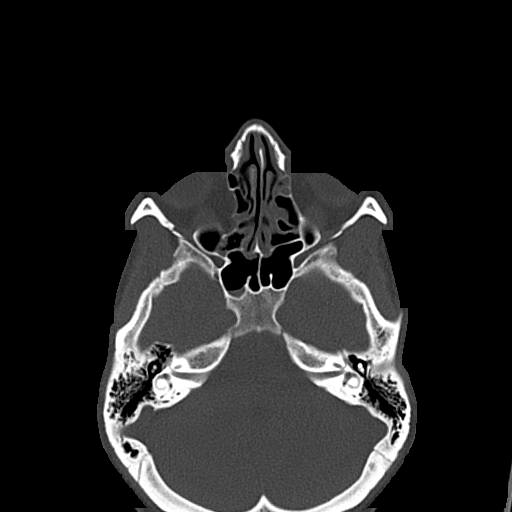
[im 72/87  bone]
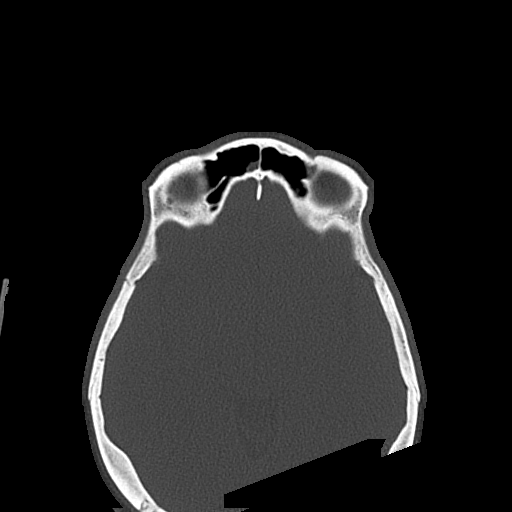
[im 79/87  brain]
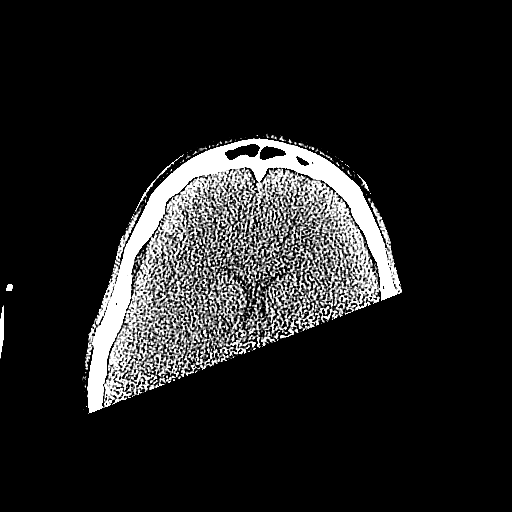
[im 79/87  bone]
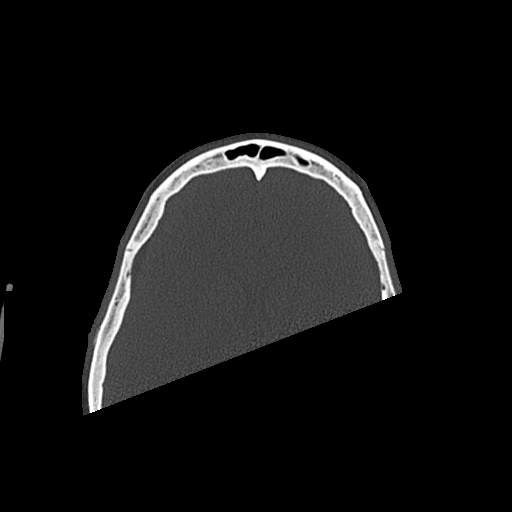

[15 of 47 positions shown; findings below may reference images not displayed]

FINDINGS: CT HEAD FINDINGS

There is no midline shift, hydrocephalus, or mass. No acute
hemorrhage or acute transcortical infarct is identified. The bony
calvarium is intact. Mucoperiosteal thickening of bilateral ethmoid,
right frontal and right sphenoid sinuses are identified. There is
comminuted depressed fracture of the right zygomatic arch. There is
chronic deformity of the left zygomatic arch unchanged compared to
prior CT of May 13, 2013.

CT MAXILLOFACIAL FINDINGS

There is comminuted depressed fracture of the right zygomatic arch.
No other acute fracture or dislocation is identified in the
maxillofacial bones. There is chronic deformity of the left
zygomatic marked which unchanged compared to prior CT of April 2013. There are is mucoperiosteal thickening of the bilateral
ethmoid, right frontal and right sphenoid sinuses. There is chronic
nasal septal deviation of right to left unchanged.
IMPRESSION: No focal acute intracranial abnormality is identified. There is a
depressed comminuted fracture of the right zygomatic arch. No other
acute fracture or dislocation is identified within the maxillofacial
bones.

## 2022-09-14 ENCOUNTER — Encounter (HOSPITAL_COMMUNITY): Payer: Self-pay

## 2022-09-14 ENCOUNTER — Inpatient Hospital Stay (HOSPITAL_COMMUNITY)
Admission: EM | Admit: 2022-09-14 | Discharge: 2022-09-16 | DRG: 641 | Discharge status: Against Medical Advice | Payer: Medicaid Other | Attending: Internal Medicine | Admitting: Internal Medicine

## 2022-09-14 ENCOUNTER — Emergency Department (HOSPITAL_COMMUNITY): Payer: Medicaid Other

## 2022-09-14 DIAGNOSIS — F10229 Alcohol dependence with intoxication, unspecified: Secondary | ICD-10-CM | POA: Diagnosis present

## 2022-09-14 DIAGNOSIS — F32A Depression, unspecified: Secondary | ICD-10-CM | POA: Diagnosis present

## 2022-09-14 DIAGNOSIS — Z884 Allergy status to anesthetic agent status: Secondary | ICD-10-CM

## 2022-09-14 DIAGNOSIS — F17210 Nicotine dependence, cigarettes, uncomplicated: Secondary | ICD-10-CM | POA: Diagnosis present

## 2022-09-14 DIAGNOSIS — Z888 Allergy status to other drugs, medicaments and biological substances status: Secondary | ICD-10-CM

## 2022-09-14 DIAGNOSIS — Z79899 Other long term (current) drug therapy: Secondary | ICD-10-CM

## 2022-09-14 DIAGNOSIS — Z881 Allergy status to other antibiotic agents status: Secondary | ICD-10-CM

## 2022-09-14 DIAGNOSIS — E878 Other disorders of electrolyte and fluid balance, not elsewhere classified: Secondary | ICD-10-CM | POA: Diagnosis present

## 2022-09-14 DIAGNOSIS — Z882 Allergy status to sulfonamides status: Secondary | ICD-10-CM

## 2022-09-14 DIAGNOSIS — F1020 Alcohol dependence, uncomplicated: Secondary | ICD-10-CM | POA: Diagnosis present

## 2022-09-14 DIAGNOSIS — F1910 Other psychoactive substance abuse, uncomplicated: Principal | ICD-10-CM

## 2022-09-14 DIAGNOSIS — Z823 Family history of stroke: Secondary | ICD-10-CM

## 2022-09-14 DIAGNOSIS — Z880 Allergy status to penicillin: Secondary | ICD-10-CM

## 2022-09-14 DIAGNOSIS — F1410 Cocaine abuse, uncomplicated: Secondary | ICD-10-CM | POA: Diagnosis present

## 2022-09-14 DIAGNOSIS — F10239 Alcohol dependence with withdrawal, unspecified: Secondary | ICD-10-CM | POA: Diagnosis present

## 2022-09-14 DIAGNOSIS — E860 Dehydration: Secondary | ICD-10-CM | POA: Diagnosis present

## 2022-09-14 DIAGNOSIS — R64 Cachexia: Secondary | ICD-10-CM | POA: Diagnosis present

## 2022-09-14 DIAGNOSIS — E876 Hypokalemia: Principal | ICD-10-CM | POA: Diagnosis present

## 2022-09-14 DIAGNOSIS — Z5329 Procedure and treatment not carried out because of patient's decision for other reasons: Secondary | ICD-10-CM | POA: Diagnosis not present

## 2022-09-14 DIAGNOSIS — F411 Generalized anxiety disorder: Secondary | ICD-10-CM | POA: Diagnosis present

## 2022-09-14 DIAGNOSIS — Y900 Blood alcohol level of less than 20 mg/100 ml: Secondary | ICD-10-CM | POA: Diagnosis present

## 2022-09-14 DIAGNOSIS — F319 Bipolar disorder, unspecified: Secondary | ICD-10-CM | POA: Diagnosis present

## 2022-09-14 LAB — CBC
HCT: 34.8 % — ABNORMAL LOW (ref 36.0–46.0)
Hemoglobin: 10.9 g/dL — ABNORMAL LOW (ref 12.0–15.0)
MCH: 25.2 pg — ABNORMAL LOW (ref 26.0–34.0)
MCHC: 31.3 g/dL (ref 30.0–36.0)
MCV: 80.4 fL (ref 80.0–100.0)
Platelets: 326 10*3/uL (ref 150–400)
RBC: 4.33 MIL/uL (ref 3.87–5.11)
RDW: 16.8 % — ABNORMAL HIGH (ref 11.5–15.5)
WBC: 6.7 10*3/uL (ref 4.0–10.5)
nRBC: 0 % (ref 0.0–0.2)

## 2022-09-14 LAB — I-STAT BETA HCG BLOOD, ED (MC, WL, AP ONLY): I-stat hCG, quantitative: <5 m[IU]/mL (ref <5)

## 2022-09-14 NOTE — ED Notes (Signed)
Pt refused chest x-ray

## 2022-09-14 NOTE — ED Triage Notes (Signed)
Pt to ED with complaint of CP. Also requesting detox from alcohol and cocaine. Last drink tonight - drinks approx per day per patient. Last cocaine use last evening.

## 2022-09-15 ENCOUNTER — Other Ambulatory Visit: Payer: Self-pay

## 2022-09-15 DIAGNOSIS — F10239 Alcohol dependence with withdrawal, unspecified: Secondary | ICD-10-CM | POA: Diagnosis present

## 2022-09-15 DIAGNOSIS — Z79899 Other long term (current) drug therapy: Secondary | ICD-10-CM | POA: Diagnosis not present

## 2022-09-15 DIAGNOSIS — F411 Generalized anxiety disorder: Secondary | ICD-10-CM | POA: Diagnosis present

## 2022-09-15 DIAGNOSIS — F1910 Other psychoactive substance abuse, uncomplicated: Secondary | ICD-10-CM | POA: Diagnosis not present

## 2022-09-15 DIAGNOSIS — E876 Hypokalemia: Secondary | ICD-10-CM | POA: Diagnosis present

## 2022-09-15 DIAGNOSIS — F32A Depression, unspecified: Secondary | ICD-10-CM

## 2022-09-15 DIAGNOSIS — F319 Bipolar disorder, unspecified: Secondary | ICD-10-CM | POA: Diagnosis present

## 2022-09-15 DIAGNOSIS — R64 Cachexia: Secondary | ICD-10-CM | POA: Diagnosis present

## 2022-09-15 DIAGNOSIS — F1410 Cocaine abuse, uncomplicated: Secondary | ICD-10-CM | POA: Diagnosis present

## 2022-09-15 DIAGNOSIS — Z882 Allergy status to sulfonamides status: Secondary | ICD-10-CM | POA: Diagnosis not present

## 2022-09-15 DIAGNOSIS — Z884 Allergy status to anesthetic agent status: Secondary | ICD-10-CM | POA: Diagnosis not present

## 2022-09-15 DIAGNOSIS — Z880 Allergy status to penicillin: Secondary | ICD-10-CM | POA: Diagnosis not present

## 2022-09-15 DIAGNOSIS — E860 Dehydration: Secondary | ICD-10-CM | POA: Diagnosis present

## 2022-09-15 DIAGNOSIS — Z823 Family history of stroke: Secondary | ICD-10-CM | POA: Diagnosis not present

## 2022-09-15 DIAGNOSIS — F1020 Alcohol dependence, uncomplicated: Secondary | ICD-10-CM

## 2022-09-15 DIAGNOSIS — Z5329 Procedure and treatment not carried out because of patient's decision for other reasons: Secondary | ICD-10-CM | POA: Diagnosis not present

## 2022-09-15 DIAGNOSIS — Y900 Blood alcohol level of less than 20 mg/100 ml: Secondary | ICD-10-CM | POA: Diagnosis present

## 2022-09-15 DIAGNOSIS — Z888 Allergy status to other drugs, medicaments and biological substances status: Secondary | ICD-10-CM | POA: Diagnosis not present

## 2022-09-15 DIAGNOSIS — Z881 Allergy status to other antibiotic agents status: Secondary | ICD-10-CM | POA: Diagnosis not present

## 2022-09-15 DIAGNOSIS — F10229 Alcohol dependence with intoxication, unspecified: Secondary | ICD-10-CM | POA: Diagnosis present

## 2022-09-15 DIAGNOSIS — E878 Other disorders of electrolyte and fluid balance, not elsewhere classified: Secondary | ICD-10-CM | POA: Diagnosis present

## 2022-09-15 DIAGNOSIS — R112 Nausea with vomiting, unspecified: Secondary | ICD-10-CM | POA: Diagnosis present

## 2022-09-15 DIAGNOSIS — F17210 Nicotine dependence, cigarettes, uncomplicated: Secondary | ICD-10-CM | POA: Diagnosis present

## 2022-09-15 LAB — LACTIC ACID, PLASMA
Lactic Acid, Venous: 1.6 mmol/L (ref 0.5–1.9)
Lactic Acid, Venous: 1.4 mmol/L (ref 0.5–1.9)

## 2022-09-15 LAB — COMPREHENSIVE METABOLIC PANEL
ALT: 24 U/L (ref 0–44)
AST: 35 U/L (ref 15–41)
Albumin: 3.5 g/dL (ref 3.5–5.0)
Alkaline Phosphatase: 64 U/L (ref 38–126)
Anion gap: 15 (ref 5–15)
BUN: 8 mg/dL (ref 6–20)
CO2: 34 mmol/L — ABNORMAL HIGH (ref 22–32)
Calcium: 9.4 mg/dL (ref 8.9–10.3)
Chloride: 90 mmol/L — ABNORMAL LOW (ref 98–111)
Creatinine, Ser: 0.91 mg/dL (ref 0.44–1.00)
GFR, Estimated: >60 mL/min (ref >60)
Glucose, Bld: 106 mg/dL — ABNORMAL HIGH (ref 70–99)
Potassium: 2 mmol/L — CL (ref 3.5–5.1)
Sodium: 139 mmol/L (ref 135–145)
Total Bilirubin: 0.3 mg/dL (ref 0.3–1.2)
Total Protein: 6.9 g/dL (ref 6.5–8.1)

## 2022-09-15 LAB — CBC
HCT: 36.1 % (ref 36.0–46.0)
Hemoglobin: 11.4 g/dL — ABNORMAL LOW (ref 12.0–15.0)
MCH: 25.7 pg — ABNORMAL LOW (ref 26.0–34.0)
MCHC: 31.6 g/dL (ref 30.0–36.0)
MCV: 81.5 fL (ref 80.0–100.0)
Platelets: 311 10*3/uL (ref 150–400)
RBC: 4.43 MIL/uL (ref 3.87–5.11)
RDW: 17 % — ABNORMAL HIGH (ref 11.5–15.5)
WBC: 6.2 10*3/uL (ref 4.0–10.5)
nRBC: 0 % (ref 0.0–0.2)

## 2022-09-15 LAB — BASIC METABOLIC PANEL
Anion gap: 12 (ref 5–15)
BUN: 10 mg/dL (ref 6–20)
CO2: 34 mmol/L — ABNORMAL HIGH (ref 22–32)
Calcium: 9.3 mg/dL (ref 8.9–10.3)
Chloride: 91 mmol/L — ABNORMAL LOW (ref 98–111)
Creatinine, Ser: 0.88 mg/dL (ref 0.44–1.00)
GFR, Estimated: >60 mL/min (ref >60)
Glucose, Bld: 80 mg/dL (ref 70–99)
Potassium: 2.5 mmol/L — CL (ref 3.5–5.1)
Sodium: 137 mmol/L (ref 135–145)

## 2022-09-15 LAB — HIV ANTIBODY (ROUTINE TESTING W REFLEX): HIV Screen 4th Generation wRfx: NONREACTIVE

## 2022-09-15 LAB — MAGNESIUM: Magnesium: 1.9 mg/dL (ref 1.7–2.4)

## 2022-09-15 LAB — TROPONIN I (HIGH SENSITIVITY)
Troponin I (High Sensitivity): 5 ng/L (ref <18)
Troponin I (High Sensitivity): 6 ng/L (ref <18)

## 2022-09-15 LAB — ETHANOL: Alcohol, Ethyl (B): 12 mg/dL — ABNORMAL HIGH (ref <10)

## 2022-09-15 MED ORDER — MAGNESIUM SULFATE 2 GM/50ML IV SOLN
2.0000 g | Freq: Once | INTRAVENOUS | Status: AC
  Administered 2022-09-15: 2 g via INTRAVENOUS
  Filled 2022-09-15: qty 50

## 2022-09-15 MED ORDER — ONDANSETRON HCL 4 MG PO TABS: 4.0000 mg | ORAL_TABLET | Freq: Four times a day (QID) | ORAL | Status: DC | PRN

## 2022-09-15 MED ORDER — THIAMINE MONONITRATE 100 MG PO TABS
100.0000 mg | ORAL_TABLET | Freq: Every day | ORAL | Status: DC
  Administered 2022-09-15: 100 mg via ORAL
  Filled 2022-09-15 (×2): qty 1

## 2022-09-15 MED ORDER — LOPERAMIDE HCL 2 MG PO CAPS: 2.0000 mg | ORAL_CAPSULE | ORAL | Status: DC | PRN

## 2022-09-15 MED ORDER — THIAMINE HCL 100 MG/ML IJ SOLN
100.0000 mg | Freq: Every day | INTRAMUSCULAR | Status: DC
  Filled 2022-09-15: qty 2

## 2022-09-15 MED ORDER — ENOXAPARIN SODIUM 40 MG/0.4ML IJ SOSY
40.0000 mg | PREFILLED_SYRINGE | INTRAMUSCULAR | Status: DC
  Administered 2022-09-15: 40 mg via SUBCUTANEOUS
  Filled 2022-09-15: qty 0.4

## 2022-09-15 MED ORDER — TRAZODONE HCL 50 MG PO TABS
25.0000 mg | ORAL_TABLET | Freq: Every evening | ORAL | Status: DC | PRN
  Administered 2022-09-15: 25 mg via ORAL
  Filled 2022-09-15: qty 1

## 2022-09-15 MED ORDER — QUETIAPINE FUMARATE 25 MG PO TABS
25.0000 mg | ORAL_TABLET | Freq: Two times a day (BID) | ORAL | Status: DC | PRN
  Administered 2022-09-15: 25 mg via ORAL
  Filled 2022-09-15: qty 1

## 2022-09-15 MED ORDER — POTASSIUM CHLORIDE CRYS ER 20 MEQ PO TBCR
20.0000 meq | EXTENDED_RELEASE_TABLET | Freq: Three times a day (TID) | ORAL | Status: AC
  Administered 2022-09-15 (×3): 20 meq via ORAL
  Filled 2022-09-15 (×3): qty 1

## 2022-09-15 MED ORDER — POTASSIUM CHLORIDE 10 MEQ/100ML IV SOLN
10.0000 meq | INTRAVENOUS | Status: AC
  Administered 2022-09-15: 10 meq via INTRAVENOUS
  Filled 2022-09-15: qty 100

## 2022-09-15 MED ORDER — ENSURE ENLIVE PO LIQD
237.0000 mL | Freq: Two times a day (BID) | ORAL | Status: DC
  Administered 2022-09-15: 237 mL via ORAL

## 2022-09-15 MED ORDER — OXYCODONE-ACETAMINOPHEN 5-325 MG PO TABS
1.0000 | ORAL_TABLET | Freq: Once | ORAL | Status: AC
  Administered 2022-09-15: 1 via ORAL
  Filled 2022-09-15: qty 1

## 2022-09-15 MED ORDER — ACETAMINOPHEN 325 MG PO TABS
650.0000 mg | ORAL_TABLET | Freq: Four times a day (QID) | ORAL | Status: DC | PRN
  Administered 2022-09-15: 650 mg via ORAL
  Filled 2022-09-15: qty 2

## 2022-09-15 MED ORDER — POTASSIUM CHLORIDE CRYS ER 20 MEQ PO TBCR
40.0000 meq | EXTENDED_RELEASE_TABLET | Freq: Once | ORAL | Status: AC
  Administered 2022-09-15: 40 meq via ORAL
  Filled 2022-09-15: qty 2

## 2022-09-15 MED ORDER — ACETAMINOPHEN 650 MG RE SUPP: 650.0000 mg | Freq: Four times a day (QID) | RECTAL | Status: DC | PRN

## 2022-09-15 MED ORDER — LORAZEPAM 2 MG/ML IJ SOLN
0.0000 mg | Freq: Four times a day (QID) | INTRAMUSCULAR | Status: DC
  Administered 2022-09-15: 2 mg via INTRAVENOUS
  Administered 2022-09-15: 3 mg via INTRAVENOUS
  Administered 2022-09-16 (×2): 2 mg via INTRAVENOUS
  Filled 2022-09-15 (×2): qty 1
  Filled 2022-09-15 (×2): qty 2

## 2022-09-15 MED ORDER — FLUOXETINE HCL 20 MG PO CAPS
20.0000 mg | ORAL_CAPSULE | Freq: Every day | ORAL | Status: DC
  Administered 2022-09-15: 20 mg via ORAL
  Filled 2022-09-15 (×2): qty 1

## 2022-09-15 MED ORDER — PANTOPRAZOLE SODIUM 40 MG IV SOLR
40.0000 mg | Freq: Two times a day (BID) | INTRAVENOUS | Status: DC
  Administered 2022-09-15: 40 mg via INTRAVENOUS
  Filled 2022-09-15: qty 10

## 2022-09-15 MED ORDER — MAGNESIUM HYDROXIDE 400 MG/5ML PO SUSP: 30.0000 mL | Freq: Every day | ORAL | Status: DC | PRN

## 2022-09-15 MED ORDER — LORAZEPAM 1 MG PO TABS: 0.0000 mg | ORAL_TABLET | Freq: Two times a day (BID) | ORAL | Status: DC

## 2022-09-15 MED ORDER — LORAZEPAM 2 MG/ML IJ SOLN: 0.0000 mg | Freq: Two times a day (BID) | INTRAMUSCULAR | Status: DC

## 2022-09-15 MED ORDER — PANTOPRAZOLE SODIUM 40 MG PO TBEC
40.0000 mg | DELAYED_RELEASE_TABLET | Freq: Two times a day (BID) | ORAL | Status: DC
  Administered 2022-09-15: 40 mg via ORAL
  Filled 2022-09-15 (×2): qty 1

## 2022-09-15 MED ORDER — POTASSIUM CHLORIDE IN NACL 20-0.9 MEQ/L-% IV SOLN
INTRAVENOUS | Status: DC
  Filled 2022-09-15: qty 1000

## 2022-09-15 MED ORDER — POTASSIUM CHLORIDE IN NACL 40-0.9 MEQ/L-% IV SOLN
INTRAVENOUS | Status: DC
  Filled 2022-09-15 (×4): qty 1000

## 2022-09-15 MED ORDER — LORAZEPAM 1 MG PO TABS
0.0000 mg | ORAL_TABLET | Freq: Four times a day (QID) | ORAL | Status: DC
  Administered 2022-09-15: 1 mg via ORAL
  Filled 2022-09-15: qty 1

## 2022-09-15 MED ORDER — ONDANSETRON HCL 4 MG/2ML IJ SOLN
4.0000 mg | Freq: Four times a day (QID) | INTRAMUSCULAR | Status: DC | PRN
  Administered 2022-09-15 (×3): 4 mg via INTRAVENOUS
  Filled 2022-09-15 (×3): qty 2

## 2022-09-15 NOTE — H&P (Signed)
[facility]   PATIENT NAME: Judy [name]    MR#:  [id number]  DATE OF BIRTH:  Dec 20, 1980  DATE OF ADMISSION:  09/14/2022  PRIMARY CARE PHYSICIAN: Patient, No Pcp Per   Patient is coming from: Home  REQUESTING/REFERRING PHYSICIAN: Ross Marcus, MD  CHIEF COMPLAINT:   Chief Complaint  Patient presents with   Drug / Alcohol Assessment    HISTORY OF PRESENT ILLNESS:  Judy [name] is a 42 y.o. female with medical history significant for anxiety, depression polysubstance abuse including alcohol and cocaine, who presented to the emergency room with acute onset of recurrent nausea, vomiting with diarrhea for the last 4 days with no bilious vomitus or hematemesis and no melena or bright red bleeding per rectum.  She did have any abdominal pain.  No chest pain or palpitations.  No dysuria, oliguria or hematuria, urgency or frequency or flank pain.  She drinks wine, beer and liquor.  Last drink was before she came to the ER.  She has a history of DTs.  She is willing to have detoxification this time though.  ED Course: When the came to the ER, vital signs were within normal.  Labs revealed hypokalemia of 2 with hypochloremia of 90, CO2 34 with otherwise unremarkable CMP.  Magnesium was 1.9.  High-sensitivity troponin I was 6 and later 5.  Lactic acid was 1.6 and later 1.4.  CBC showed hemoglobin of 10.9 and hematocrit 34.8.  Serum pregnancy test was negative.  Urine drug screen was positive for cocaine.  Alcohol level was 12 EKG as reviewed by me : EKG showed normal sinus rhythm with a rate of 66 with probable LVH Imaging: None.  The patient was given 40 mill equivalent p.o. potassium chloride and was placed on CIWA protocol.  She will be admitted to a medical telemetry bed for further evaluation and management. PAST MEDICAL HISTORY:   Past Medical History:  Diagnosis Date   Alcohol abuse    Anxiety    Cocaine abuse (HCC)    Depression    Depression    Polysubstance abuse  (HCC)     PAST SURGICAL HISTORY:  History reviewed. No pertinent surgical history.  SOCIAL HISTORY:   Social History   Tobacco Use   Smoking status: Every Day    Packs/day: 0.25    Years: 10.00    Additional pack years: 0.00    Total pack years: 2.50    Types: Cigarettes   Smokeless tobacco: Not on file  Substance Use Topics   Alcohol use: Yes    Comment: drinks vodka daily. Unable to articulate amount.    FAMILY HISTORY:   Positive for CVA and MI that she could remember. DRUG ALLERGIES:   Allergies  Allergen Reactions   Cephalosporins Hives   Penicillins Hives   Sulfa Antibiotics Nausea Only    REVIEW OF SYSTEMS:   ROS As per history of present illness. All pertinent systems were reviewed above. Constitutional, HEENT, cardiovascular, respiratory, GI, GU, musculoskeletal, neuro, psychiatric, endocrine, integumentary and hematologic systems were reviewed and are otherwise negative/unremarkable except for positive findings mentioned above in the HPI.   MEDICATIONS AT HOME:   Prior to Admission medications   Medication Sig Start Date End Date Taking? Authorizing Provider  FLUoxetine (PROZAC) 20 MG capsule Take 20 mg by mouth daily.    [provider]  medroxyPROGESTERone (DEPO-PROVERA) 150 MG/ML injection Inject into the muscle.    [provider]  QUEtiapine (SEROQUEL) 25 MG tablet  Take 25 mg by mouth 2 (two) times daily as needed (anxiety/sleep).  12/15/13   [provider]  valACYclovir (VALTREX) 500 MG tablet Take 500 mg by mouth daily. 12/15/13   [provider]      VITAL SIGNS:  Blood pressure 106/62, pulse 95, temperature 98.6 F (37 C), temperature source Oral, resp. rate 13, SpO2 100 %, unknown if currently breastfeeding.  PHYSICAL EXAMINATION:  Physical Exam  GENERAL:  42 y.o.-year-old African-American female patient lying in the bed with no acute distress.  She was fairly somnolent but arousable. EYES: Pupils  equal, round, reactive to light and accommodation. No scleral icterus. Extraocular muscles intact.  HEENT: Head atraumatic, normocephalic. Oropharynx and nasopharynx clear.  NECK:  Supple, no jugular venous distention. No thyroid enlargement, no tenderness.  LUNGS: Normal breath sounds bilaterally, no wheezing, rales,rhonchi or crepitation. No use of accessory muscles of respiration.  CARDIOVASCULAR: Regular rate and rhythm, S1, S2 normal. No murmurs, rubs, or gallops.  ABDOMEN: Soft, nondistended, nontender. Bowel sounds present. No organomegaly or mass.  EXTREMITIES: No pedal edema, cyanosis, or clubbing.  NEUROLOGIC: Cranial nerves II through XII are intact. Muscle strength 5/5 in all extremities. Sensation intact. Gait not checked.  PSYCHIATRIC: The patient is very somnolent but arousable.   SKIN: No obvious rash, lesion, or ulcer.   LABORATORY PANEL:   CBC Recent Labs  Lab 09/14/22 2226  WBC 6.7  HGB 10.9*  HCT 34.8*  PLT 326   ------------------------------------------------------------------------------------------------------------------  Chemistries  Recent Labs  Lab 09/14/22 2226 09/15/22 0052  NA 139  --   K 2.0*  --   CL 90*  --   CO2 34*  --   GLUCOSE 106*  --   BUN 8  --   CREATININE 0.91  --   CALCIUM 9.4  --   MG  --  1.9  AST 35  --   ALT 24  --   ALKPHOS 64  --   BILITOT 0.3  --    ------------------------------------------------------------------------------------------------------------------  Cardiac Enzymes No results for input(s): "TROPONINI" in the last 168 hours. ------------------------------------------------------------------------------------------------------------------  RADIOLOGY:  No results found.    IMPRESSION AND PLAN:  Assessment and Plan: * Hypokalemia - This is clearly secondary to intractable nausea, vomiting and diarrhea possibly with acute gastroenteritis. - The patient will be admitted to a medical telemetry bed. -  We will continue hydration with IV normal saline with added potassium chloride. - Will replace potassium orally as tolerated. - We will check magnesium level. - As needed antiemetics and antidiarrheals will be provided.  Alcohol dependence (HCC) - The patient is here for alcohol detoxification. - Given low alcohol level, will place on CIWA protocol. - Multivitamins, folic acid and thiamine will be given.  Polysubstance abuse (HCC) - This is including mainly cocaine, tobacco and alcohol. - The patient will be placed on as needed Ativan as mentioned above per CIWA protocol. - Her psychotropic medications will be continued as well. - NicoDerm CQ patch will be given as needed.  Depressive disorder - This could be associated with bipolar 1 disorder. - We will continue her Paxil, Lamictal, and aripiprazole as well as Wellbutrin XL and Seroquel.    DVT prophylaxis: Lovenox.  Advanced Care Planning:  Code Status: full code.  Family Communication:  The plan of care was discussed in details with the patient (and family). I answered all questions. The patient agreed to proceed with the above mentioned plan. Further management will depend upon hospital course. Disposition Plan:  Back to previous home environment Consults called: none.  All the records are reviewed and case discussed with ED provider.  Status is: Inpatient  At the time of the admission, it appears that the appropriate admission status for this patient is inpatient.  This is judged to be reasonable and necessary in order to provide the required intensity of service to ensure the patient's safety given the presenting symptoms, physical exam findings and initial radiographic and laboratory data in the context of comorbid conditions.  The patient requires inpatient status due to high intensity of service, high risk of further deterioration and high frequency of surveillance required.  I certify that at the time of admission, it is my  clinical judgment that the patient will require inpatient hospital care extending more than 2 midnights.                            Dispo: The patient is from: Home              Anticipated d/c is to: Home              Patient currently is not medically stable to d/c.              Difficult to place patient: No  Hannah Beat M.D on 09/15/2022 at 5:40 AM  [facility]   From 7 PM-7 AM, contact night-coverage [email or web address]  CC: Primary care physician; Patient, No Pcp Per

## 2022-09-15 NOTE — Assessment & Plan Note (Addendum)
-   This could be associated with bipolar 1 disorder. - We will continue her Paxil, Lamictal, and aripiprazole as well as Wellbutrin XL and Seroquel.

## 2022-09-15 NOTE — Assessment & Plan Note (Signed)
-   The patient is here for alcohol detoxification. - Given low alcohol level, will place on CIWA protocol. - Multivitamins, folic acid and thiamine will be given.

## 2022-09-15 NOTE — Assessment & Plan Note (Signed)
-   This is including mainly cocaine, tobacco and alcohol. - The patient will be placed on as needed Ativan as mentioned above per CIWA protocol. - Her psychotropic medications will be continued as well. - NicoDerm CQ patch will be given as needed.

## 2022-09-15 NOTE — Progress Notes (Signed)
Per H&P written by Dr. Arville Care.   Judy [name] is a 42 y.o. female with medical history significant for general anxiety/depression, polysubstance abuse including alcohol and cocaine, who presented to the emergency room with acute onset of recurrent nausea, vomiting with diarrhea for the last 4 days with no bilious vomitus or hematemesis and no melena or bright red blood per rectum.  She drinks wine, beer and liquor.  Last drink was before she came to the ER.  She has a history of DTs.  She is willing to have detoxification this time.  Workup in the ED revealed severe hypokalemia with serum potassium of 2.0 and alcohol intoxication with concern for alcohol withdrawal.  09/15/2022: The patient was seen and examined at bedside.  States she is hungry.  Nausea has improved.  No recurrent vomiting this morning.  No diarrhea at this time.  The patient is currently on potassium replacement and CIWA protocol.  At the time of this visit the patient is alert and oriented x 3. Her heart is regular rate and rhythm with no rubs or gallops. Her lungs are clear to auscultation with no wheezes or rales. She has no lower extremity edema on exam. Her mood is appropriate for condition and setting.  Time: 15 minutes.

## 2022-09-15 NOTE — Assessment & Plan Note (Signed)
-   This is clearly secondary to intractable nausea, vomiting and diarrhea possibly with acute gastroenteritis. - The patient will be admitted to a medical telemetry bed. - We will continue hydration with IV normal saline with added potassium chloride. - Will replace potassium orally as tolerated. - We will check magnesium level. - As needed antiemetics and antidiarrheals will be provided.

## 2022-09-15 NOTE — ED Provider Notes (Signed)
[place] EMERGENCY DEPARTMENT AT [facility] Provider Note   CSN: [id number] Arrival date & time: 09/14/22  2124     History  Chief Complaint  Patient presents with   Drug / Alcohol Assessment    Judy [name] is a 42 y.o. female.  HPI     This is a 42 year old female with a history of significant polysubstance abuse, alcohol abuse, hypokalemia who presents requesting detox.  Patient reports that she drinks all day every day.  She states that she drinks wine, beer, and liquor.  Last drink before she arrived here today.  Reports history of DTs in the past.  She states that she just moved to [place] so that "I can get out of my bad environment."  She states she generally does not feel good.  She did have some chest pain prior to arrival but does not have any active chest pain right now.  Denies shortness of breath.  Patient also reports ongoing cocaine abuse.  Home Medications Prior to Admission medications   Medication Sig Start Date End Date Taking? Authorizing Provider  FLUoxetine (PROZAC) 20 MG capsule Take 20 mg by mouth daily.    [provider]  medroxyPROGESTERone (DEPO-PROVERA) 150 MG/ML injection Inject into the muscle.    [provider]  QUEtiapine (SEROQUEL) 25 MG tablet Take 25 mg by mouth 2 (two) times daily as needed (anxiety/sleep).  12/15/13   [provider]  valACYclovir (VALTREX) 500 MG tablet Take 500 mg by mouth daily. 12/15/13   [provider]      Allergies    Cephalosporins, Penicillins, Sulfa antibiotics, and Zofran [ondansetron hcl]    Review of Systems   Review of Systems  Constitutional:  Negative for fever.  Respiratory:  Negative for shortness of breath.   Cardiovascular:  Positive for chest pain.  All other systems reviewed and are negative.   Physical Exam Updated Vital Signs BP 106/62   Pulse 95   Temp 98.6 F (37 C) (Oral)   Resp 13   SpO2 99%  Physical Exam Vitals and nursing  note reviewed.  Constitutional:      Appearance: She is well-developed.     Comments: Cachectic, chronically ill-appearing  HENT:     Head: Normocephalic and atraumatic.     Mouth/Throat:     Mouth: Mucous membranes are dry.  Eyes:     Pupils: Pupils are equal, round, and reactive to light.  Cardiovascular:     Rate and Rhythm: Normal rate and regular rhythm.     Heart sounds: Normal heart sounds.  Pulmonary:     Effort: Pulmonary effort is normal. No respiratory distress.     Breath sounds: No wheezing.  Abdominal:     Palpations: Abdomen is soft.     Tenderness: There is no abdominal tenderness.  Musculoskeletal:     Cervical back: Neck supple.  Skin:    General: Skin is warm and dry.  Neurological:     Mental Status: She is oriented to person, place, and time.  Psychiatric:        Mood and Affect: Mood normal.     ED Results / Procedures / Treatments   Labs (all labs ordered are listed, but only abnormal results are displayed) Labs Reviewed  COMPREHENSIVE METABOLIC PANEL - Abnormal; Notable for the following components:      Result Value   Potassium 2.0 (*)    Chloride 90 (*)    CO2 34 (*)  Glucose, Bld 106 (*)    All other components within normal limits  ETHANOL - Abnormal; Notable for the following components:   Alcohol, Ethyl (B) 12 (*)    All other components within normal limits  CBC - Abnormal; Notable for the following components:   Hemoglobin 10.9 (*)    HCT 34.8 (*)    MCH 25.2 (*)    RDW 16.8 (*)    All other components within normal limits  MAGNESIUM  LACTIC ACID, PLASMA  RAPID URINE DRUG SCREEN, HOSP PERFORMED  LACTIC ACID, PLASMA  I-STAT BETA HCG BLOOD, ED ([facility], [facility], [facility] ONLY)  TROPONIN I (HIGH SENSITIVITY)  TROPONIN I (HIGH SENSITIVITY)    EKG EKG Interpretation  Date/Time:  Tuesday September 15 2022 02:51:53 EDT Ventricular Rate:  66 PR Interval:  131 QRS Duration: 89 QT Interval:  416 QTC Calculation: 436 R Axis:   77 Text  Interpretation: Sinus rhythm Probable LVH with secondary repol abnrm Confirmed by Ross Marcus ([id number]) on 09/15/2022 2:59:29 AM  Radiology No results found.  Procedures .Critical Care  Performed by: Shon Baton, MD Authorized by: Shon Baton, MD   Critical care provider statement:    Critical care time (minutes):  45   Critical care was necessary to treat or prevent imminent or life-threatening deterioration of the following conditions:  Metabolic crisis (Hypokalemia, alcohol withdrawal)   Critical care was time spent personally by me on the following activities:  Development of treatment plan with patient or surrogate, discussions with consultants, evaluation of patient's response to treatment, examination of patient, ordering and review of laboratory studies, ordering and review of radiographic studies, ordering and performing treatments and interventions, pulse oximetry, re-evaluation of patient's condition and review of old charts     Medications Ordered in ED Medications  potassium chloride 10 mEq in 100 mL IVPB (has no administration in time range)  magnesium sulfate IVPB 2 g 50 mL (has no administration in time range)  potassium chloride SA (KLOR-CON M) CR tablet 40 mEq (has no administration in time range)  LORazepam (ATIVAN) injection 0-4 mg ( Intravenous See Alternative 09/15/22 0244)    Or  LORazepam (ATIVAN) tablet 0-4 mg (1 mg Oral Given 09/15/22 0244)  LORazepam (ATIVAN) injection 0-4 mg (has no administration in time range)    Or  LORazepam (ATIVAN) tablet 0-4 mg (has no administration in time range)  thiamine (VITAMIN B1) tablet 100 mg (has no administration in time range)    Or  thiamine (VITAMIN B1) injection 100 mg (has no administration in time range)    ED Course/ Medical Decision Making/ A&P                             Medical Decision Making Amount and/or Complexity of Data Reviewed Labs: ordered. Radiology: ordered.  Risk OTC  drugs. Prescription drug management. Decision regarding hospitalization.   This patient presents to the ED for concern of alcohol withdrawal/CP, this involves an extensive number of treatment options, and is a complaint that carries with it a high risk of complications and morbidity.  I considered the following differential and admission for this acute, potentially life threatening condition.  The differential diagnosis includes acute withdrawal, metabolic derangements, ACS, PE, pneumothorax, pneumonia  MDM:    This is a 42 year old female who presents requesting detox.  She has had some intermittent chest discomfort.  She is overall chronically ill-appearing and thin.  Reports drinking prior to arrival.  Reports daily drinking and history of DTs.  I have reviewed her chart.  Multiple prior admissions to a [facility] for similar presentation.  Also significant history of hypokalemia.  Today her potassium is 2.0.  EKG shows no evidence of acute arrhythmia or ischemia.  Troponin x 2 negative.  Magnesium is normal at 1.9.  Potassium was replaced IV and p.o.  Patient was placed on CIWA protocol.  Initial CIWA score 8.  Given history of DTs, she is at high risk.  Given significant metabolic derangement and history, will admit for ongoing evaluation.  (Labs, imaging, consults)  Labs: I Ordered, and personally interpreted labs.  The pertinent results include: CBC, BMP, troponin x 2  Imaging Studies ordered: I ordered imaging studies including patient refused x-ray I independently visualized and interpreted imaging. I agree with the radiologist interpretation  Additional history obtained from chart review.  External records from outside source obtained and reviewed including prior admissions to outside hospital  Cardiac Monitoring: The patient was maintained on a cardiac monitor.  If on the cardiac monitor, I personally viewed and interpreted the cardiac monitored which showed an underlying rhythm  of: Sinus rhythm  Reevaluation: After the interventions noted above, I reevaluated the patient and found that they have :stayed the same  Social Determinants of Health:  polysubstance abuse  Disposition: Admit  Co morbidities that complicate the patient evaluation  Past Medical History:  Diagnosis Date   Alcohol abuse    Anxiety    Cocaine abuse (HCC)    Depression    Depression    Polysubstance abuse (HCC)      Medicines Meds ordered this encounter  Medications   potassium chloride 10 mEq in 100 mL IVPB   magnesium sulfate IVPB 2 g 50 mL   potassium chloride SA (KLOR-CON M) CR tablet 40 mEq   OR Linked Order Group    LORazepam (ATIVAN) injection 0-4 mg     Order Specific Question:   CIWA-AR < 5 =     Answer:   0 mg     Order Specific Question:   CIWA-AR 5 -10 =     Answer:   1 mg     Order Specific Question:   CIWA-AR 11 -15 =     Answer:   2 mg     Order Specific Question:   CIWA-AR 16 -20 =     Answer:   3 mg     Order Specific Question:   CIWA-AR 16 -20 =     Answer:   Recheck CIWA-AR in 1 hour; if > 20 notify MD     Order Specific Question:   CIWA-AR > 20 =     Answer:   4 mg     Order Specific Question:   CIWA-AR > 20 =     Answer:   Notify MD    LORazepam (ATIVAN) tablet 0-4 mg     Order Specific Question:   CIWA-AR < 5 =     Answer:   0 mg     Order Specific Question:   CIWA-AR 5 -10 =     Answer:   1 mg     Order Specific Question:   CIWA-AR 11 -15 =     Answer:   2 mg     Order Specific Question:   CIWA-AR 16 -20 =     Answer:   3 mg     Order Specific Question:   CIWA-AR 16 -20 =  Answer:   Recheck CIWA-AR in 1 hour; if > 20 notify MD     Order Specific Question:   CIWA-AR > 20 =     Answer:   4 mg     Order Specific Question:   CIWA-AR > 20 =     Answer:   Notify MD   OR Linked Order Group    LORazepam (ATIVAN) injection 0-4 mg     Order Specific Question:   CIWA-AR < 5 =     Answer:   0 mg     Order Specific Question:   CIWA-AR 5 -10 =      Answer:   1 mg     Order Specific Question:   CIWA-AR 11 -15 =     Answer:   2 mg     Order Specific Question:   CIWA-AR 16 -20 =     Answer:   3 mg     Order Specific Question:   CIWA-AR 16 -20 =     Answer:   Recheck CIWA-AR in 1 hour; if > 20 notify MD     Order Specific Question:   CIWA-AR > 20 =     Answer:   4 mg     Order Specific Question:   CIWA-AR > 20 =     Answer:   Notify MD    LORazepam (ATIVAN) tablet 0-4 mg     Order Specific Question:   CIWA-AR < 5 =     Answer:   0 mg     Order Specific Question:   CIWA-AR 5 -10 =     Answer:   1 mg     Order Specific Question:   CIWA-AR 11 -15 =     Answer:   2 mg     Order Specific Question:   CIWA-AR 16 -20 =     Answer:   3 mg     Order Specific Question:   CIWA-AR 16 -20 =     Answer:   Recheck CIWA-AR in 1 hour; if > 20 notify MD     Order Specific Question:   CIWA-AR > 20 =     Answer:   4 mg     Order Specific Question:   CIWA-AR > 20 =     Answer:   Notify MD   OR Linked Order Group    thiamine (VITAMIN B1) tablet 100 mg    thiamine (VITAMIN B1) injection 100 mg    I have reviewed the patients home medicines and have made adjustments as needed  Problem List / ED Course: Problem List Items Addressed This Visit   None Visit Diagnoses     Polysubstance abuse (HCC)    -  Primary   Hypokalemia                       Final Clinical Impression(s) / ED Diagnoses Final diagnoses:  Polysubstance abuse (HCC)  Hypokalemia    Rx / DC Orders ED Discharge Orders     None         Quintana Canelo, Mayer Masker, MD 09/15/22 0302

## 2022-09-15 NOTE — Progress Notes (Signed)
Patient asked for more crackers and peanut butter. RN informed patient there are currently no more at this time. RN attempted to educate patient that the floor snacks are for all patients. Patient became agitated and began yelling "I am homeless and this is what my medicaid pays for!" RN demonstrated boundaries and that if she continued to yell the RN would walk out of the room. Patient stating "Get out of my room, I don't care." RN made sure call bell was in reach and that there were no needs at this time.

## 2022-09-15 NOTE — ED Notes (Signed)
ED TO INPATIENT HANDOFF REPORT  ED Nurse Name and Phone #:  Sydney [phone]  S Name/Age/Gender Judy [name] 42 y.o. female Room/Bed: 023C/023C  Code Status   Code Status: Full Code  Home/SNF/Other Home Patient oriented to: self, place, time, and situation Is this baseline? Yes   Triage Complete: Triage complete  Chief Complaint Hypokalemia [E87.6]  Triage Note Pt to ED with complaint of CP. Also requesting detox from alcohol and cocaine. Last drink tonight - drinks approx per day per patient. Last cocaine use last evening.    Allergies Allergies  Allergen Reactions   Cephalosporins Hives   Penicillins Hives   Sulfa Antibiotics Nausea Only    Level of Care/Admitting Diagnosis ED Disposition     ED Disposition  Admit   Condition  --   Comment  Hospital Area: [facility] [[id number]]  Level of Care: Telemetry Medical [104]  May admit patient to [facility] or [facility] if equivalent level of care is available:: No  Covid Evaluation: Asymptomatic - no recent exposure (last 10 days) testing not required  Diagnosis: Hypokalemia [172180]  Admitting Physician: Hannah Beat [[id number]]  Attending Physician: Hannah Beat [[id number]]  Certification:: I certify this patient will need inpatient services for at least 2 midnights  Estimated Length of Stay: 2          B Medical/Surgery History Past Medical History:  Diagnosis Date   Alcohol abuse    Anxiety    Cocaine abuse (HCC)    Depression    Depression    Polysubstance abuse (HCC)    History reviewed. No pertinent surgical history.   A IV Location/Drains/Wounds Patient Lines/Drains/Airways Status     Active Line/Drains/Airways     Name Placement date Placement time Site Days   Peripheral IV 09/15/22 20 G 1.88" Left Forearm 09/15/22  0343  Forearm  less than 1            Intake/Output Last 24 hours No intake or output data in the 24 hours ending 09/15/22  0710  Labs/Imaging Results for orders placed or performed during the hospital encounter of 09/14/22 (from the past 48 hour(s))  Comprehensive metabolic panel     Status: Abnormal   Collection Time: 09/14/22 10:26 PM  Result Value Ref Range   Sodium 139 135 - 145 mmol/L   Potassium 2.0 (LL) 3.5 - 5.1 mmol/L    Comment: CRITICAL RESULT CALLED TO, READ BACK BY AND VERIFIED WITH BERRIER, C. RN @ 0005 09/15/22 JBUTLER   Chloride 90 (L) 98 - 111 mmol/L   CO2 34 (H) 22 - 32 mmol/L   Glucose, Bld 106 (H) 70 - 99 mg/dL    Comment: Glucose reference range applies only to samples taken after fasting for at least 8 hours.   BUN 8 6 - 20 mg/dL   Creatinine, Ser 8.41 0.44 - 1.00 mg/dL   Calcium 9.4 8.9 - 32.4 mg/dL   Total Protein 6.9 6.5 - 8.1 g/dL   Albumin 3.5 3.5 - 5.0 g/dL   AST 35 15 - 41 U/L   ALT 24 0 - 44 U/L   Alkaline Phosphatase 64 38 - 126 U/L   Total Bilirubin 0.3 0.3 - 1.2 mg/dL   GFR, Estimated >40 >10 mL/min    Comment: (NOTE) Calculated using the CKD-EPI Creatinine Equation (2021)    Anion gap 15 5 - 15    Comment: Performed at [facility], [address], [place], [place]  [address]  cbc     Status: Abnormal   Collection Time: 09/14/22 10:26 PM  Result Value Ref Range   WBC 6.7 4.0 - 10.5 K/uL   RBC 4.33 3.87 - 5.11 MIL/uL   Hemoglobin 10.9 (L) 12.0 - 15.0 g/dL   HCT 60.4 (L) 54.0 - 98.1 %   MCV 80.4 80.0 - 100.0 fL   MCH 25.2 (L) 26.0 - 34.0 pg   MCHC 31.3 30.0 - 36.0 g/dL   RDW 19.1 (H) 47.8 - 29.5 %   Platelets 326 150 - 400 K/uL   nRBC 0.0 0.0 - 0.2 %    Comment: Performed at [facility], [address] [place], [place] [address]  Troponin I (High Sensitivity)     Status: None   Collection Time: 09/14/22 10:26 PM  Result Value Ref Range   Troponin I (High Sensitivity) 6 <18 ng/L    Comment: (NOTE) Elevated high sensitivity troponin I (hsTnI) values and significant  changes across serial measurements may suggest ACS but many other  chronic and  acute conditions are known to elevate hsTnI results.  Refer to the "Links" section for chest pain algorithms and additional  guidance. Performed at [facility], [address] [place], [place] [address]   I-Stat beta hCG blood, ED     Status: None   Collection Time: 09/14/22 10:48 PM  Result Value Ref Range   I-stat hCG, quantitative <5.0 <5 mIU/mL   Comment 3            Comment:   GEST. AGE      CONC.  (mIU/mL)   <=1 WEEK        5 - 50     2 WEEKS       50 - 500     3 WEEKS       100 - 10,000     4 WEEKS     1,000 - 30,000        FEMALE AND NON-PREGNANT FEMALE:     LESS THAN 5 mIU/mL   Ethanol     Status: Abnormal   Collection Time: 09/15/22 12:52 AM  Result Value Ref Range   Alcohol, Ethyl (B) 12 (H) <10 mg/dL    Comment: (NOTE) Lowest detectable limit for serum alcohol is 10 mg/dL.  For medical purposes only. Performed at [facility], [address], [place], [place] [address]   Troponin I (High Sensitivity)     Status: None   Collection Time: 09/15/22 12:52 AM  Result Value Ref Range   Troponin I (High Sensitivity) 5 <18 ng/L    Comment: (NOTE) Elevated high sensitivity troponin I (hsTnI) values and significant  changes across serial measurements may suggest ACS but many other  chronic and acute conditions are known to elevate hsTnI results.  Refer to the "Links" section for chest pain algorithms and additional  guidance. Performed at [facility], [address], [place], [place] [address]   Magnesium     Status: None   Collection Time: 09/15/22 12:52 AM  Result Value Ref Range   Magnesium 1.9 1.7 - 2.4 mg/dL    Comment: Performed at [facility], [address] [place], [place] [address]  Lactic acid, plasma     Status: None   Collection Time: 09/15/22 12:52 AM  Result Value Ref Range   Lactic Acid, Venous 1.6 0.5 - 1.9 mmol/L    Comment: Performed at [facility]  Lab, [address] [place], [place] [address]  Lactic acid,  plasma     Status: None   Collection Time: 09/15/22  3:18 AM  Result Value Ref Range   Lactic Acid, Venous 1.4 0.5 - 1.9 mmol/L    Comment: Performed at [facility], [address], [place], [place] [address]   No results found.  Pending Labs Unresulted Labs (From admission, onward)     Start     Ordered   09/15/22 0540  CBC  Once,   R        09/15/22 0540   09/15/22 0540  Basic metabolic panel  Once,   R        09/15/22 0540   09/15/22 0353  HIV Antibody (routine testing w rflx)  (HIV Antibody (Routine testing w reflex) panel)  Once,   R        09/15/22 0353   09/14/22 2226  Rapid urine drug screen (hospital performed)  Once,   STAT        09/14/22 2227            Vitals/Pain Today's Vitals   09/15/22 0142 09/15/22 0238 09/15/22 0509 09/15/22 0616  BP: 106/62   105/68  Pulse: 95   74  Resp:    13  Temp:  98.6 F (37 C)  98.9 F (37.2 C)  TempSrc:  Oral  Oral  SpO2:   100% 100%  PainSc:        Isolation Precautions No active isolations  Medications Medications  potassium chloride 10 mEq in 100 mL IVPB (10 mEq Intravenous Not Given 09/15/22 0523)  LORazepam (ATIVAN) injection 0-4 mg ( Intravenous See Alternative 09/15/22 0244)    Or  LORazepam (ATIVAN) tablet 0-4 mg (1 mg Oral Given 09/15/22 0244)  LORazepam (ATIVAN) injection 0-4 mg (has no administration in time range)    Or  LORazepam (ATIVAN) tablet 0-4 mg (has no administration in time range)  thiamine (VITAMIN B1) tablet 100 mg (has no administration in time range)    Or  thiamine (VITAMIN B1) injection 100 mg (has no administration in time range)  FLUoxetine (PROZAC) capsule 20 mg (has no administration in time range)  QUEtiapine (SEROQUEL) tablet 25 mg (has no administration in time range)  enoxaparin (LOVENOX) injection 40 mg (has no administration in time range)  0.9 % NaCl with KCl 20 mEq/ L  infusion ( Intravenous New Bag/Given 09/15/22 0502)  acetaminophen (TYLENOL) tablet 650 mg (has no  administration in time range)    Or  acetaminophen (TYLENOL) suppository 650 mg (has no administration in time range)  traZODone (DESYREL) tablet 25 mg (has no administration in time range)  magnesium hydroxide (MILK OF MAGNESIA) suspension 30 mL (has no administration in time range)  ondansetron (ZOFRAN) tablet 4 mg ( Oral See Alternative 09/15/22 0453)    Or  ondansetron (ZOFRAN) injection 4 mg (4 mg Intravenous Given 09/15/22 0453)  pantoprazole (PROTONIX) injection 40 mg (has no administration in time range)  loperamide (IMODIUM) capsule 2 mg (has no administration in time range)  magnesium sulfate IVPB 2 g 50 mL (0 g Intravenous Stopped 09/15/22 0710)  potassium chloride SA (KLOR-CON M) CR tablet 40 mEq (40 mEq Oral Given 09/15/22 0503)    Mobility walks       R Recommendations: See Admitting Provider Note  Report given to:   Additional Notes: Pt is A&Ox4, continentx2, ambulatory.

## 2022-09-16 DIAGNOSIS — E876 Hypokalemia: Secondary | ICD-10-CM | POA: Diagnosis not present

## 2022-09-16 LAB — TSH: TSH: 0.353 u[IU]/mL (ref 0.350–4.500)

## 2022-09-16 LAB — PHOSPHORUS: Phosphorus: 2.2 mg/dL — ABNORMAL LOW (ref 2.5–4.6)

## 2022-09-16 LAB — VITAMIN B12: Vitamin B-12: 198 pg/mL (ref 180–914)

## 2022-09-16 MED ORDER — LORAZEPAM 2 MG/ML IJ SOLN
4.0000 mg | Freq: Once | INTRAMUSCULAR | Status: DC
  Filled 2022-09-16: qty 2

## 2022-09-16 MED ORDER — SENNOSIDES-DOCUSATE SODIUM 8.6-50 MG PO TABS: 1.0000 | ORAL_TABLET | Freq: Every evening | ORAL | Status: DC | PRN

## 2022-09-16 MED ORDER — PHENOBARBITAL 32.4 MG PO TABS: 64.8000 mg | ORAL_TABLET | Freq: Three times a day (TID) | ORAL | Status: DC

## 2022-09-16 MED ORDER — POTASSIUM CHLORIDE IN NACL 40-0.9 MEQ/L-% IV SOLN
INTRAVENOUS | Status: DC
  Filled 2022-09-16: qty 1000

## 2022-09-16 MED ORDER — HYDRALAZINE HCL 20 MG/ML IJ SOLN: 10.0000 mg | INTRAMUSCULAR | Status: DC | PRN

## 2022-09-16 MED ORDER — LORAZEPAM 2 MG/ML IJ SOLN: 1.0000 mg | INTRAMUSCULAR | Status: DC | PRN

## 2022-09-16 MED ORDER — IPRATROPIUM-ALBUTEROL 0.5-2.5 (3) MG/3ML IN SOLN: 3.0000 mL | RESPIRATORY_TRACT | Status: DC | PRN

## 2022-09-16 MED ORDER — METOPROLOL TARTRATE 5 MG/5ML IV SOLN: 5.0000 mg | INTRAVENOUS | Status: DC | PRN

## 2022-09-16 MED ORDER — POTASSIUM CHLORIDE 10 MEQ/100ML IV SOLN
10.0000 meq | INTRAVENOUS | Status: DC
  Filled 2022-09-16: qty 100

## 2022-09-16 MED ORDER — PHENOBARBITAL 32.4 MG PO TABS: 32.4000 mg | ORAL_TABLET | Freq: Three times a day (TID) | ORAL | Status: DC

## 2022-09-16 NOTE — Discharge Summary (Signed)
Physician Discharge Summary  Judy [name] ZOX:[id number] DOB: Apr 14, 1980 DOA: 09/14/2022  PCP: Patient, No Pcp Per  Admit date: 09/14/2022 Discharge date: 09/16/2022  Admitted From: Home Disposition: Left AGAINST MEDICAL ADVICE  Recommendations for Outpatient Follow-up:  Left AGAINST MEDICAL ADVICE   Brief/Interim Summary:   42 year old with history of anxiety, depression, alcohol abuse/cocaine use comes to the ER with recurrent nausea and vomiting.  Upon admission he was noted to have severe electrolyte abnormality, clinical dehydration.  UDS positive for cocaine and negative alcohol level. Upon admission patient had started on electrolyte repletion.  Following morning patient was quite frustrated and at times agitated wanting to leave the hospital.  When I saw the patient at bedside she did not have any complaints besides wanting to leave the hospital.  She was alert awake oriented X4 anyone understood that leaving hospital without appropriate treatment can further deteriorate her condition and even cause death.  RN present at bedside with me during my encounter.     Assessment & Plan:  Principal Problem:   Hypokalemia Active Problems:   Alcohol dependence (HCC)   Polysubstance abuse (HCC)   Depressive disorder       Assessment and Plan: * Hypokalemia - Aggressive repletion of electrolytes.  Check magnesium and phosphorus levels.   Alcohol dependence (HCC) Severe at [facility] and psychosis - Alcohol withdrawal protocol currently in place.  Undergoing severe withdrawal.  Continue folic acid, multivitamin and thiamine.  Currently on PPI. -Phenobarb protocol as patient is severely agitated   Polysubstance abuse (HCC) - This is including mainly cocaine, tobacco and alcohol. - Currently on withdrawal protocol.   Depressive disorder - This could be associated with bipolar 1 disorder. - We will continue her Paxil, Lamictal, and aripiprazole as well as Wellbutrin XL and  Seroquel.       Discharge Diagnoses:  Principal Problem:   Hypokalemia Active Problems:   Alcohol dependence (HCC)   Polysubstance abuse (HCC)   Depressive disorder      Consultations: Offered chaplain consult and psychiatry  Subjective: Seen and minute bedside with RN with me in the room.  Patient did not have any complaints and was very agitated that she had to be in the hospital.  Patient insisted he wants to leave the hospital without any further medical treatment.  I explained the risks and benefits and despite of this she wants to leave AGAINST MEDICAL ADVICE.  Patient is alert awake oriented X4.  Discharge Exam: Vitals:   09/16/22 0558 09/16/22 0803  BP: (!) 101/58 95/72  Pulse: 74 75  Resp: 18 20  Temp:    SpO2: 100% 100%   Vitals:   09/15/22 2050 09/16/22 0119 09/16/22 0558 09/16/22 0803  BP: 104/68 101/65 (!) 101/58 95/72  Pulse: 72 71 74 75  Resp: 18 19 18 20   Temp: 98.3 F (36.8 C)     TempSrc:      SpO2: 99% 100% 100% 100%    General: Pt is alert, awake, not in acute distress Cardiovascular: RRR, S1/S2 +, no rubs, no gallops Respiratory: CTA bilaterally, no wheezing, no rhonchi Abdominal: Soft, NT, ND, bowel sounds + Extremities: no edema, no cyanosis  Discharge Instructions     Allergies  Allergen Reactions   Metoclopramide Other (See Comments) and Nausea And Vomiting    jittery   Cephalosporins Hives   Penicillins Hives   Sulfa Antibiotics Nausea Only    You were cared for by a hospitalist during your hospital stay. If you have any questions  about your discharge medications or the care you received while you were in the hospital after you are discharged, you can call the unit and asked to speak with the hospitalist on call if the hospitalist that took care of you is not available. Once you are discharged, your primary care physician will handle any further medical issues. Please note that no refills for any discharge medications will be  authorized once you are discharged, as it is imperative that you return to your primary care physician (or establish a relationship with a primary care physician if you do not have one) for your aftercare needs so that they can reassess your need for medications and monitor your lab values.  You were cared for by a hospitalist during your hospital stay. If you have any questions about your discharge medications or the care you received while you were in the hospital after you are discharged, you can call the unit and asked to speak with the hospitalist on call if the hospitalist that took care of you is not available. Once you are discharged, your primary care physician will handle any further medical issues. Please note that NO REFILLS for any discharge medications will be authorized once you are discharged, as it is imperative that you return to your primary care physician (or establish a relationship with a primary care physician if you do not have one) for your aftercare needs so that they can reassess your need for medications and monitor your lab values.  Please request your Prim.MD to go over all Hospital Tests and Procedure/Radiological results at the follow up, please get all Hospital records sent to your Prim MD by signing hospital release before you go home.  Get CBC, CMP, 2 view Chest X ray checked  by Primary MD during your next visit or SNF MD in 5-7 days ( we routinely change or add medications that can affect your baseline labs and fluid status, therefore we recommend that you get the mentioned basic workup next visit with your PCP, your PCP may decide not to get them or add new tests based on their clinical decision)  On your next visit with your primary care physician please Get Medicines reviewed and adjusted.  If you experience worsening of your admission symptoms, develop shortness of breath, life threatening emergency, suicidal or homicidal thoughts you must seek medical attention  immediately by calling 911 or calling your MD immediately  if symptoms less severe.  You Must read complete instructions/literature along with all the possible adverse reactions/side effects for all the Medicines you take and that have been prescribed to you. Take any new Medicines after you have completely understood and accpet all the possible adverse reactions/side effects.   Do not drive, operate heavy machinery, perform activities at heights, swimming or participation in water activities or provide baby sitting services if your were admitted for syncope or siezures until you have seen by Primary MD or a Neurologist and advised to do so again.  Do not drive when taking Pain medications.   Procedures/Studies: No results found.   The results of significant diagnostics from this hospitalization (including imaging, microbiology, ancillary and laboratory) are listed below for reference.     Microbiology: No results found for this or any previous visit (from the past 240 hour(s)).   Labs: BNP (last 3 results) No results for input(s): "BNP" in the last 8760 hours. Basic Metabolic Panel: Recent Labs  Lab 09/14/22 2226 09/15/22 0052 09/15/22 0318 09/16/22 0845  NA 139  --  137  --   K 2.0*  --  2.5*  --   CL 90*  --  91*  --   CO2 34*  --  34*  --   GLUCOSE 106*  --  80  --   BUN 8  --  10  --   CREATININE 0.91  --  0.88  --   CALCIUM 9.4  --  9.3  --   MG  --  1.9  --   --   PHOS  --   --   --  2.2*   Liver Function Tests: Recent Labs  Lab 09/14/22 2226  AST 35  ALT 24  ALKPHOS 64  BILITOT 0.3  PROT 6.9  ALBUMIN 3.5   No results for input(s): "LIPASE", "AMYLASE" in the last 168 hours. No results for input(s): "AMMONIA" in the last 168 hours. CBC: Recent Labs  Lab 09/14/22 2226 09/15/22 0318  WBC 6.7 6.2  HGB 10.9* 11.4*  HCT 34.8* 36.1  MCV 80.4 81.5  PLT 326 311   Cardiac Enzymes: No results for input(s): "CKTOTAL", "CKMB", "CKMBINDEX", "TROPONINI" in the  last 168 hours. BNP: Invalid input(s): "POCBNP" CBG: No results for input(s): "GLUCAP" in the last 168 hours. D-Dimer No results for input(s): "DDIMER" in the last 72 hours. Hgb A1c No results for input(s): "HGBA1C" in the last 72 hours. Lipid Profile No results for input(s): "CHOL", "HDL", "LDLCALC", "TRIG", "CHOLHDL", "LDLDIRECT" in the last 72 hours. Thyroid function studies Recent Labs    09/16/22 0846  TSH 0.353   Anemia work up Recent Labs    09/16/22 0845  VITAMINB12 198   Urinalysis    Component Value Date/Time   COLORURINE YELLOW 03/25/2014 0830   APPEARANCEUR CLEAR 03/25/2014 0830   APPEARANCEUR Clear 03/25/2014 0027   LABSPEC 1.017 03/25/2014 0830   LABSPEC 1.020 03/25/2014 0027   PHURINE 5.5 03/25/2014 0830   GLUCOSEU NEGATIVE 03/25/2014 0830   GLUCOSEU Negative 03/25/2014 0027   HGBUR NEGATIVE 03/25/2014 0830   BILIRUBINUR NEGATIVE 03/25/2014 0830   BILIRUBINUR Negative 03/25/2014 0027   KETONESUR 15 (A) 03/25/2014 0830   PROTEINUR NEGATIVE 03/25/2014 0830   UROBILINOGEN 0.2 03/25/2014 0830   NITRITE NEGATIVE 03/25/2014 0830   LEUKOCYTESUR NEGATIVE 03/25/2014 0830   LEUKOCYTESUR Negative 03/25/2014 0027   Sepsis Labs Recent Labs  Lab 09/14/22 2226 09/15/22 0318  WBC 6.7 6.2   Microbiology No results found for this or any previous visit (from the past 240 hour(s)).   Time coordinating discharge:  I have spent 35 minutes face to face with the patient and on the ward discussing the patients care, assessment, plan and disposition with other care givers. >50% of the time was devoted counseling the patient about the risks and benefits of treatment/Discharge disposition and coordinating care.   SIGNED:   Dimple Nanas, MD  [facility] 09/16/2022, 1:25 PM   If 7PM-7AM, please contact night-coverage

## 2022-09-16 NOTE — Progress Notes (Signed)
Change lorazepam CIWA to phenobarbital protocol for ETOH withdrawal per Dr. Nelson Chimes.  Ulyses Southward, PharmD, BCIDP, AAHIVP, CPP Infectious Disease Pharmacist 09/16/2022 8:21 AM

## 2022-09-16 NOTE — Progress Notes (Signed)
Patient left AMA at 1055am. MD and nurse staff tried to reason with her before she left. IV was removed. All belongings given to her

## 2022-09-16 NOTE — Consult Note (Signed)
  Psych consult received for intermittent psychosis and substance use. Patient left AMA, prior to seeing. From what I can see in chart review she is not of imminent danger to self or others. Historically she has a strong history of substance induced psychosis, that resolves following detoxification. Chart review further shows that patient will leave AMA after getting food and respite. I do not feel she meets criteria for IVC, after conducting chart review. Her risk to harm self or others has been fairly low across all ER visits and admissions. SHe is very well known to [facility] (9 ER visits and 2 admissions) 12 month .   Please re consult if patient returns.

## 2022-09-22 ENCOUNTER — Ambulatory Visit (HOSPITAL_COMMUNITY)
Admission: EM | Admit: 2022-09-22 | Discharge: 2022-09-22 | Disposition: A | Discharge status: Home / Self Care | Payer: MEDICAID | Attending: Psychiatry | Admitting: Psychiatry

## 2022-09-22 DIAGNOSIS — F1020 Alcohol dependence, uncomplicated: Secondary | ICD-10-CM | POA: Insufficient documentation

## 2022-09-22 DIAGNOSIS — F3132 Bipolar disorder, current episode depressed, moderate: Secondary | ICD-10-CM | POA: Insufficient documentation

## 2022-09-22 DIAGNOSIS — F1410 Cocaine abuse, uncomplicated: Secondary | ICD-10-CM | POA: Insufficient documentation

## 2022-09-22 DIAGNOSIS — F1090 Alcohol use, unspecified, uncomplicated: Secondary | ICD-10-CM

## 2022-09-22 NOTE — ED Provider Notes (Signed)
Behavioral Health Urgent Care Medical Screening Exam  Patient Name: Judy [name] MRN: [id number] Date of Evaluation: 09/22/22 Chief Complaint:   Diagnosis:  Final diagnoses:  Alcohol use disorder  Cocaine abuse (HCC)  Bipolar affective disorder, currently depressed, moderate (HCC)    History of Present illness: Judy [name] is a 42 y.o. female with a past psychiatric history of reported bipolar disorder, alcohol use disorder, and cocaine abuse with multiple hospitalizations who presents with her church friend Judy [name] seeking substance use treatment.  Patient is wanting to go to [facility] for rehabilitation services.  Patient is unclear regarding when she last used cocaine and alcohol, initially stating she last used Thursday night however would also state that she used 2 days ago and also stated she used cocaine daily.    Patient also was recently hospitalized due to hypokalemia and left AMA.  Provider suggested patient obtain a CMP to check potassium levels but patient refused at this time and stated she is currently taking potassium at home.  Patient also is refusing to do a UDS and when it was discussed that at [facility], they would also do a UDS there, she became unsure as to when the most recent time she used substances or drink alcohol was.  She reports withdrawal symptoms including tremor, diaphoresis, and anxiety.  However, her behavior was calm and no tremors were noted on physical exam.  Patient does not want to stay in [facility] voluntarily.  She denies SI, HI, and AVH.  Flowsheet Row ED from 09/22/2022 in [facility] ED to Hosp-Admission (Discharged) from 09/14/2022 in [facility]  C-SSRS RISK CATEGORY No Risk No Risk       Psychiatric Specialty Exam  Presentation  General Appearance:Disheveled  Eye Contact:Minimal  Speech:Garbled  Speech Volume:Normal  Handedness:Right   Mood and Affect  Mood:Dysphoric;  Labile  Affect:Congruent   Thought Process  Thought Processes:Coherent  Descriptions of Associations:Intact  Orientation:Full (Time, Place and Person)  Thought Content:Logical    Hallucinations:None  Ideas of Reference:None  Suicidal Thoughts:No  Homicidal Thoughts:No   Sensorium  Memory:Immediate Fair; Recent Fair; Remote Fair  Judgment:Poor  Insight:Poor   Executive Functions  Concentration:Fair  Attention Span:Fair  Recall:Poor  Fund of Knowledge:Poor  Language:Fair   Psychomotor Activity  Psychomotor Activity:Normal   Assets  Assets:Social Support   Sleep  Sleep:Fair   Physical Exam: Patient refused physical exam.   Blood pressure 95/63, pulse 78, temperature 98.2 F (36.8 C), temperature source Oral, resp. rate 17, SpO2 100 %, unknown if currently breastfeeding. There is no height or weight on file to calculate BMI.  Musculoskeletal: Strength & Muscle Tone: within normal limits Gait & Station: normal Patient leans: N/A   [facility] MSE Discharge Disposition for Follow up and Recommendations: Judy [name] is a 42 year old female with a past psychiatric history of reported bipolar disorder, cocaine abuse, and alcohol use disorder who presents to [facility] for substance use treatment.  Patient has a long history of hospitalizations and leaving AMA.  She was recently at [facility] on 6/24 due to hypokalemia in which there was aggressive repletion of her electrolytes; however, she still left AMA.    At this time, patient is refusing a physical exam and lab work to assess for hypokalemia and substances in her system.  Patient does not meet criteria for IVC as she is not suicidal, homicidal, or psychotic.  Patient also does not meet criteria for FBC due to not wanting to  voluntarily complete detox in this facility.  Patient did state that she has abstained from alcohol and substances since last Thursday so in that case, we recommended patient to go to  [facility] as if this is fact, she would be able to stay at [facility].  However patient has many socioeconomic barriers such as housing instability, disability, lack of social support, and chronic substance use that puts her at high risk of relapsing on alcohol and cocaine.  Patient was discharged with instructions to go to [facility] for further substance use treatment.   Lance Muss, MD 09/22/2022, 3:41 PM

## 2022-09-22 NOTE — Discharge Instructions (Addendum)
Follow-up recommendations:  Activity:  Normal, as tolerated Diet:  Per PCP recommendation  Patient is instructed prior to discharge to: Take all medications as prescribed by her mental healthcare provider. Report any adverse effects and/or reactions from the medicines to her outpatient provider promptly. Patient has been instructed & cautioned: To not engage in alcohol and or illegal drug use while on prescription medicines.  In the event of worsening symptoms, patient is instructed to call the crisis hotline at 988, 911 and or go to the nearest ED for appropriate evaluation and treatment of symptoms. To follow-up with her primary care provider for your other medical issues, concerns and or health care needs.  

## 2022-09-22 NOTE — ED Notes (Signed)
Patient Is discharging at this time. Printed AVS reviewed with patient by provider along with follow up appointments and resources. Patient denies SI, HI, and A/V/H. Valuables/belongings returned to patient. No s/s of current distress.  

## 2022-09-22 NOTE — Progress Notes (Signed)
   09/22/22 1354  [facility] Triage Screening (Walk-ins at [facility] only)  How Did You Hear About [place]? Other (Comment) ("Church friends")  What Is the Reason for Your Visit/Call Today? Judy [name] is a 42 y/o female presenting to the [facility]. She is accompanied by "a church friend" -[name]. Patient states that she is struggling with alcohol and drug addiction; also depression. Patient states that she binges on alcohol and drugs "all day, every day". Patient started drinking at the age of 42 years old. However, at the age age of 42 years old her alcohol use increased. Followed by using crack cocaine at the age of 42 years old. She has struggled with cocaine use for the past 24 years. Last use for alcohol was 09/21/2022, "1 bottle of wine". Last use for cocaine was 09/20/2022. She has current withdrawal symptoms that consist of "shakes, tremors, agitation, skin crawling, anxiety, sweats, cold chills". Denies a history of seizures. She does report a history of DT's and says she had been medically admitted several times. She has a history of detox/treatment over the course of 24 years. Her longest period of sobriety is one year. Patient denies SI, HI, and AVH's. She is homeless at this time and interested in inpatient treatment.  How Long Has This Been Causing You Problems? > than 6 months  Have You Recently Had Any Thoughts About Hurting Yourself? No  Are You Planning to Commit Suicide/Harm Yourself At This time? No  Have you Recently Had Thoughts About Hurting Someone [name]? No  Are You Planning To Harm Someone At This Time? No  Are you currently experiencing any auditory, visual or other hallucinations? No  Have You Used Any Alcohol or Drugs in the Past 24 Hours? Yes  How long ago did you use Drugs or Alcohol? Patient states that she binges on alcohol and drugs "all day, every day". Patient started drinking at the age of 42 years old. However, at the age age of 42 years old her alcohol use increased. Followed by  using crack cocaine at the age of 43 years old. She has struggled with cocaine use for the past 24 years. Last use for alcohol was 09/21/2022, "1 bottle of wine". Last use for cocaine was 09/20/2022. She has current withdrawal symptoms that consist of "shakes, tremors, agitation, skin crawling, anxiety, sweats, cold chills".  What Did You Use and How Much? Patient states that she binges on alcohol and drugs "all day, every day". Patient started drinking at the age of 42 years old. However, at the age age of 42 years old her alcohol use increased. Followed by using crack cocaine at the age of 42 years old. She has struggled with cocaine use for the past 24 years. Last use for alcohol was 09/21/2022, "1 bottle of wine". Last use for cocaine was 09/20/2022. She has current withdrawal symptoms that consist of "shakes, tremors, agitation, skin crawling, anxiety, sweats, cold chills".  Do you have any current medical co-morbidities that require immediate attention? No  Clinician description of patient physical appearance/behavior: Patient is disheveled, aggitated, irritable, cooperative.  What Do You Feel Would Help You the Most Today? Stress Management;Medication(s);Treatment for Depression or other mood problem;Alcohol or Drug Use Treatment;Housing Assistance;Social Support;Food Assistance;Financial Resources  If access to [facility] was not available, would you have sought care in the Emergency Department? Yes  Determination of Need Urgent (48 hours)  Options For Referral Medication Management;Chemical Dependency Intensive Outpatient Therapy (CDIOP);Facility-Based Crisis;Outpatient Therapy

## 2022-09-23 ENCOUNTER — Ambulatory Visit (HOSPITAL_COMMUNITY)
Admission: EM | Admit: 2022-09-23 | Discharge: 2022-09-23 | Disposition: A | Discharge status: Other Acute Inpatient Hospital | Payer: MEDICAID | Attending: Psychiatry | Admitting: Psychiatry

## 2022-09-23 ENCOUNTER — Other Ambulatory Visit: Payer: Self-pay

## 2022-09-23 ENCOUNTER — Emergency Department (HOSPITAL_COMMUNITY)
Admission: EM | Admit: 2022-09-23 | Discharge: 2022-09-24 | Disposition: A | Discharge status: Other Acute Inpatient Hospital | Payer: MEDICAID | Attending: Emergency Medicine | Admitting: Emergency Medicine

## 2022-09-23 ENCOUNTER — Encounter (HOSPITAL_COMMUNITY): Payer: Self-pay

## 2022-09-23 DIAGNOSIS — N939 Abnormal uterine and vaginal bleeding, unspecified: Secondary | ICD-10-CM | POA: Diagnosis not present

## 2022-09-23 DIAGNOSIS — E876 Hypokalemia: Secondary | ICD-10-CM | POA: Diagnosis not present

## 2022-09-23 DIAGNOSIS — F1410 Cocaine abuse, uncomplicated: Secondary | ICD-10-CM | POA: Insufficient documentation

## 2022-09-23 DIAGNOSIS — N898 Other specified noninflammatory disorders of vagina: Secondary | ICD-10-CM | POA: Diagnosis present

## 2022-09-23 DIAGNOSIS — F1010 Alcohol abuse, uncomplicated: Secondary | ICD-10-CM | POA: Insufficient documentation

## 2022-09-23 DIAGNOSIS — F1090 Alcohol use, unspecified, uncomplicated: Secondary | ICD-10-CM

## 2022-09-23 DIAGNOSIS — F1920 Other psychoactive substance dependence, uncomplicated: Secondary | ICD-10-CM | POA: Diagnosis not present

## 2022-09-23 DIAGNOSIS — E860 Dehydration: Secondary | ICD-10-CM

## 2022-09-23 DIAGNOSIS — F1990 Other psychoactive substance use, unspecified, uncomplicated: Secondary | ICD-10-CM

## 2022-09-23 LAB — BASIC METABOLIC PANEL
Anion gap: 8 (ref 5–15)
BUN: 14 mg/dL (ref 6–20)
CO2: 36 mmol/L — ABNORMAL HIGH (ref 22–32)
Calcium: 9.2 mg/dL (ref 8.9–10.3)
Chloride: 95 mmol/L — ABNORMAL LOW (ref 98–111)
Creatinine, Ser: 1.33 mg/dL — ABNORMAL HIGH (ref 0.44–1.00)
GFR, Estimated: 51 mL/min — ABNORMAL LOW (ref >60)
Glucose, Bld: 77 mg/dL (ref 70–99)
Potassium: 3.7 mmol/L (ref 3.5–5.1)
Sodium: 139 mmol/L (ref 135–145)

## 2022-09-23 LAB — COMPREHENSIVE METABOLIC PANEL
ALT: 27 U/L (ref 0–44)
AST: 40 U/L (ref 15–41)
Albumin: 4.4 g/dL (ref 3.5–5.0)
Alkaline Phosphatase: 71 U/L (ref 38–126)
Anion gap: 19 — ABNORMAL HIGH (ref 5–15)
BUN: 13 mg/dL (ref 6–20)
CO2: 29 mmol/L (ref 22–32)
Calcium: 10 mg/dL (ref 8.9–10.3)
Chloride: 88 mmol/L — ABNORMAL LOW (ref 98–111)
Creatinine, Ser: 1.06 mg/dL — ABNORMAL HIGH (ref 0.44–1.00)
GFR, Estimated: >60 mL/min (ref >60)
Glucose, Bld: 86 mg/dL (ref 70–99)
Potassium: 2.9 mmol/L — ABNORMAL LOW (ref 3.5–5.1)
Sodium: 136 mmol/L (ref 135–145)
Total Bilirubin: <0.1 mg/dL — ABNORMAL LOW (ref 0.3–1.2)
Total Protein: 8.1 g/dL (ref 6.5–8.1)

## 2022-09-23 LAB — POCT URINE DRUG SCREEN - MANUAL ENTRY (I-SCREEN)
POC Amphetamine UR: NOT DETECTED
POC Buprenorphine (BUP): NOT DETECTED
POC Cocaine UR: POSITIVE — AB
POC Marijuana UR: NOT DETECTED
POC Methadone UR: NOT DETECTED
POC Methamphetamine UR: NOT DETECTED
POC Morphine: NOT DETECTED
POC Oxazepam (BZO): NOT DETECTED
POC Oxycodone UR: NOT DETECTED
POC Secobarbital (BAR): NOT DETECTED

## 2022-09-23 LAB — CBC WITH DIFFERENTIAL/PLATELET
Abs Immature Granulocytes: 0.03 10*3/uL (ref 0.00–0.07)
Basophils Absolute: 0.1 10*3/uL (ref 0.0–0.1)
Basophils Relative: 1 %
Eosinophils Absolute: 0.3 10*3/uL (ref 0.0–0.5)
Eosinophils Relative: 4 %
HCT: 44.4 % (ref 36.0–46.0)
Hemoglobin: 13.9 g/dL (ref 12.0–15.0)
Immature Granulocytes: 0 %
Lymphocytes Relative: 25 %
Lymphs Abs: 2.1 10*3/uL (ref 0.7–4.0)
MCH: 25.7 pg — ABNORMAL LOW (ref 26.0–34.0)
MCHC: 31.3 g/dL (ref 30.0–36.0)
MCV: 82.1 fL (ref 80.0–100.0)
Monocytes Absolute: 0.9 10*3/uL (ref 0.1–1.0)
Monocytes Relative: 11 %
Neutro Abs: 4.9 10*3/uL (ref 1.7–7.7)
Neutrophils Relative %: 59 %
Platelets: 377 10*3/uL (ref 150–400)
RBC: 5.41 MIL/uL — ABNORMAL HIGH (ref 3.87–5.11)
RDW: 17.2 % — ABNORMAL HIGH (ref 11.5–15.5)
WBC: 8.4 10*3/uL (ref 4.0–10.5)
nRBC: 0 % (ref 0.0–0.2)

## 2022-09-23 LAB — WET PREP, GENITAL
Sperm: NONE SEEN
Trich, Wet Prep: NONE SEEN
WBC, Wet Prep HPF POC: >=10 — AB (ref <10)
Yeast Wet Prep HPF POC: NONE SEEN

## 2022-09-23 LAB — LIPASE, BLOOD: Lipase: 31 U/L (ref 11–51)

## 2022-09-23 LAB — ETHANOL: Alcohol, Ethyl (B): <10 mg/dL (ref <10)

## 2022-09-23 LAB — MAGNESIUM: Magnesium: 2.3 mg/dL (ref 1.7–2.4)

## 2022-09-23 MED ORDER — LORAZEPAM 2 MG/ML IJ SOLN: 1.0000 mg | INTRAMUSCULAR | Status: DC | PRN

## 2022-09-23 MED ORDER — POTASSIUM CHLORIDE 10 MEQ/100ML IV SOLN
10.0000 meq | INTRAVENOUS | Status: DC
  Administered 2022-09-23: 10 meq via INTRAVENOUS
  Filled 2022-09-23: qty 100

## 2022-09-23 MED ORDER — ADULT MULTIVITAMIN W/MINERALS CH
1.0000 | ORAL_TABLET | Freq: Every day | ORAL | Status: DC
  Administered 2022-09-23: 1 via ORAL
  Filled 2022-09-23: qty 1

## 2022-09-23 MED ORDER — THIAMINE MONONITRATE 100 MG PO TABS
100.0000 mg | ORAL_TABLET | Freq: Every day | ORAL | Status: DC
  Administered 2022-09-23: 100 mg via ORAL
  Filled 2022-09-23: qty 1

## 2022-09-23 MED ORDER — POTASSIUM CHLORIDE 10 MEQ/100ML IV SOLN
10.0000 meq | Freq: Once | INTRAVENOUS | Status: AC
  Administered 2022-09-23: 10 meq via INTRAVENOUS
  Filled 2022-09-23: qty 100

## 2022-09-23 MED ORDER — POTASSIUM CHLORIDE CRYS ER 20 MEQ PO TBCR: 40.0000 meq | EXTENDED_RELEASE_TABLET | Freq: Once | ORAL | Status: DC

## 2022-09-23 MED ORDER — ONDANSETRON 4 MG PO TBDP
4.0000 mg | ORAL_TABLET | Freq: Once | ORAL | Status: AC
  Administered 2022-09-23: 4 mg via ORAL
  Filled 2022-09-23: qty 1

## 2022-09-23 MED ORDER — THIAMINE HCL 100 MG/ML IJ SOLN: 100.0000 mg | Freq: Every day | INTRAMUSCULAR | Status: DC

## 2022-09-23 MED ORDER — LACTATED RINGERS IV BOLUS
1000.0000 mL | Freq: Once | INTRAVENOUS | Status: AC
  Administered 2022-09-23: 1000 mL via INTRAVENOUS

## 2022-09-23 MED ORDER — LORAZEPAM 2 MG/ML IJ SOLN
2.0000 mg | Freq: Once | INTRAMUSCULAR | Status: AC
  Administered 2022-09-23: 2 mg via INTRAVENOUS
  Filled 2022-09-23: qty 1

## 2022-09-23 MED ORDER — KETOROLAC TROMETHAMINE 30 MG/ML IJ SOLN
30.0000 mg | Freq: Once | INTRAMUSCULAR | Status: AC
  Administered 2022-09-23: 30 mg via INTRAVENOUS
  Filled 2022-09-23: qty 1

## 2022-09-23 MED ORDER — LORAZEPAM 1 MG PO TABS: 1.0000 mg | ORAL_TABLET | ORAL | Status: DC | PRN

## 2022-09-23 MED ORDER — POTASSIUM CHLORIDE CRYS ER 20 MEQ PO TBCR
80.0000 meq | EXTENDED_RELEASE_TABLET | Freq: Once | ORAL | Status: AC
  Administered 2022-09-23: 80 meq via ORAL
  Filled 2022-09-23: qty 4

## 2022-09-23 MED ORDER — FOLIC ACID 1 MG PO TABS
1.0000 mg | ORAL_TABLET | Freq: Every day | ORAL | Status: DC
  Administered 2022-09-23: 1 mg via ORAL
  Filled 2022-09-23: qty 1

## 2022-09-23 NOTE — ED Triage Notes (Signed)
PT sent over from [facility] via [facility] for medical clearance prior to detox.  They were not able to get lab work and she left a recent admit for hypokalemia so [facility] is concerned about her potassium being low today.    Pt is A&O calm & cooperative at this point. She complains of feeling like she is withdrawing.  Paperwork at bedside.

## 2022-09-23 NOTE — ED Notes (Signed)
RN Sharia Reeve will collect labs when IV is placed.

## 2022-09-23 NOTE — ED Notes (Signed)
Patient called out again, This RN went to room to answer call bell & draw ordered lab. Patient demanding for zofran, explained to patient I didn't have an order and that I would request one from the MD. Patient states "Well I guess all nurses do it differently because other nurses just bring them to me. Zofran helps me calm down." Tried to redirect patient on calming measures. Patient stated "I am going to go manic and hit myself if you don't get it for me." Again explained to patient unable to give medication without order and have requested it from the provider.

## 2022-09-23 NOTE — ED Provider Notes (Signed)
[place] EMERGENCY DEPARTMENT AT [facility] Provider Note   CSN: [id number] Arrival date & time: 09/23/22  1056     History  No chief complaint on file.   Judy [name] is a 42 y.o. female.  Patient is a 42 year old female with a history of polysubstance abuse including alcohol use, cocaine use who is coming from behavioral health urgent care today due to medical clearance.  Patient recently admitted at the end of June after having admission for nausea vomiting and electrolyte abnormalities.  Patient did leave AMA but reports she went to be held because she wants to go into a program and get clean.  She last had alcohol and drugs at 3 or 4 AM this morning.  She is denying any abdominal pain nausea or vomiting.  She reports she is very hungry and has not eaten in a few days.  Complaining of some vaginal discharge and is on her menses now for the first time in 8 months.  She also has not taking her mental health medications.  The history is provided by the patient and medical records.       Home Medications Prior to Admission medications   Not on File      Allergies    Metoclopramide, Cephalosporins, Penicillins, and Sulfa antibiotics    Review of Systems   Review of Systems  Physical Exam Updated Vital Signs BP 104/67 (BP Location: Left Arm)   Pulse 79   Temp 98.8 F (37.1 C) (Oral)   Resp 17   SpO2 97%  Physical Exam Vitals and nursing note reviewed. Exam conducted with a chaperone present.  Constitutional:      General: She is not in acute distress.    Appearance: She is well-developed.  HENT:     Head: Normocephalic and atraumatic.     Comments: Edentulous Eyes:     Pupils: Pupils are equal, round, and reactive to light.  Cardiovascular:     Rate and Rhythm: Normal rate and regular rhythm.     Heart sounds: Normal heart sounds. No murmur heard.    No friction rub.  Pulmonary:     Effort: Pulmonary effort is normal.     Breath sounds: Normal  breath sounds. No wheezing or rales.  Abdominal:     General: Bowel sounds are normal. There is no distension.     Palpations: Abdomen is soft.     Tenderness: There is no abdominal tenderness. There is no guarding or rebound.  Genitourinary:    Vagina: Bleeding present.     Cervix: Cervical bleeding present.     Uterus: Normal.      Adnexa: Right adnexa normal and left adnexa normal.  Musculoskeletal:        General: No tenderness. Normal range of motion.     Comments: No edema  Skin:    General: Skin is warm and dry.     Findings: No rash.  Neurological:     Mental Status: She is alert and oriented to person, place, and time.     Cranial Nerves: No cranial nerve deficit.  Psychiatric:        Behavior: Behavior normal.     ED Results / Procedures / Treatments   Labs (all labs ordered are listed, but only abnormal results are displayed) Labs Reviewed  CBC WITH DIFFERENTIAL/PLATELET - Abnormal; Notable for the following components:      Result Value   RBC 5.41 (*)    MCH 25.7 (*)  RDW 17.2 (*)    All other components within normal limits  COMPREHENSIVE METABOLIC PANEL - Abnormal; Notable for the following components:   Potassium 2.9 (*)    Chloride 88 (*)    Creatinine, Ser 1.06 (*)    Total Bilirubin <0.1 (*)    Anion gap 19 (*)    All other components within normal limits  WET PREP, GENITAL  MAGNESIUM  ETHANOL  RPR  LIPASE, BLOOD  BASIC METABOLIC PANEL  GC/CHLAMYDIA PROBE AMP ([place]) NOT AT [facility]    EKG None  Radiology No results found.  Procedures Procedures    Medications Ordered in ED Medications  lactated ringers bolus 1,000 mL (has no administration in time range)  potassium chloride 10 mEq in 100 mL IVPB (has no administration in time range)  LORazepam (ATIVAN) tablet 1-4 mg (has no administration in time range)    Or  LORazepam (ATIVAN) injection 1-4 mg (has no administration in time range)  thiamine (VITAMIN B1) tablet 100 mg (has no  administration in time range)    Or  thiamine (VITAMIN B1) injection 100 mg (has no administration in time range)  folic acid (FOLVITE) tablet 1 mg (has no administration in time range)  multivitamin with minerals tablet 1 tablet (has no administration in time range)  potassium chloride SA (KLOR-CON M) CR tablet 40 mEq (has no administration in time range)  potassium chloride SA (KLOR-CON M) CR tablet 80 mEq (has no administration in time range)  lactated ringers bolus 1,000 mL (1,000 mLs Intravenous New Bag/Given 09/23/22 1314)  ondansetron (ZOFRAN-ODT) disintegrating tablet 4 mg (4 mg Oral Given 09/23/22 1220)  LORazepam (ATIVAN) injection 2 mg (2 mg Intravenous Given 09/23/22 1314)    ED Course/ Medical Decision Making/ A&P                             Medical Decision Making Amount and/or Complexity of Data Reviewed External Data Reviewed: notes. Labs: ordered. Decision-making details documented in ED Course.  Risk OTC drugs. Prescription drug management. Parenteral controlled substances.   Pt with multiple medical problems and comorbidities and presenting today with a complaint that caries a high risk for morbidity and mortality.  Here today for medical clearance for behavioral health urgent care so that she can get substance abuse help.  Patient last had alcohol and cocaine about 3 or 4 this morning.  Patient does not appear intoxicated or to be having any withdrawal symptoms at this time.  However in the past patient has been hypokalemic, hypomagnesemic and dehydrated due to poor nutrition from her chronic alcohol use.  Here patient is hemodynamically stable.  Labs were drawn for medical clearance.  I independently interpreted patient's labs and CBC without acute findings, CMP shows hypokalemia with a creatinine of 2.9 and mild AKI with creatinine of 1.06 from her baseline of 0.8.  Anion gap of 19.  Patient's magnesium is normal.  Potassium replaced with IV and oral.  Alcohol level was  negative.  Patient did recently have HIV testing done last week which was negative but will send RPR as she does report unprotected sex and intermittent discharge.  On pelvic exam patient is currently on menses but has no acute findings otherwise.  After fluids and potassium we will recheck labs but feel that patient will be medically cleared to go back to behavioral health.         Final Clinical Impression(s) / ED Diagnoses Final  diagnoses:  Hypokalemia  Dehydration  Polysubstance use disorder    Rx / DC Orders ED Discharge Orders     None         Gwyneth Sprout, MD 09/23/22 1606

## 2022-09-23 NOTE — Progress Notes (Signed)
   09/23/22 0545  [facility] Triage Screening (Walk-ins at [facility] only)  How Did You Hear About [place]? Self  What Is the Reason for Your Visit/Call Today? Pt presents to [facility] voluntarily, unaccompanied requesting substance abuse treatment and suicidal ideations, with no plan or intent. Pt reports using alcohol and cocaine within the last 24 hours (unknown amount).She has current withdrawal symptoms that consist of "shakes, tremors, agitation, skin crawling, anxiety, sweats, cold chills and nausea". Pt states that she is struggling with alcohol and drug addiction; also depression. Pt was seen yesterday (09/23/22), but was discharged with instructions to go to [facility]. Pt is experiencing homelessness and lack of social supports at this time. Pt denies HI,AVH.  How Long Has This Been Causing You Problems? > than 6 months  Have You Recently Had Any Thoughts About Hurting Yourself? Yes  How long ago did you have thoughts about hurting yourself? currently  Are You Planning to Commit Suicide/Harm Yourself At This time? No  Have you Recently Had Thoughts About Hurting Someone [name]? No  Are You Planning To Harm Someone At This Time? No  Are you currently experiencing any auditory, visual or other hallucinations? No  Have You Used Any Alcohol or Drugs in the Past 24 Hours? Yes  How long ago did you use Drugs or Alcohol? Cocaine and alcohol (anything I can get my hands on) about 30-40 minutes ago  What Did You Use and How Much? alcohol and cocaine (unknown amount)  Do you have any current medical co-morbidities that require immediate attention? No  Clinician description of patient physical appearance/behavior: pt is cooperative, calm  What Do You Feel Would Help You the Most Today? Alcohol or Drug Use Treatment;Treatment for Depression or other mood problem;Financial Resources;Food Assistance;Housing Assistance;Social Support;Medication(s)  If access to [facility] Urgent Care was not available, would you have sought care in the  Emergency Department? Yes  Determination of Need Urgent (48 hours)  Options For Referral Other: Comment;Outpatient Therapy;Chemical Dependency Intensive Outpatient Therapy (CDIOP);Facility-Based Crisis;Medication Management

## 2022-09-23 NOTE — ED Notes (Signed)
Inquired of RN Elexes about the need for pt to be on leads, RN denied need, Pt is not on leads

## 2022-09-23 NOTE — ED Provider Notes (Signed)
Behavioral Health Urgent Care Medical Screening Exam  Patient Name: Judy [name] MRN: [id number] Date of Evaluation: 09/23/22 Chief Complaint:   Diagnosis:  Final diagnoses:  Alcohol use disorder  Cocaine use disorder (HCC)  Hypokalemia    History of Present illness: Judy [name] is a 42 y.o. female with a past psychiatric history of reported bipolar disorder, alcohol use disorder, and cocaine use disorder with multiple hospitalizations (recently hospitalized for hypokalemia and left AMA on 6/24) who presents to the [facility] for substance use treatment.  Of note, patient was here yesterday and stated that she was wanting rehabilitation services at [facility].  At that time she said she stopped using cocaine and drinking alcohol on Thursday.   Yesterday, patient refused to have labs drawn or UDS.    When she arrived today, she stated that her last use was yesterday. At this time we ordered a CBC, CMP, and UDS.  However we were unable to obtain blood work so patient will be transported to the ED for medical clearance as there is concern of hypokalemia due to her history of previous hospitalization.  Patient states she is now ready to do detox.  However with the risk of medical deterioration, we will need medical clearance prior to admission to the [facility].  Flowsheet Row ED from 09/23/2022 in [facility] ED from 09/22/2022 in [facility] ED to Hosp-Admission (Discharged) from 09/14/2022 in [place] [facility]  C-SSRS RISK CATEGORY Low Risk No Risk No Risk       Psychiatric Specialty Exam  Presentation  General Appearance:Disheveled  Eye Contact:Poor  Speech:Slurred  Speech Volume:Normal  Handedness:Right   Mood and Affect  Mood: Depressed; Hopeless  Affect: Congruent   Thought Process  Thought Processes: Coherent  Descriptions of Associations:Intact  Orientation:Full (Time, Place and Person)  Thought  Content:Logical    Hallucinations:None  Ideas of Reference:None  Suicidal Thoughts:No  Homicidal Thoughts:No   Sensorium  Memory: Immediate Fair; Recent Fair; Remote Fair  Judgment: Poor  Insight: Poor   Executive Functions  Concentration: Fair  Attention Span: Fair  Recall: Poor  Fund of Knowledge: Poor  Language: Fair   Psychomotor Activity  Psychomotor Activity: Normal   Assets  Assets: Resilience   Sleep  Sleep: Fair  Number of hours: No data recorded  Physical Exam: Physical Exam Vitals reviewed.  Constitutional:      Appearance: She is ill-appearing.  Cardiovascular:     Rate and Rhythm: Normal rate.  Pulmonary:     Effort: Pulmonary effort is normal.  Neurological:     Mental Status: She is alert.    Review of Systems  Constitutional:  Negative for chills and fever.  Respiratory:  Negative for cough.   Psychiatric/Behavioral:  Positive for depression and substance abuse.    Blood pressure (!) 122/92, pulse 79, temperature (!) 97.5 F (36.4 C), temperature source Oral, resp. rate 18, SpO2 99 %, unknown if currently breastfeeding. There is no height or weight on file to calculate BMI.  Musculoskeletal: Strength & Muscle Tone: within normal limits Gait & Station: normal Patient leans: N/A   [facility] MSE Discharge Disposition for Follow up and Recommendations: Based on my evaluation the patient appears to have an emergency medical condition for which I recommend the patient be transferred to the emergency department for further evaluation.  This is due to history of leaving AMA from the hospital on 6/24 due to hypokalemia of 2.  Patient is at  risk for deterioration due to chronic malnutrition, substance use, and history of prior hospitalizations of hypokalemia.   Lance Muss, MD 09/23/2022, 9:32 AM

## 2022-09-23 NOTE — ED Notes (Signed)
Pt Refused labs, RN Sharia Reeve will collect

## 2022-09-23 NOTE — ED Notes (Signed)
Multiple attempts made by 2 RN's to start IV.  Awaiting [place] IV

## 2022-09-23 NOTE — Discharge Instructions (Addendum)
Follow-up recommendations:  Activity:  Normal, as tolerated Diet:  Per PCP recommendation  Patient is instructed prior to discharge to: Take all medications as prescribed by her mental healthcare provider. Report any adverse effects and/or reactions from the medicines to her outpatient provider promptly. Patient has been instructed & cautioned: To not engage in alcohol and or illegal drug use while on prescription medicines.  In the event of worsening symptoms, patient is instructed to call the crisis hotline at 988, 911 and or go to the nearest ED for appropriate evaluation and treatment of symptoms. To follow-up with her primary care provider for your other medical issues, concerns and or health care needs.  

## 2022-09-23 NOTE — ED Notes (Signed)
Patient calling out multiple times requesting food to be reheated and multiple beverages. Patient has received 4 ER meal bags, 2 dietary meal tray, 3 chocolate milks, 4 cokes. Asked patient if she needed anything else prior to leaving room, patient requested a warm blanket. Provided patient with warm blanket. Explained to patient I would round on her again in an hour. Patient continues to be verbally abusive to staff members by yelling and cursing at them. Attempted to redirect patient of appropriate behavior, patient then stated "You know I can get you fired for not giving me what I want. My potassium is still low and ya'll have to fix it." I educated patient that we have treated her for her low potassium level. Patient then stated " are you doing this because I am black?" Explained to patient there was no racial bias, that her behavior of yelling & cursing at staff was unacceptable behavior that would not be tolerated.

## 2022-09-23 NOTE — ED Notes (Signed)
Pt transferred to [facility] for medical clearance to be admitted to observation unit. Calm, cooperative throughout transfer process.  Pt verbally contract for safety. Will monitor for safety.

## 2022-09-23 NOTE — ED Notes (Signed)
PTAR Called, no eta

## 2022-09-23 NOTE — Progress Notes (Signed)
CSW spoke with ED provider who stated patient doesn't need substance abuse counseling at this time.

## 2022-09-23 NOTE — ED Notes (Signed)
Pt filled a blue emesis bag with emesis. EDP notified

## 2022-09-23 NOTE — ED Provider Notes (Addendum)
Patient was sent from the behavioral health urgent care to be medically cleared.  Patient was initially hypokalemic.  Patient was treated with IV and oral potassium.  Her potassium has improved.  Patient is now stable for discharge back to behavioral health urgent care for treatment of her substance abuse disorder    Patient reassessed 11:26 PM.  She is calm and cooperative.  She is eating food and agrees with transfer back to behavioral health urgent care for treatment.   Linwood Dibbles, MD 09/23/22 [phone]

## 2022-09-23 NOTE — ED Notes (Signed)
Patient ambulating out into hallway with no pants on, started yelling at lab tech. Redirected patient back to room. Patient continues to yell and cuss at staff.

## 2022-09-23 NOTE — ED Notes (Signed)
Shift report received from Josh RN  

## 2022-09-24 ENCOUNTER — Other Ambulatory Visit (HOSPITAL_COMMUNITY)
Admission: EM | Admit: 2022-09-24 | Discharge: 2022-09-25 | Discharge status: Against Medical Advice | Payer: MEDICAID | Attending: Psychiatry | Admitting: Psychiatry

## 2022-09-24 ENCOUNTER — Other Ambulatory Visit: Payer: Self-pay

## 2022-09-24 ENCOUNTER — Ambulatory Visit (HOSPITAL_COMMUNITY)
Admission: EM | Admit: 2022-09-24 | Discharge: 2022-09-24 | Payer: MEDICAID | Attending: Psychiatry | Admitting: Psychiatry

## 2022-09-24 ENCOUNTER — Encounter (HOSPITAL_COMMUNITY): Payer: Self-pay | Admitting: Psychiatry

## 2022-09-24 DIAGNOSIS — F1020 Alcohol dependence, uncomplicated: Secondary | ICD-10-CM | POA: Insufficient documentation

## 2022-09-24 DIAGNOSIS — F1910 Other psychoactive substance abuse, uncomplicated: Secondary | ICD-10-CM | POA: Insufficient documentation

## 2022-09-24 DIAGNOSIS — F1420 Cocaine dependence, uncomplicated: Secondary | ICD-10-CM | POA: Insufficient documentation

## 2022-09-24 DIAGNOSIS — E876 Hypokalemia: Secondary | ICD-10-CM | POA: Diagnosis not present

## 2022-09-24 DIAGNOSIS — F1410 Cocaine abuse, uncomplicated: Secondary | ICD-10-CM | POA: Insufficient documentation

## 2022-09-24 DIAGNOSIS — F1994 Other psychoactive substance use, unspecified with psychoactive substance-induced mood disorder: Secondary | ICD-10-CM | POA: Insufficient documentation

## 2022-09-24 DIAGNOSIS — F1914 Other psychoactive substance abuse with psychoactive substance-induced mood disorder: Secondary | ICD-10-CM | POA: Diagnosis not present

## 2022-09-24 DIAGNOSIS — R45851 Suicidal ideations: Secondary | ICD-10-CM | POA: Insufficient documentation

## 2022-09-24 DIAGNOSIS — Z5902 Unsheltered homelessness: Secondary | ICD-10-CM | POA: Insufficient documentation

## 2022-09-24 DIAGNOSIS — F3175 Bipolar disorder, in partial remission, most recent episode depressed: Secondary | ICD-10-CM | POA: Insufficient documentation

## 2022-09-24 DIAGNOSIS — F1010 Alcohol abuse, uncomplicated: Secondary | ICD-10-CM | POA: Diagnosis present

## 2022-09-24 DIAGNOSIS — F17210 Nicotine dependence, cigarettes, uncomplicated: Secondary | ICD-10-CM | POA: Insufficient documentation

## 2022-09-24 LAB — RPR: RPR Ser Ql: NONREACTIVE

## 2022-09-24 MED ORDER — ACETAMINOPHEN 325 MG PO TABS: 650.0000 mg | ORAL_TABLET | Freq: Four times a day (QID) | ORAL | Status: DC | PRN

## 2022-09-24 MED ORDER — CHLORDIAZEPOXIDE HCL 25 MG PO CAPS: 25.0000 mg | ORAL_CAPSULE | Freq: Every day | ORAL | Status: DC

## 2022-09-24 MED ORDER — LORAZEPAM 1 MG PO TABS
1.0000 mg | ORAL_TABLET | Freq: Three times a day (TID) | ORAL | Status: DC
  Administered 2022-09-25: 1 mg via ORAL
  Filled 2022-09-24: qty 1

## 2022-09-24 MED ORDER — THIAMINE MONONITRATE 100 MG PO TABS: 100.0000 mg | ORAL_TABLET | Freq: Every day | ORAL | Status: DC

## 2022-09-24 MED ORDER — ENSURE ENLIVE PO LIQD: 237.0000 mL | Freq: Two times a day (BID) | ORAL | Status: DC

## 2022-09-24 MED ORDER — POTASSIUM CHLORIDE CRYS ER 20 MEQ PO TBCR
40.0000 meq | EXTENDED_RELEASE_TABLET | Freq: Once | ORAL | Status: AC
  Administered 2022-09-24: 40 meq via ORAL
  Filled 2022-09-24: qty 2

## 2022-09-24 MED ORDER — LORAZEPAM 1 MG PO TABS: 1.0000 mg | ORAL_TABLET | Freq: Four times a day (QID) | ORAL | Status: DC | PRN

## 2022-09-24 MED ORDER — HYDROXYZINE HCL 25 MG PO TABS
25.0000 mg | ORAL_TABLET | Freq: Three times a day (TID) | ORAL | Status: DC | PRN
  Filled 2022-09-24: qty 1

## 2022-09-24 MED ORDER — ONDANSETRON 4 MG PO TBDP
4.0000 mg | ORAL_TABLET | Freq: Four times a day (QID) | ORAL | Status: DC | PRN
  Administered 2022-09-24: 4 mg via ORAL
  Filled 2022-09-24: qty 1

## 2022-09-24 MED ORDER — THIAMINE HCL 100 MG/ML IJ SOLN
100.0000 mg | Freq: Once | INTRAMUSCULAR | Status: DC
Start: 2022-09-24 — End: 2022-09-24

## 2022-09-24 MED ORDER — GABAPENTIN 100 MG PO CAPS
100.0000 mg | ORAL_CAPSULE | Freq: Three times a day (TID) | ORAL | Status: DC
  Administered 2022-09-24 – 2022-09-25 (×3): 100 mg via ORAL
  Filled 2022-09-24 (×3): qty 1

## 2022-09-24 MED ORDER — LORAZEPAM 1 MG PO TABS: 1.0000 mg | ORAL_TABLET | Freq: Every day | ORAL | Status: DC

## 2022-09-24 MED ORDER — ONDANSETRON 4 MG PO TBDP: 4.0000 mg | ORAL_TABLET | Freq: Four times a day (QID) | ORAL | Status: DC | PRN

## 2022-09-24 MED ORDER — LORAZEPAM 1 MG PO TABS: 1.0000 mg | ORAL_TABLET | ORAL | Status: DC | PRN

## 2022-09-24 MED ORDER — CHLORDIAZEPOXIDE HCL 25 MG PO CAPS
25.0000 mg | ORAL_CAPSULE | ORAL | Status: DC
Start: 2022-09-26 — End: 2022-09-24

## 2022-09-24 MED ORDER — ADULT MULTIVITAMIN W/MINERALS CH: 1.0000 | ORAL_TABLET | Freq: Every day | ORAL | Status: DC

## 2022-09-24 MED ORDER — METRONIDAZOLE 0.75 % VA GEL
1.0000 | Freq: Every day | VAGINAL | Status: DC
  Administered 2022-09-24: 1 via VAGINAL
  Filled 2022-09-24 (×2): qty 70

## 2022-09-24 MED ORDER — LOPERAMIDE HCL 2 MG PO CAPS: 2.0000 mg | ORAL_CAPSULE | ORAL | Status: DC | PRN

## 2022-09-24 MED ORDER — CHLORDIAZEPOXIDE HCL 25 MG PO CAPS: 25.0000 mg | ORAL_CAPSULE | Freq: Three times a day (TID) | ORAL | Status: DC

## 2022-09-24 MED ORDER — THIAMINE MONONITRATE 100 MG PO TABS
100.0000 mg | ORAL_TABLET | Freq: Every day | ORAL | Status: DC
  Administered 2022-09-25: 100 mg via ORAL
  Filled 2022-09-24 (×3): qty 1

## 2022-09-24 MED ORDER — ALUM & MAG HYDROXIDE-SIMETH 200-200-20 MG/5ML PO SUSP: 30.0000 mL | ORAL | Status: DC | PRN

## 2022-09-24 MED ORDER — THIAMINE HCL 100 MG/ML IJ SOLN
100.0000 mg | Freq: Once | INTRAMUSCULAR | Status: DC
  Filled 2022-09-24: qty 2

## 2022-09-24 MED ORDER — TRAZODONE HCL 50 MG PO TABS
50.0000 mg | ORAL_TABLET | Freq: Every evening | ORAL | Status: DC | PRN
  Administered 2022-09-24: 50 mg via ORAL
  Filled 2022-09-24: qty 1

## 2022-09-24 MED ORDER — LORAZEPAM 1 MG PO TABS: 1.0000 mg | ORAL_TABLET | Freq: Two times a day (BID) | ORAL | Status: DC

## 2022-09-24 MED ORDER — CHLORDIAZEPOXIDE HCL 25 MG PO CAPS: 25.0000 mg | ORAL_CAPSULE | Freq: Four times a day (QID) | ORAL | Status: DC | PRN

## 2022-09-24 MED ORDER — LORAZEPAM 1 MG PO TABS
1.0000 mg | ORAL_TABLET | Freq: Four times a day (QID) | ORAL | Status: AC
  Administered 2022-09-24 (×2): 1 mg via ORAL
  Filled 2022-09-24 (×2): qty 1

## 2022-09-24 MED ORDER — MAGNESIUM HYDROXIDE 400 MG/5ML PO SUSP: 30.0000 mL | Freq: Every day | ORAL | Status: DC | PRN

## 2022-09-24 MED ORDER — ADULT MULTIVITAMIN W/MINERALS CH
1.0000 | ORAL_TABLET | Freq: Every day | ORAL | Status: DC
  Administered 2022-09-25: 1 via ORAL
  Filled 2022-09-24 (×2): qty 1

## 2022-09-24 MED ORDER — LOPERAMIDE HCL 2 MG PO CAPS
2.0000 mg | ORAL_CAPSULE | ORAL | Status: DC | PRN
Start: 2022-09-24 — End: 2022-09-24

## 2022-09-24 MED ORDER — LORAZEPAM 1 MG PO TABS: 2.0000 mg | ORAL_TABLET | Freq: Three times a day (TID) | ORAL | Status: DC | PRN

## 2022-09-24 MED ORDER — LORAZEPAM 2 MG/ML IJ SOLN: 2.0000 mg | Freq: Once | INTRAMUSCULAR | Status: DC

## 2022-09-24 MED ORDER — HYDROXYZINE HCL 25 MG PO TABS: 25.0000 mg | ORAL_TABLET | Freq: Four times a day (QID) | ORAL | Status: DC | PRN

## 2022-09-24 MED ORDER — CHLORDIAZEPOXIDE HCL 25 MG PO CAPS: 25.0000 mg | ORAL_CAPSULE | ORAL | Status: DC

## 2022-09-24 MED ORDER — LORAZEPAM 2 MG/ML IJ SOLN: 2.0000 mg | Freq: Three times a day (TID) | INTRAMUSCULAR | Status: DC | PRN

## 2022-09-24 MED ORDER — CHLORDIAZEPOXIDE HCL 25 MG PO CAPS
25.0000 mg | ORAL_CAPSULE | Freq: Four times a day (QID) | ORAL | Status: DC
  Administered 2022-09-24: 25 mg via ORAL
  Filled 2022-09-24: qty 1

## 2022-09-24 MED ORDER — ZIPRASIDONE MESYLATE 20 MG IM SOLR: 20.0000 mg | Freq: Two times a day (BID) | INTRAMUSCULAR | Status: DC | PRN

## 2022-09-24 MED ORDER — ENSURE ENLIVE PO LIQD
237.0000 mL | Freq: Two times a day (BID) | ORAL | Status: DC
  Administered 2022-09-24: 237 mL via ORAL

## 2022-09-24 MED ORDER — CHLORDIAZEPOXIDE HCL 25 MG PO CAPS: 25.0000 mg | ORAL_CAPSULE | Freq: Four times a day (QID) | ORAL | Status: DC

## 2022-09-24 MED ORDER — LORAZEPAM 1 MG PO TABS: 2.0000 mg | ORAL_TABLET | Freq: Once | ORAL | Status: DC

## 2022-09-24 NOTE — ED Notes (Signed)
Patient was provided with dinner 

## 2022-09-24 NOTE — Progress Notes (Signed)
   09/24/22 0104  [facility] Triage Screening (Walk-ins at [facility] only)  How Did You Hear About [place]? Self  What Is the Reason for Your Visit/Call Today? Judy [name] is a 42 year old female presenting as a voluntary walk-in to [facility] Urgent Care due to [facility] with no plan and substance abuse. Patient is currently homeless. Patient is requesting detox from alcohol and crack/cocaine. Patient denied HI and psychosis. Patient reports using $500-600.00 daily of alcohol and $300-400.00 of crack cocaine. Patient reports worsening depressive symptoms. Patient unable to contract for safety.  How Long Has This Been Causing You Problems? > than 6 months  Have You Recently Had Any Thoughts About Hurting Yourself? Yes  How long ago did you have thoughts about hurting yourself? prior to arrival  Are You Planning to Commit Suicide/Harm Yourself At This time? No  Have you Recently Had Thoughts About Hurting Someone [name]? No  Are You Planning To Harm Someone At This Time? No  Are you currently experiencing any auditory, visual or other hallucinations? No  Have You Used Any Alcohol or Drugs in the Past 24 Hours? Yes  How long ago did you use Drugs or Alcohol? alcohol and crack  What Did You Use and How Much? "a lot"  Do you have any current medical co-morbidities that require immediate attention? No  Clinician description of patient physical appearance/behavior: disheveled / cooperative  What Do You Feel Would Help You the Most Today? Alcohol or Drug Use Treatment;Treatment for Depression or other mood problem  If access to [facility]gent Care was not available, would you have sought care in the Emergency Department? No  Determination of Need Urgent (48 hours)  Options For Referral Inpatient Hospitalization;Medication Management;Outpatient Therapy;BH Urgent Care    Flowsheet Row ED from 09/24/2022 in [facility] Most recent reading at 09/24/2022  1:37 AM ED from 09/23/2022 in  [facility] Emergency Department at [facility] Most recent reading at 09/23/2022  9:12 PM ED from 09/23/2022 in [facility] Most recent reading at 09/23/2022  6:05 AM  C-SSRS RISK CATEGORY Low Risk No Risk Low Risk

## 2022-09-24 NOTE — Progress Notes (Signed)
Pt is asleep. Respiration are even and unlabored. No distress noted. Staff will monitor for pt's safety.
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
## 2022-09-24 NOTE — Progress Notes (Signed)
Pt's CIWA was 7 

## 2022-09-24 NOTE — ED Provider Notes (Addendum)
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 -Zofran 4 mg every 6 hours as needed for nausea or vomiting -Imodium 2 to 4 mg as needed for diarrhea or loose stools  -Maalox/Mylanta 30 mL every 4 hours as needed for indigestion -Milk of Mag 30 mL as needed for constipation  -agitation protocol: PRN ativan 2mg  IM/PO for alcohol withdrawal  #BV Wet prep shows clue cells. Has history of BV with treatment.  -topical metronidazole for 5 days   #AKI  Cr. 1.33, baseline appears ~1.  -continue to encourage hydration -repeat BMP tomorrow AM   Dispo:  pending   Recommendations  Based on my evaluation the patient does not appear to have an emergency medical condition.  Karie Fetch, MD, PGY-2 09/24/22  12:50 PM

## 2022-09-24 NOTE — Progress Notes (Addendum)
Pt's CIWA was 11. Pt is exhibiting moderate withdrawal symptoms. Medicated per order. Pt refused Thiamine 100mg  IM and Multivitamin. Will continue to monitor.

## 2022-09-24 NOTE — Progress Notes (Signed)
Pt was transferred from [facility] and admitted to [facility] due to substance abuse and for detox. Pt is alert and oriented X4. Pt is irritable and agitated on arrival to the unit. Pt is ambulatory and is oriented to staff/unit. Pt was cooperative with admission process and skin assessment. Pt complained of generalized body ache due to withdrawal. Pt denies current SI/HI/AVH, plan or intent. 15 minutes safety checks initiated per order. Staff will monitor for pt's safety.

## 2022-09-24 NOTE — Progress Notes (Addendum)
Pt signed the 72 hours request for discharge on 09/24/2022 @1702 . Form is placed in pt's folder.

## 2022-09-24 NOTE — ED Notes (Signed)
Patient is sleeping. Respirations equal and unlabored, skin warm and dry. No change in assessment or acuity. Routine safety checks conducted according to facility protocol. Will continue to monitor for safety.   

## 2022-09-24 NOTE — ED Notes (Signed)
Patient discharge via ambulatory with a steady gait to [facility]. Respirations equal and unlabored, skin warm and dry. No acute distress noted. Report given to Ryta, Charity fundraiser.

## 2022-09-24 NOTE — ED Notes (Signed)
Pt picked up by police for safe transport to [facility]. Pt in no distress upon departure.

## 2022-09-24 NOTE — ED Notes (Signed)
Patient was provided with lunch 

## 2022-09-24 NOTE — ED Notes (Signed)
Pt presents requesting help with polysubstance abuse.  Pt admits to abusing alcohol and cocaine.  Pt sleepy during assessment, constantly requesting food.  Comfort measures given, Denies SI, HI or AVH.  Monitoring for safety.

## 2022-09-24 NOTE — ED Notes (Signed)
Pt is in his room resting in bed. Pt denies SI/HI/AVH. No acute distress noted. Will continue to monitor for safety. 

## 2022-09-24 NOTE — Progress Notes (Signed)
Pt asked for Ensure and became agitated when informed that it is scheduled. Pt then refused to have her VS checked. Staff will monitor for pt's safety.

## 2022-09-24 NOTE — Group Note (Signed)
Group Topic: Understanding Self  Group Date: 09/24/2022 Start Time: 1230 End Time: 1330 Facilitators: Lenny Pastel  Department: [facility]  Number of Participants: 5  Group Focus: clarity of thought, communication, coping skills, daily focus, feeling awareness/expression, personal responsibility, problem solving, self-awareness, self-esteem, and substance abuse education Treatment Modality:  Cognitive Behavioral Therapy Interventions utilized were exploration, group exercise, mental fitness, problem solving, story telling, and support Purpose: enhance coping skills, explore maladaptive thinking, express feelings, express irrational fears, improve communication skills, increase insight, regain self-worth, reinforce self-care, relapse prevention strategies, and trigger / craving management  Name: Judy [name] Date of Birth: 1980-12-04  MR: [id number]    Level of Participation: active Quality of Participation: attentive, cooperative, engaged, and offered feedback Interactions with others: gave feedback Mood/Affect: appropriate and tearful Triggers (if applicable): the unknown and being uncomfortable; reports she results to her default setting of running  Cognition: coherent/clear, fearful, and insightful Progress: Gaining insight Response: Patient participated in group on today. Patient expressed understanding of group rules and confidentiality. Patient was able to mention her first thought regarding certain words and stigmas attached to mental health and substance use. Patient reports this activity challenged her to think differently about her current circumstance and to reconsider options for coping with said stigmas. Patient reports being fearful of the unknown. Patient reports needing a willpower higher than herself to keep her here and focused on treatment. Patient interacted positively with his peers and staff. No issues to report.  Plan: referral /  recommendations  Patients Problems:  Patient Active Problem List   Diagnosis Date Noted   Hypokalemia 09/15/2022   Polysubstance abuse (HCC) 09/15/2022   Substance induced mood disorder (HCC)    Suicidal ideation    Convulsions/seizures (HCC) 03/25/2014   Seizure (HCC) 03/25/2014   Third trimester pregnancy 11/16/2013   Generalized abdominal pain 11/16/2013   Alcohol abuse 11/16/2013   Cocaine abuse (HCC) 11/16/2013   Depressive disorder 03/19/2013   Alcohol dependence (HCC) 02/10/2013   Cocaine dependence (HCC) 02/10/2013

## 2022-09-24 NOTE — Discharge Instructions (Signed)
Follow-up recommendations:  Activity:  Normal, as tolerated Diet:  Per PCP recommendation  Patient is instructed prior to discharge to: Take all medications as prescribed by her mental healthcare provider. Report any adverse effects and/or reactions from the medicines to her outpatient provider promptly. Patient has been instructed & cautioned: To not engage in alcohol and or illegal drug use while on prescription medicines.  In the event of worsening symptoms, patient is instructed to call the crisis hotline at 988, 911 and or go to the nearest ED for appropriate evaluation and treatment of symptoms. To follow-up with her primary care provider for your other medical issues, concerns and or health care needs.  

## 2022-09-24 NOTE — ED Provider Notes (Signed)
[facility] Continuous Assessment Admission H&P  Date: 09/24/22 Patient Name: Judy [name] MRN: [id number] Chief Complaint:"I'm here for some substance abuse and depression"  Diagnoses:  Final diagnoses:  Polysubstance abuse (HCC)  Other depression    HPI: Judy [name] is a 42 year old female with psychiatric history of reported bipolar disorder, alcohol use disorder, and cocaine use disorder with multiple hospitalizations (recently hospitalized for hypokalemia and left AMA on 6/24)  who initially presented to the [facility] for substance use treatment 09/23/22 and transferred to the ED for medical clearance.  Patient was seen face to face by this provider and chart reviewed. Per chart review, patient was here yesterday 09/23/22 and stated that she was wanting rehabilitation services at [facility]. At that time she said she stopped using cocaine and drinking alcohol on Thursday and refused to have labs drawn or UDS and was transferred to the ED for medical clearance.   On evaluation, patient is observed deeply asleep, but aroused by voice, and cooperative. Speech is slow and muffled at times. Pt appears disheveled. Eye contact is minimal. Mood is depressed, affect is flat and congruent with mood. Thought process is goal directed and thought content is wdl. Pt denies SI/HI/AVH. There is no objective indication that the patient is responding to internal stimuli. No delusions elicited during this assessment.      Patient reports "I'm here for some substance abuse and depression".  Patient has difficulty concentrating during the evaluation, but reports she is having suicidal thoughts but no plan, stating "I am not going to do it". Patient reports she is having anxiety, shakes, and I've been drinking alcohol, I drink all day everyday, and last drank yesterday". No withdrawal symptoms present.  Patient also endorses crack cocaine use stating "I started doing crack at age 7 and alcohol at 63. Patient  also endorsed feeling depressed but is unable to describe her depressive symptoms.  Provider unable to continue with this evaluation as patient is now fast asleep and mumbling when asked questions.  Recommend reeval in the a.m. patient is fully alert.  Total Time spent with patient: 15 minutes  Musculoskeletal  Strength & Muscle Tone: within normal limits Gait & Station: normal Patient leans: N/A  Psychiatric Specialty Exam  Presentation General Appearance:  Disheveled  Eye Contact: Minimal  Speech: Slow  Speech Volume: Normal  Handedness: Right   Mood and Affect  Mood: Depressed  Affect: Flat; Non-Congruent   Thought Process  Thought Processes: Goal Directed  Descriptions of Associations:Loose  Orientation:Partial  Thought Content:WDL  Diagnosis of Schizophrenia or Schizoaffective disorder in past: No   Hallucinations:Hallucinations: None  Ideas of Reference:None  Suicidal Thoughts:Suicidal Thoughts: No  Homicidal Thoughts:Homicidal Thoughts: No   Sensorium  Memory: Immediate Fair  Judgment: Poor  Insight: Poor   Executive Functions  Concentration: Poor  Attention Span: Poor  Recall: Poor  Fund of Knowledge: Poor  Language: Poor   Psychomotor Activity  Psychomotor Activity: Psychomotor Activity: Normal   Assets  Assets: Desire for Improvement   Sleep  Sleep: Sleep: Fair   Nutritional Assessment (For OBS and FBC admissions only) Has the patient had a weight loss or gain of 10 pounds or more in the last 3 months?: No Has the patient had a decrease in food intake/or appetite?: No Does the patient have dental problems?: No Does the patient have eating habits or behaviors that may be indicators of an eating disorder including binging or inducing vomiting?: No Has the patient recently lost weight  without trying?: 0 Has the patient been eating poorly because of a decreased appetite?: 0 Malnutrition Screening Tool  Score: 0    Physical Exam Constitutional:      General: She is not in acute distress.    Appearance: She is not diaphoretic.  HENT:     Head: Normocephalic.     Right Ear: External ear normal.     Left Ear: External ear normal.     Nose: No congestion.  Eyes:     General:        Right eye: No discharge.        Left eye: No discharge.  Cardiovascular:     Rate and Rhythm: Normal rate.  Pulmonary:     Effort: No respiratory distress.  Chest:     Chest wall: No tenderness.  Neurological:     Mental Status: She is alert. Mental status is at baseline.  Psychiatric:        Attention and Perception: Attention and perception normal.        Mood and Affect: Mood is depressed. Affect is flat.        Speech: Speech normal.        Behavior: Behavior is cooperative.        Thought Content: Thought content is not paranoid or delusional. Thought content includes suicidal ideation. Thought content does not include homicidal ideation. Thought content does not include homicidal or suicidal plan.    Review of Systems  Constitutional:  Negative for chills, diaphoresis and fever.  HENT:  Negative for congestion.   Eyes:  Negative for discharge.  Respiratory:  Negative for cough, shortness of breath and wheezing.   Cardiovascular:  Negative for chest pain and palpitations.  Gastrointestinal:  Negative for diarrhea, nausea and vomiting.  Neurological:  Negative for dizziness, seizures, loss of consciousness, weakness and headaches.  Psychiatric/Behavioral:  Positive for depression, substance abuse and suicidal ideas.     Blood pressure 130/83, pulse 83, temperature 98.3 F (36.8 C), resp. rate 18, SpO2 100 %, unknown if currently breastfeeding. There is no height or weight on file to calculate BMI.  Past Psychiatric History: See H & P   Is the patient at risk to self?  Unable to determine Has the patient been a risk to self in the past 6 months?  Unknown .    Has the patient been a risk  to self within the distant past? Yes   Is the patient a risk to others? No   Has the patient been a risk to others in the past 6 months? No   Has the patient been a risk to others within the distant past? No   Past Medical History: See Chart  Family History: N/A  Social History: N/A  Last Labs:  Admission on 09/23/2022, Discharged on 09/24/2022  Component Date Value Ref Range Status   WBC 09/23/2022 8.4  4.0 - 10.5 K/uL Final   RBC 09/23/2022 5.41 (H)  3.87 - 5.11 MIL/uL Final   Hemoglobin 09/23/2022 13.9  12.0 - 15.0 g/dL Final   HCT 16/12/9602 44.4  36.0 - 46.0 % Final   MCV 09/23/2022 82.1  80.0 - 100.0 fL Final   MCH 09/23/2022 25.7 (L)  26.0 - 34.0 pg Final   MCHC 09/23/2022 31.3  30.0 - 36.0 g/dL Final   RDW 54/11/8117 17.2 (H)  11.5 - 15.5 % Final   Platelets 09/23/2022 377  150 - 400 K/uL Final   nRBC 09/23/2022 0.0  0.0 -  0.2 % Final   Neutrophils Relative % 09/23/2022 59  % Final   Neutro Abs 09/23/2022 4.9  1.7 - 7.7 K/uL Final   Lymphocytes Relative 09/23/2022 25  % Final   Lymphs Abs 09/23/2022 2.1  0.7 - 4.0 K/uL Final   Monocytes Relative 09/23/2022 11  % Final   Monocytes Absolute 09/23/2022 0.9  0.1 - 1.0 K/uL Final   Eosinophils Relative 09/23/2022 4  % Final   Eosinophils Absolute 09/23/2022 0.3  0.0 - 0.5 K/uL Final   Basophils Relative 09/23/2022 1  % Final   Basophils Absolute 09/23/2022 0.1  0.0 - 0.1 K/uL Final   Immature Granulocytes 09/23/2022 0  % Final   Abs Immature Granulocytes 09/23/2022 0.03  0.00 - 0.07 K/uL Final   Performed at [facility], [address] [place], [place] [address]   Sodium 09/23/2022 136  135 - 145 mmol/L Final   Potassium 09/23/2022 2.9 (L)  3.5 - 5.1 mmol/L Final   Chloride 09/23/2022 88 (L)  98 - 111 mmol/L Final   CO2 09/23/2022 29  22 - 32 mmol/L Final   Glucose, Bld 09/23/2022 86  70 - 99 mg/dL Final   Glucose reference range applies only to samples taken after fasting for at least 8 hours.   BUN 09/23/2022  13  6 - 20 mg/dL Final   Creatinine, Ser 09/23/2022 1.06 (H)  0.44 - 1.00 mg/dL Final   Calcium 60/45/4098 10.0  8.9 - 10.3 mg/dL Final   Total Protein 11/91/4782 8.1  6.5 - 8.1 g/dL Final   Albumin 95/62/1308 4.4  3.5 - 5.0 g/dL Final   AST 65/78/4696 40  15 - 41 U/L Final   ALT 09/23/2022 27  0 - 44 U/L Final   Alkaline Phosphatase 09/23/2022 71  38 - 126 U/L Final   Total Bilirubin 09/23/2022 <0.1 (L)  0.3 - 1.2 mg/dL Final   GFR, Estimated 09/23/2022 >60  >60 mL/min Final   Comment: (NOTE) Calculated using the CKD-EPI Creatinine Equation (2021)    Anion gap 09/23/2022 19 (H)  5 - 15 Final   Performed at [facility], [address] [place], [place] [address]   Magnesium 09/23/2022 2.3  1.7 - 2.4 mg/dL Final   Performed at [facility], [address], [place], [place] [address]   Yeast Wet Prep HPF POC 09/23/2022 NONE SEEN  NONE SEEN Final   Trich, Wet Prep 09/23/2022 NONE SEEN  NONE SEEN Final   Clue Cells Wet Prep HPF POC 09/23/2022 PRESENT (A)  NONE SEEN Final   WBC, Wet Prep HPF POC 09/23/2022 >=10 (A)  <10 Final   Sperm 09/23/2022 NONE SEEN   Final   Performed at [facility], [address], [place], [place] [address]   Alcohol, Ethyl (B) 09/23/2022 <10  <10 mg/dL Final   Comment: (NOTE) Lowest detectable limit for serum alcohol is 10 mg/dL.  For medical purposes only. Performed at [facility], [address] [place], [place] [address]    Lipase 09/23/2022 31  11 - 51 U/L Final   Performed at [facility], [address] [place], [place] [address]   Sodium 09/23/2022 139  135 - 145 mmol/L Final   Potassium 09/23/2022 3.7  3.5 - 5.1 mmol/L Final   Chloride 09/23/2022 95 (L)  98 - 111 mmol/L Final   CO2 09/23/2022 36 (H)  22 - 32 mmol/L Final   Glucose, Bld 09/23/2022  77  70 - 99 mg/dL Final   Glucose reference range applies only to samples taken after fasting for at least 8 hours.   BUN 09/23/2022 14  6 - 20 mg/dL Final   Creatinine,  Ser 09/23/2022 1.33 (H)  0.44 - 1.00 mg/dL Final   Calcium 16/12/9602 9.2  8.9 - 10.3 mg/dL Final   GFR, Estimated 09/23/2022 51 (L)  >60 mL/min Final   Comment: (NOTE) Calculated using the CKD-EPI Creatinine Equation (2021)    Anion gap 09/23/2022 8  5 - 15 Final   Performed at [facility], [address], [place], [place] [address]  Admission on 09/23/2022, Discharged on 09/23/2022  Component Date Value Ref Range Status   POC Amphetamine UR 09/23/2022 None Detected  NONE DETECTED (Cut Off Level 1000 ng/mL) Final   POC Secobarbital (BAR) 09/23/2022 None Detected  NONE DETECTED (Cut Off Level 300 ng/mL) Final   POC Buprenorphine (BUP) 09/23/2022 None Detected  NONE DETECTED (Cut Off Level 10 ng/mL) Final   POC Oxazepam (BZO) 09/23/2022 None Detected  NONE DETECTED (Cut Off Level 300 ng/mL) Final   POC Cocaine UR 09/23/2022 Positive (A)  NONE DETECTED (Cut Off Level 300 ng/mL) Final   POC Methamphetamine UR 09/23/2022 None Detected  NONE DETECTED (Cut Off Level 1000 ng/mL) Final   POC Morphine 09/23/2022 None Detected  NONE DETECTED (Cut Off Level 300 ng/mL) Final   POC Methadone UR 09/23/2022 None Detected  NONE DETECTED (Cut Off Level 300 ng/mL) Final   POC Oxycodone UR 09/23/2022 None Detected  NONE DETECTED (Cut Off Level 100 ng/mL) Final   POC Marijuana UR 09/23/2022 None Detected  NONE DETECTED (Cut Off Level 50 ng/mL) Final  Admission on 09/14/2022, Discharged on 09/16/2022  Component Date Value Ref Range Status   Sodium 09/14/2022 139  135 - 145 mmol/L Final   Potassium 09/14/2022 2.0 (LL)  3.5 - 5.1 mmol/L Final   CRITICAL RESULT CALLED TO, READ BACK BY AND VERIFIED WITH BERRIER, C. RN @ 0005 09/15/22 JBUTLER   Chloride 09/14/2022 90 (L)  98 - 111 mmol/L Final   CO2 09/14/2022 34 (H)  22 - 32 mmol/L Final   Glucose, Bld 09/14/2022 106 (H)  70 - 99 mg/dL Final   Glucose reference range applies only to samples taken after fasting for at least 8 hours.   BUN 09/14/2022 8  6 -  20 mg/dL Final   Creatinine, Ser 09/14/2022 0.91  0.44 - 1.00 mg/dL Final   Calcium 11/91/4782 9.4  8.9 - 10.3 mg/dL Final   Total Protein 95/62/1308 6.9  6.5 - 8.1 g/dL Final   Albumin 65/78/4696 3.5  3.5 - 5.0 g/dL Final   AST 29/52/8413 35  15 - 41 U/L Final   ALT 09/14/2022 24  0 - 44 U/L Final   Alkaline Phosphatase 09/14/2022 64  38 - 126 U/L Final   Total Bilirubin 09/14/2022 0.3  0.3 - 1.2 mg/dL Final   GFR, Estimated 09/14/2022 >60  >60 mL/min Final   Comment: (NOTE) Calculated using the CKD-EPI Creatinine Equation (2021)    Anion gap 09/14/2022 15  5 - 15 Final   Performed at [facility], [address] [place], [place] [address]   Alcohol, Ethyl (B) 09/15/2022 12 (H)  <10 mg/dL Final   Comment: (NOTE) Lowest detectable limit for serum alcohol is 10 mg/dL.  For medical purposes only. Performed at [facility], [address] [place], [place] [address]  WBC 09/14/2022 6.7  4.0 - 10.5 K/uL Final   RBC 09/14/2022 4.33  3.87 - 5.11 MIL/uL Final   Hemoglobin 09/14/2022 10.9 (L)  12.0 - 15.0 g/dL Final   HCT 16/12/9602 34.8 (L)  36.0 - 46.0 % Final   MCV 09/14/2022 80.4  80.0 - 100.0 fL Final   MCH 09/14/2022 25.2 (L)  26.0 - 34.0 pg Final   MCHC 09/14/2022 31.3  30.0 - 36.0 g/dL Final   RDW 54/11/8117 16.8 (H)  11.5 - 15.5 % Final   Platelets 09/14/2022 326  150 - 400 K/uL Final   nRBC 09/14/2022 0.0  0.0 - 0.2 % Final   Performed at [facility], [address], [place], [place] [address]   I-stat hCG, quantitative 09/14/2022 <5.0  <5 mIU/mL Final   Comment 3 09/14/2022          Final   Comment:   GEST. AGE      CONC.  (mIU/mL)   <=1 WEEK        5 - 50     2 WEEKS       50 - 500     3 WEEKS       100 - 10,000     4 WEEKS     1,000 - 30,000        FEMALE AND NON-PREGNANT FEMALE:     LESS THAN 5 mIU/mL    Troponin I (High Sensitivity) 09/14/2022 6  <18 ng/L Final   Comment: (NOTE) Elevated high sensitivity troponin I (hsTnI) values and  significant  changes across serial measurements may suggest ACS but many other  chronic and acute conditions are known to elevate hsTnI results.  Refer to the "Links" section for chest pain algorithms and additional  guidance. Performed at [facility], [address], [place], [place] [address]    Troponin I (High Sensitivity) 09/15/2022 5  <18 ng/L Final   Comment: (NOTE) Elevated high sensitivity troponin I (hsTnI) values and significant  changes across serial measurements may suggest ACS but many other  chronic and acute conditions are known to elevate hsTnI results.  Refer to the "Links" section for chest pain algorithms and additional  guidance. Performed at [facility], [address] [place], [place] [address]    Magnesium 09/15/2022 1.9  1.7 - 2.4 mg/dL Final   Performed at [facility], [address] [place], [place] [address]   Lactic Acid, Venous 09/15/2022 1.6  0.5 - 1.9 mmol/L Final   Performed at [facility], [address], [place], [place] [address]   Lactic Acid, Venous 09/15/2022 1.4  0.5 - 1.9 mmol/L Final   Performed at [facility], [address], [place], [place] [address]   HIV Screen 4th Generation wRfx 09/15/2022 Non Reactive  Non Reactive Final   Performed at [facility], [address], [place], [place] [address]   WBC 09/15/2022 6.2  4.0 - 10.5 K/uL Final   RBC 09/15/2022 4.43  3.87 - 5.11 MIL/uL Final   Hemoglobin 09/15/2022 11.4 (L)  12.0 - 15.0 g/dL Final   HCT 27/25/3664 36.1  36.0 - 46.0 % Final   MCV 09/15/2022 81.5  80.0 - 100.0 fL Final   MCH 09/15/2022 25.7 (L)  26.0 - 34.0 pg Final   MCHC 09/15/2022 31.6  30.0 - 36.0 g/dL Final   RDW 40/34/7425 17.0 (H)  11.5 - 15.5 % Final  Platelets 09/15/2022 311  150 - 400 K/uL Final   nRBC 09/15/2022 0.0  0.0 - 0.2 % Final   Performed at [facility], [address], [place], [place] [address]   Sodium 09/15/2022 137  135 - 145 mmol/L Final   Potassium  09/15/2022 2.5 (LL)  3.5 - 5.1 mmol/L Final   CRITICAL RESULT CALLED TO, READ BACK BY AND VERIFIED WITH O.STINE RN 1038 09/15/22 MCCORMICK K   Chloride 09/15/2022 91 (L)  98 - 111 mmol/L Final   CO2 09/15/2022 34 (H)  22 - 32 mmol/L Final   Glucose, Bld 09/15/2022 80  70 - 99 mg/dL Final   Glucose reference range applies only to samples taken after fasting for at least 8 hours.   BUN 09/15/2022 10  6 - 20 mg/dL Final   Creatinine, Ser 09/15/2022 0.88  0.44 - 1.00 mg/dL Final   Calcium 96/29/5284 9.3  8.9 - 10.3 mg/dL Final   GFR, Estimated 09/15/2022 >60  >60 mL/min Final   Comment: (NOTE) Calculated using the CKD-EPI Creatinine Equation (2021)    Anion gap 09/15/2022 12  5 - 15 Final   Performed at [facility], [address] [place], [place] [address]   Phosphorus 09/16/2022 2.2 (L)  2.5 - 4.6 mg/dL Final   Performed at [facility], [address], [place], [place] [address]   Vitamin B-12 09/16/2022 198  180 - 914 pg/mL Final   Comment: (NOTE) This assay is not validated for testing neonatal or myeloproliferative syndrome specimens for Vitamin B12 levels. Performed at [facility], [address] [place], [place] [address]    TSH 09/16/2022 0.353  0.350 - 4.500 uIU/mL Final   Comment: Performed by a 3rd Generation assay with a functional sensitivity of <=0.01 uIU/mL. Performed at [facility], [address] [place], [place] [address]     Allergies: Metoclopramide, Cephalosporins, Penicillins, and Sulfa antibiotics  Medications:  Facility Ordered Medications  Medication   acetaminophen (TYLENOL) tablet 650 mg   alum & mag hydroxide-simeth (MAALOX/MYLANTA) 200-200-20 MG/5ML suspension 30 mL   magnesium hydroxide (MILK OF MAGNESIA) suspension 30 mL   hydrOXYzine (ATARAX) tablet 25 mg   ziprasidone (GEODON) injection 20 mg   And   LORazepam (ATIVAN) tablet 1 mg      Medical Decision Making  Recommend admission to the continuous observation  unit overnight for safety monitoring and reeval in the a.m.  Reviewed labs completed at the ED per EDP:CBC without acute findings, CMP shows hypokalemia with a creatinine of 2.9 and mild AKI with creatinine of 1.06 from her baseline of 0.8. Anion gap of 19. Patient's magnesium is normal. Potassium replaced with IV and oral. Currently at 3.7, Alcohol level was negative. Patient did recently have HIV testing done last week which was negative but will send RPR as she does report unprotected sex and intermittent discharge.   Recommend agitation protocol meds -Geodon 20 mg and every 12 hours as needed for agitation and Ativan 1 mg p.o., as needed, anxiety, severe agitation for 1 dose.  Other PRNs -Tylenol 650 mg p.o. every 6 hours as needed pain -Maalox 30 mL p.o. every 4 hours as needed indigestion -Atarax 25 mg p.o. 3 times daily as needed anxiety -MOM 30 mL p.o. daily as needed constipation   Recommendations  Based on my evaluation the patient does not appear to have an emergency medical condition.  Recommend admission to the continuous observation unit overnight  for safety monitoring and re-eval in the a.m.  Mancel Bale, NP 09/24/22  3:39 AM

## 2022-09-24 NOTE — ED Provider Notes (Addendum)
FBC/OBS ASAP Discharge Summary  Date and Time: 09/24/2022 10:16 AM  Name: TEDDIE HELVEY  MRN:  [id number]   Discharge Diagnoses:  Final diagnoses:  Polysubstance abuse (HCC)  Hypokalemia  Bipolar disorder, in partial remission, most recent episode depressed (HCC)    Subjective: Nyemah Errigo is a 42 year old female with psychiatric history of bipolar 1 disorder, alcohol use disorder, and cocaine use disorder with multiple hospitalizations (recently hospitalized for hypokalemia and left AMA on 6/24)  who initially presented to the [facility] for substance use treatment 09/23/22 and transferred to the ED for medical clearance.  Patient returned to [facility] overnight after medical clearance.  Patient was seen in exam room today.  She is pleasant and cooperative, stating she feels tired due to not having much sleep last night with the hospital transfer.  She is asking to complete detox in the [facility] along with further rehabilitation services.  Of note, patient came on 7/2 with her church friend seeking substance use treatment; however, at that time, she did not feel ready and refused labs to assess for medical stability and a UDS.  When asked what changed since that time, she states "I am tired of feeling this way.  I know when I stop using cocaine I am severely depressed and I think that is why I start using again and I also crave it.  I also ran out of money buying substances".  She also remembers a period of sobriety in which she is motivated to have again.  She denies SI and states she just feels an immense amount of depression.  Denies HI, AVH.  At this time, she also reports anhedonia, feelings of guilt and hopelessness, poor energy and concentration.  She reports fair sleep and appetite.  She reports cravings at this time.  She has used cocaine since the age of 50, reports using $400 worth of cocaine daily and most recently used yesterday.  She drinks about $600 worth daily of alcohol.  She denies having a  history of withdrawal seizures (however on chart review, she has reported history of this).  She reports having history of DTs (on chart review, it states that the providers did not see DTs during her inpatient hospital stays and seems to be more so withdrawal symptoms of tremors, nausea and vomiting, and agitation).  She denies using other illicit substances.   Stay Summary: Patient arrived 7/3 seeking substance use treatment.  Due to previously being admitted to the medical hospital for hypokalemia of 2 and leaving AMA, she was at high risk of medical decompensation so she was sent to the ED for medical clearance.  Potassium was repleted and normalized to 3.7 and she was medically cleared to return to [facility].  Overnight, she was given 40 mEq of potassium chloride.  Patient is now voluntarily going to the [facility] for further substance use treatment including detox and getting connected to a rehabilitation program.  Total Time spent with patient: 30 minutes  Past Psychiatric History: Dx: Bipolar 1 disorder, alcohol use disorder, cocaine use disorder Current medications: Not currently taking psychiatric medications. When hospitalized from 6/24 - 6/26 she was placed back on previous psychiatric medication including Paxil, Lamictal, Abilify, Wellbutrin, and Seroquel however when she left AMA she was unable to continue the medications Previous medications: Remeron (had vivid dreams on this medication involving using substances), Lamictal, Latuda, Paxil, Abilify, Wellbutrin, Seroquel, Prozac Psychiatrist: Denies Therapist: Denies Hospitalizations: Denies History of SI: Denies History of self harm: Denies  Past  Medical History: Dx: Reports kidney disease in the past (per chart review patient has stage III CKD), history of Graves' disease (last TSH on 08/2022 was WNL) Medications: Not currently taking medications  Family History: Diabetes, CKD  Family Psychiatric  History: MDD-mother  Social History: Living:  Unhoused, living in a park in [place] Job: Currently on disability Married/Children: Single, states she recently had a complicated relationship with a partner named Maisie Fus, 2 children- one in their 48s and one 17 years old and they are both adopted Support: Teacher, adult education History: Previously on parole but no current outstanding charges  Substance History:  Smoking: Daily smoker, quarter pack a day for the past 10 years Alcohol:   Onset: 42 years old, started regularly using at 42 years old  Amount and frequency: Unclear but states she drinks 100s of dollars worth of alcohol  Last use: Yesterday  Hx of withdrawal symptoms: Reports history of DTs however per chart review providers have not seen DTs in her inpatient hospital stays previously, denies history of withdrawal seizures however per chart review reports withdrawal seizures so it is unclear  Periods of sobriety: Had periods of sobriety in the past Illicit drugs:  Cocaine: Onset was 42 years old, uses $400 worth of cocaine and most recently used yesterday Rehab: trialed multiple substance use rehabilitation programs in the past (per chart review, last time she was in a rehab center was in 03/2021 at [facility] Tobacco Cessation:  A prescription for an FDA-approved tobacco cessation medication provided at discharge  Current Medications:  Current Facility-Administered Medications  Medication Dose Route Frequency Provider Last Rate Last Admin   acetaminophen (TYLENOL) tablet 650 mg  650 mg Oral Q6H PRN Onuoha, Chinwendu V, NP       alum & mag hydroxide-simeth (MAALOX/MYLANTA) 200-200-20 MG/5ML suspension 30 mL  30 mL Oral Q4H PRN Onuoha, Chinwendu V, NP       hydrOXYzine (ATARAX) tablet 25 mg  25 mg Oral TID PRN Onuoha, Chinwendu V, NP       ziprasidone (GEODON) injection 20 mg  20 mg Intramuscular Q12H PRN Onuoha, Chinwendu V, NP       And   LORazepam (ATIVAN) tablet 1 mg  1 mg Oral PRN Onuoha, Chinwendu V, NP       magnesium  hydroxide (MILK OF MAGNESIA) suspension 30 mL  30 mL Oral Daily PRN Onuoha, Chinwendu V, NP       No current outpatient medications on file.    PTA Medications:  Facility Ordered Medications  Medication   acetaminophen (TYLENOL) tablet 650 mg   alum & mag hydroxide-simeth (MAALOX/MYLANTA) 200-200-20 MG/5ML suspension 30 mL   magnesium hydroxide (MILK OF MAGNESIA) suspension 30 mL   hydrOXYzine (ATARAX) tablet 25 mg   ziprasidone (GEODON) injection 20 mg   And   LORazepam (ATIVAN) tablet 1 mg   [COMPLETED] potassium chloride SA (KLOR-CON M) CR tablet 40 mEq        No data to display          Flowsheet Row ED from 09/24/2022 in [facility] Most recent reading at 09/24/2022  2:48 AM ED from 09/23/2022 in [facility] Most recent reading at 09/23/2022  9:12 PM ED from 09/23/2022 in [facility] Most recent reading at 09/23/2022  6:05 AM  C-SSRS RISK CATEGORY Low Risk No Risk Low Risk       Musculoskeletal  Strength & Muscle Tone: within normal  limits Gait & Station: normal Patient leans: N/A  Psychiatric Specialty Exam  Presentation  General Appearance:  Disheveled  Eye Contact: Fair  Speech: Garbled (patient has no teeth)  Speech Volume: Normal  Handedness: Right   Mood and Affect  Mood: Depressed; Labile (Would be crying and then smiling)  Affect: Congruent   Thought Process  Thought Processes: Goal Directed; Coherent  Descriptions of Associations:Intact  Orientation:Full (Time, Place and Person)  Thought Content:Logical  Diagnosis of Schizophrenia or Schizoaffective disorder in past: No    Hallucinations:Hallucinations: None  Ideas of Reference:None  Suicidal Thoughts:Suicidal Thoughts: No  Homicidal Thoughts:Homicidal Thoughts: No   Sensorium  Memory: Immediate Fair; Recent Fair; Remote Fair  Judgment: Poor  Insight: Poor   Executive  Functions  Concentration: Fair  Attention Span: Fair  Recall: Fair  Fund of Knowledge: Fair  Language: Fair   Psychomotor Activity  Psychomotor Activity: Psychomotor Activity: Normal   Assets  Assets: Resilience   Sleep  Sleep: Sleep: Fair   Nutritional Assessment (For OBS and FBC admissions only) Has the patient had a weight loss or gain of 10 pounds or more in the last 3 months?: No Has the patient had a decrease in food intake/or appetite?: No Does the patient have dental problems?: Yes Does the patient have eating habits or behaviors that may be indicators of an eating disorder including binging or inducing vomiting?: No Has the patient recently lost weight without trying?: 0 Has the patient been eating poorly because of a decreased appetite?: 0 Malnutrition Screening Tool Score: 0    Physical Exam  Physical Exam Vitals reviewed.  Cardiovascular:     Rate and Rhythm: Normal rate.  Pulmonary:     Effort: Pulmonary effort is normal.  Musculoskeletal:        General: Normal range of motion.  Neurological:     Mental Status: She is alert.    Review of Systems  Constitutional:  Negative for chills and fever.  Respiratory:  Negative for cough.   Cardiovascular:  Negative for chest pain and palpitations.  Gastrointestinal:  Negative for nausea and vomiting.  Neurological:  Negative for headaches.   Blood pressure (!) 101/51, pulse 66, temperature 98.9 F (37.2 C), temperature source Oral, resp. rate 18, SpO2 100 %, unknown if currently breastfeeding. There is no height or weight on file to calculate BMI.  Demographic Factors:  Low socioeconomic status, Living alone, and Unemployed  Loss Factors: Loss of significant relationship and Financial problems/change in socioeconomic status  Historical Factors: Family history of mental illness or substance abuse  Risk Reduction Factors:   Sense of responsibility to family  Continued Clinical Symptoms:   Bipolar Disorder:   Depressive phase Alcohol/Substance Abuse/Dependencies  Cognitive Features That Contribute To Risk:  Closed-mindedness    Suicide Risk:  Mild:  Suicidal ideation of limited frequency, intensity, duration, and specificity.  There are no identifiable plans, no associated intent, mild dysphoria and related symptoms, good self-control (both objective and subjective assessment), few other risk factors, and identifiable protective factors, including available and accessible social support.  Plan Of Care/Follow-up recommendations:  Patient will be admitted to [facility] for further substance use treatment.  Can be started on Librium taper liked the medications Lamictal and Abilify, however it is unclear how long she has been off Lamictal and may be difficult for her to maintain adherence on that medication.  She may benefit from gabapentin for alcohol cravings.  We will continue medication management when she is transferred to [facility].  Alcohol Abuse/Use Disorder Alcohol Withdrawal LFTs are WNL so patient can be started on a Librium taper -Librium taper -CIWA with Librium as needed for CIWA greater than 10 -Thiamine 100 mg IM first day and PO after that -Multivitamin with minerals daily -Tylenol 650 mg every 6 hours as needed for pain -Zofran 4 mg every 6 hours as needed for nausea or vomiting -Imodium 2 to 4 mg as needed for diarrhea or loose stools  -Maalox/Mylanta 30 mL every 4 hours as needed for indigestion -Milk of Mag 30 mL as needed for constipation    Disposition: FBC  Lance Muss, MD 09/24/2022, 10:16 AM

## 2022-09-24 NOTE — Progress Notes (Signed)
Pt is sitting in the hallway reading a book. No signs of acute distress noted or concerns voiced. Staff will monitor for pt's safety.

## 2022-09-24 NOTE — Discharge Instructions (Signed)
[facility] [address] [place], [place], [address] [phone] phone  New Patient Assessment/Therapy Walk-Ins:  Monday and Wednesday: 8 am until slots are full. Every 1st and 2nd Fridays of the month: 1 pm - 5 pm.  NO ASSESSMENT/THERAPY WALK-INS ON TUESDAYS OR THURSDAYS  New Patient Assessment/Medication Management Walk-Ins:  Monday - Friday:  8 am - 11 am.  For all walk-ins, we ask that you arrive by 7:30 am because patients will be seen in the order of arrival.  Availability is limited; therefore, you may not be seen on the same day that you walk-in.  Our goal is to serve and meet the needs of our community to the best of our ability.  SUBSTANCE USE TREATMENT for Medicaid and State Funded/IPRS  [facility] [address] [place], [place], [address] [phone] phone NOTE: [facility] is no longer offering IOP services.  Serves those who are low-income or have no insurance.  [facility] [address], [place], [place], [address] [phone] phone [phone] fax NOTE: Does have Substance Abuse-Intensive Outpatient Program (SAIOP) as well as transitional housing if eligible.  [facility] [address] [place], [place], [address] [phone] phone [phone] fax  [facility] [address] [place], [place], [address] [phone] phone [phone] fax  HALFWAY HOUSES:  [facility] [phone]  [facility] www.oxfordvacancies.com  12 STEP PROGRAMS:  Alcoholics Anonymous of [place] https://aagreensboronc.com/meeting  Narcotics Anonymous of [place] https://greensborona.org/meetings/  Al-Anon of [place], [place] www.greens[place]alanon.org/find-meetings.html  Nar-Anon https://nar-anon.org/find-a-meetin  List of Residential placements:   [facility] in [place]: [phone]  [facility]: [phone]  [facility]: [place], [place]  [phone]: Female and female facility; 30-day program: (uninsured and Medicaid such as [facility], [facility], [facility], partners)  [facility]: [phone]; men and women's facility; 28 days; Can have Medicaid tailored plan ([facility] or [facility])  [facility]: [phone] Angie or [place]; 28 day program; must be fully detox; tailored Medicaid or no insurance  [facility] in [place], [place]; [phone]; 28 day all males program; no insurance accepted  [facility] in [place]: [place] [phone] (no insurance or Medicaid only); 90 days; outpatient services but provide housing in apartments [place]  [facility] Admission: [phone]: Patient must complete phone screening for placement: [place], [place]; 6 month program; uninsured, Medicaid, and [facility] insurance.   [facility]: no insurance required; [phone]  [place] [facility]: [phone]; Intake: Robert; Must fill out application online; Victor Delay [phone] x [phone]  [facility] in [place], [place]: [phone]; Admissions Coordinators Mr. Dennis or David Gibson; 90 day program.  [facility]: [place], [place] [phone]; Co-Ed 9 month to a year program; Online application; Men entry fee is $500 (6-12months);  [facility]: [address] [place], [place] [address]; no fee or insurance required; minimum of 2 years; Highly structured; work based; Intake Coordinator is Chris [phone]  [facility] in [place], [place]: [phone]; Fax number is [phone]; website: www.[facility].org; Requires 3-6 page autobiography; 2 year program (18 months and then 6month transitional housing); Admission fee is $300; no insurance needed; work program  [facility] in [place], : Front Desk Staff: Reeci [phone]: They have a Men's Regenerations Program 6-9months. Free program; There is an initial $300 fee however, they are willing to work  with patients regarding that. Application is online.  [facility]: Admissions [phone] Benjamin Cox ext [phone]; Any 7-90 day program is out of pocket; 12   month program is free of charge; there is a $275 entry fee; Patient is responsible for own transportation 

## 2022-09-24 NOTE — Tx Team (Signed)
LCSW and MD met with patient to assess current mood, affect, physical state, and inquire about needs/goals while here in [facility] and after discharge. Patient reports she presented due to needing help with her substance use. Patient reports she has been using alcohol and crack cocaine since the age of 78. Patient reports one year period of sobriety about 2 years ago. Patient reports her default setting is to run when she is having cravings on using or drinking alcohol. Patient reports being scared of herself stating "I know I need to beat this but the devil does not want that for me". Patient reports she knows that she needs a higher source to help her get this this difficult time of detox. Patient reports she has social support, however has a tendency to push people away when she just wants to do things her way. Patient reports she has a mother that is in [place] area, a brother in [place], [place] and a sister [place], [place]. Patient reports having minimal support from siblings, however reports her mom is working to become guardian for her. Patient reports she is orginally from [place], [place] however moved to [place] which she reports has been her stomping ground since. Patient reports she has a strong church support and reports they have even told her that they would be willing to pay for her admission into a facility if needed. Patient reports a history of attending multiple rehabs including [facility], [facility], and [facility] for 6 months. Patient reports an interest in going to [facility] which is a 12 month program and reports her church is willing to assistance as needed. Patient reports she also receives about $972 in SSI each month and reports she does not have a card at this time so the money has been sitting at this time. Patient aware of current plan and LCSW process. Patient aware that LCSW will send referrals out for review and will follow up to provide updates as received.  Patient expressed understanding and appreciation of LCSW assistance. No other needs were reported at this time by patient.   Fernande Boyden, LCSW Clinical Social Worker [facility] Ph: [phone]

## 2022-09-24 NOTE — ED Notes (Signed)
Pt observed/assessed in recliner sleeping. RR even and unlabored, appearing in no noted distress. Environmental check complete, will continue to monitor for safety 

## 2022-09-25 DIAGNOSIS — R45851 Suicidal ideations: Secondary | ICD-10-CM | POA: Diagnosis not present

## 2022-09-25 DIAGNOSIS — F3175 Bipolar disorder, in partial remission, most recent episode depressed: Secondary | ICD-10-CM | POA: Diagnosis not present

## 2022-09-25 DIAGNOSIS — F1914 Other psychoactive substance abuse with psychoactive substance-induced mood disorder: Secondary | ICD-10-CM | POA: Diagnosis not present

## 2022-09-25 DIAGNOSIS — E876 Hypokalemia: Secondary | ICD-10-CM | POA: Diagnosis not present

## 2022-09-25 LAB — GC/CHLAMYDIA PROBE AMP (~~LOC~~) NOT AT ARMC
Chlamydia: NEGATIVE
Comment: NEGATIVE
Comment: NORMAL
Neisseria Gonorrhea: NEGATIVE

## 2022-09-25 MED ORDER — ENSURE ENLIVE PO LIQD
237.0000 mL | Freq: Three times a day (TID) | ORAL | Status: DC | PRN
  Administered 2022-09-25: 237 mL via ORAL

## 2022-09-25 MED ORDER — PROMETHAZINE HCL 25 MG/ML IJ SOLN: 12.5000 mg | Freq: Once | INTRAMUSCULAR | Status: DC

## 2022-09-25 NOTE — ED Notes (Signed)
Pt was provided breakfast this morning

## 2022-09-25 NOTE — Group Note (Signed)
Group Topic: Relaxation  Group Date: 09/25/2022 Start Time: 1000 End Time: 1030 Facilitators: Londell Moh, NT  Department: [facility]  Number of Participants: 7  Group Focus: daily focus Treatment Modality:  Psychoeducation Interventions utilized were leisure development Purpose: increase insight  Name: Judy [name] Date of Birth: 02-10-1981  MR: [id number]    Level of Participation: Pt did not attend group when asked Patients Problems:  Patient Active Problem List   Diagnosis Date Noted   Hypokalemia 09/15/2022   Polysubstance abuse (HCC) 09/15/2022   Substance induced mood disorder (HCC)    Suicidal ideation    Convulsions/seizures (HCC) 03/25/2014   Seizure (HCC) 03/25/2014   Third trimester pregnancy 11/16/2013   Generalized abdominal pain 11/16/2013   Alcohol abuse 11/16/2013   Cocaine abuse (HCC) 11/16/2013   Depressive disorder 03/19/2013   Alcohol dependence (HCC) 02/10/2013   Cocaine dependence (HCC) 02/10/2013

## 2022-09-25 NOTE — ED Notes (Signed)
Pt was provided lunch

## 2022-09-25 NOTE — ED Notes (Signed)
Patient was discharged to [facility]. Patient refused antiemetic medications. Patient had nausea and vomiting and a bowel movement in the shower prior to leaving. Patient was going through withdrawal symptoms. Patient took off when the paramedics attempted to transport patient to [facility].

## 2022-09-25 NOTE — Discharge Planning (Signed)
LCSW spoke with patient on this morning regarding disposition plans and to provide an update regarding conversation with Mrs. Kendal Hymen at [facility]. LCSW spoke with Mrs. Kendal Hymen who reports they currently do not have any beds available at this time. Mrs. Kendal Hymen reports the patient would not be able to be on any psychotropic medications for admissions. Mrs. Kendal Hymen advised LCSW to have patient call in for update to be provided and for patient to be informed about program. Number was provided to the patient for her follow up. Patient reports she really would like to discharge on today. Patient reports she signed a 72 hour discharge form on yesterday and plans to discharge on tomorrow at the latest. Brief counseling was provided to the patient and the patient proceeded to say "This place is just not for me". Patient and LCSW revisited conversation on admission and patient expressed understanding. Patient reports "well whatever you can do for me, I am willing to do". Patient asked if she could go to [facility], however was informed that [facility] does not accept her insurance. Patient is in agreement with referral being sent to [facility] for review. LCSW attempted to encourage patient to fill out an application for [facility] in [place], [place], however patient refused stating "stomping ground". Patient reports she is not willing to return to [facility] as this is where she started using substance. LCSW expressed understanding and informed her that options are limited at this time, however referral will be sent to [facility] for review and LCSW will follow up to provide updates as received. No other needs were reported at this time.   LCSW will continue to follow and provide support to patient while on FBC unit.   Fernande Boyden, LCSW Clinical Social Worker [facility] [facility] Ph: [phone]
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
## 2022-09-25 NOTE — ED Notes (Signed)
Patient denies SI/HI and AVH. Patient has been resting on and off throughout the day. Patient has taken her medications as schedules. Patient is being monitored for safety.
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
## 2022-09-25 NOTE — ED Notes (Signed)
Patient is sleeping. Respirations equal and unlabored, skin warm and dry. No change in assessment or acuity. Routine safety checks conducted according to facility protocol. Will continue to monitor for safety.   

## 2023-08-02 ENCOUNTER — Other Ambulatory Visit: Payer: Self-pay

## 2023-08-02 ENCOUNTER — Observation Stay (HOSPITAL_COMMUNITY)
Admission: EM | Admit: 2023-08-02 | Discharge: 2023-08-03 | Discharge status: Against Medical Advice | Payer: MEDICAID | Attending: Internal Medicine | Admitting: Internal Medicine

## 2023-08-02 ENCOUNTER — Encounter (HOSPITAL_COMMUNITY): Payer: Self-pay

## 2023-08-02 DIAGNOSIS — Z5321 Procedure and treatment not carried out due to patient leaving prior to being seen by health care provider: Secondary | ICD-10-CM | POA: Diagnosis not present

## 2023-08-02 DIAGNOSIS — F17210 Nicotine dependence, cigarettes, uncomplicated: Secondary | ICD-10-CM | POA: Insufficient documentation

## 2023-08-02 DIAGNOSIS — F32A Depression, unspecified: Secondary | ICD-10-CM | POA: Insufficient documentation

## 2023-08-02 DIAGNOSIS — R0789 Other chest pain: Secondary | ICD-10-CM

## 2023-08-02 DIAGNOSIS — F1920 Other psychoactive substance dependence, uncomplicated: Secondary | ICD-10-CM | POA: Diagnosis not present

## 2023-08-02 DIAGNOSIS — F1420 Cocaine dependence, uncomplicated: Secondary | ICD-10-CM | POA: Diagnosis not present

## 2023-08-02 DIAGNOSIS — F10239 Alcohol dependence with withdrawal, unspecified: Secondary | ICD-10-CM | POA: Diagnosis not present

## 2023-08-02 DIAGNOSIS — R001 Bradycardia, unspecified: Secondary | ICD-10-CM | POA: Insufficient documentation

## 2023-08-02 DIAGNOSIS — F10930 Alcohol use, unspecified with withdrawal, uncomplicated: Principal | ICD-10-CM

## 2023-08-02 DIAGNOSIS — F10939 Alcohol use, unspecified with withdrawal, unspecified: Secondary | ICD-10-CM | POA: Diagnosis present

## 2023-08-02 LAB — BASIC METABOLIC PANEL WITH GFR
Anion gap: 13 (ref 5–15)
BUN: <5 mg/dL — ABNORMAL LOW (ref 6–20)
CO2: 24 mmol/L (ref 22–32)
Calcium: 9.2 mg/dL (ref 8.9–10.3)
Chloride: 105 mmol/L (ref 98–111)
Creatinine, Ser: 0.88 mg/dL (ref 0.44–1.00)
GFR, Estimated: >60 mL/min (ref >60)
Glucose, Bld: 79 mg/dL (ref 70–99)
Potassium: 3.5 mmol/L (ref 3.5–5.1)
Sodium: 142 mmol/L (ref 135–145)

## 2023-08-02 LAB — CBC
HCT: 40.8 % (ref 36.0–46.0)
Hemoglobin: 12.9 g/dL (ref 12.0–15.0)
MCH: 27 pg (ref 26.0–34.0)
MCHC: 31.6 g/dL (ref 30.0–36.0)
MCV: 85.4 fL (ref 80.0–100.0)
Platelets: 193 10*3/uL (ref 150–400)
RBC: 4.78 MIL/uL (ref 3.87–5.11)
RDW: 24 % — ABNORMAL HIGH (ref 11.5–15.5)
WBC: 5.1 10*3/uL (ref 4.0–10.5)
nRBC: 0 % (ref 0.0–0.2)

## 2023-08-02 LAB — HCG, SERUM, QUALITATIVE: Preg, Serum: NEGATIVE

## 2023-08-02 LAB — TROPONIN I (HIGH SENSITIVITY): Troponin I (High Sensitivity): 3 ng/L (ref <18)

## 2023-08-02 NOTE — ED Notes (Signed)
 Pt refused EKG.

## 2023-08-02 NOTE — ED Notes (Signed)
 Pt sleeping in chair.

## 2023-08-02 NOTE — ED Notes (Signed)
 2nd Trop due @1955 ,-Extra DG drawn

## 2023-08-02 NOTE — ED Triage Notes (Signed)
 Pt came in via POV d/t requesting help with her ETOH & cocaine addiction. Pt states that she has recently left AMA from a hospital in [place] for ETOH withdrawal & came here to leave the [facility] are so she wouldn't "use" again. Pt states in triage that she is not having any withdrawal feelings now but is having cravings. A/Ox4, states she is having CP is worried her Potassium is low again.

## 2023-08-02 NOTE — ED Notes (Signed)
 Pt sleeping in chair/ no worsening complaints at this time

## 2023-08-02 NOTE — ED Notes (Signed)
 After being educated and encouraged patient refused 2nd trop

## 2023-08-03 ENCOUNTER — Observation Stay (HOSPITAL_COMMUNITY): Payer: MEDICAID

## 2023-08-03 ENCOUNTER — Emergency Department (HOSPITAL_COMMUNITY): Payer: MEDICAID

## 2023-08-03 ENCOUNTER — Encounter (HOSPITAL_COMMUNITY): Payer: Self-pay | Admitting: Internal Medicine

## 2023-08-03 DIAGNOSIS — F10939 Alcohol use, unspecified with withdrawal, unspecified: Secondary | ICD-10-CM | POA: Diagnosis present

## 2023-08-03 DIAGNOSIS — F32A Depression, unspecified: Secondary | ICD-10-CM | POA: Diagnosis not present

## 2023-08-03 DIAGNOSIS — R001 Bradycardia, unspecified: Secondary | ICD-10-CM | POA: Insufficient documentation

## 2023-08-03 LAB — BASIC METABOLIC PANEL WITH GFR
Anion gap: 11 (ref 5–15)
BUN: 7 mg/dL (ref 6–20)
CO2: 25 mmol/L (ref 22–32)
Calcium: 8.7 mg/dL — ABNORMAL LOW (ref 8.9–10.3)
Chloride: 101 mmol/L (ref 98–111)
Creatinine, Ser: 1 mg/dL (ref 0.44–1.00)
GFR, Estimated: >60 mL/min (ref >60)
Glucose, Bld: 108 mg/dL — ABNORMAL HIGH (ref 70–99)
Potassium: 3.7 mmol/L (ref 3.5–5.1)
Sodium: 137 mmol/L (ref 135–145)

## 2023-08-03 LAB — CBC WITH DIFFERENTIAL/PLATELET
Abs Immature Granulocytes: 0.03 10*3/uL (ref 0.00–0.07)
Basophils Absolute: 0 10*3/uL (ref 0.0–0.1)
Basophils Relative: 1 %
Eosinophils Absolute: 0.1 10*3/uL (ref 0.0–0.5)
Eosinophils Relative: 3 %
HCT: 38.4 % (ref 36.0–46.0)
Hemoglobin: 12 g/dL (ref 12.0–15.0)
Immature Granulocytes: 1 %
Lymphocytes Relative: 48 %
Lymphs Abs: 2.4 10*3/uL (ref 0.7–4.0)
MCH: 27 pg (ref 26.0–34.0)
MCHC: 31.3 g/dL (ref 30.0–36.0)
MCV: 86.5 fL (ref 80.0–100.0)
Monocytes Absolute: 0.9 10*3/uL (ref 0.1–1.0)
Monocytes Relative: 18 %
Neutro Abs: 1.4 10*3/uL — ABNORMAL LOW (ref 1.7–7.7)
Neutrophils Relative %: 29 %
Platelets: 183 10*3/uL (ref 150–400)
RBC: 4.44 MIL/uL (ref 3.87–5.11)
RDW: 24.2 % — ABNORMAL HIGH (ref 11.5–15.5)
Smear Review: NORMAL
WBC: 4.8 10*3/uL (ref 4.0–10.5)
nRBC: 0 % (ref 0.0–0.2)

## 2023-08-03 LAB — TSH: TSH: 0.301 u[IU]/mL — ABNORMAL LOW (ref 0.350–4.500)

## 2023-08-03 LAB — MAGNESIUM: Magnesium: 1.9 mg/dL (ref 1.7–2.4)

## 2023-08-03 LAB — HEPATIC FUNCTION PANEL
ALT: 16 U/L (ref 0–44)
AST: 25 U/L (ref 15–41)
Albumin: 2.9 g/dL — ABNORMAL LOW (ref 3.5–5.0)
Alkaline Phosphatase: 50 U/L (ref 38–126)
Bilirubin, Direct: 0.1 mg/dL (ref 0.0–0.2)
Indirect Bilirubin: 0.2 mg/dL — ABNORMAL LOW (ref 0.3–0.9)
Total Bilirubin: 0.3 mg/dL (ref 0.0–1.2)
Total Protein: 5.1 g/dL — ABNORMAL LOW (ref 6.5–8.1)

## 2023-08-03 MED ORDER — ADULT MULTIVITAMIN W/MINERALS CH
1.0000 | ORAL_TABLET | Freq: Every day | ORAL | Status: DC
  Administered 2023-08-03: 1 via ORAL
  Filled 2023-08-03: qty 1

## 2023-08-03 MED ORDER — FOLIC ACID 1 MG PO TABS
1.0000 mg | ORAL_TABLET | Freq: Every day | ORAL | Status: DC
  Administered 2023-08-03: 1 mg via ORAL
  Filled 2023-08-03: qty 1

## 2023-08-03 MED ORDER — ENOXAPARIN SODIUM 40 MG/0.4ML IJ SOSY
40.0000 mg | PREFILLED_SYRINGE | INTRAMUSCULAR | Status: DC
  Filled 2023-08-03: qty 0.4

## 2023-08-03 MED ORDER — MIRTAZAPINE 15 MG PO TABS: 15.0000 mg | ORAL_TABLET | Freq: Every evening | ORAL | Status: DC | PRN

## 2023-08-03 MED ORDER — LORAZEPAM 1 MG PO TABS: 0.0000 mg | ORAL_TABLET | Freq: Four times a day (QID) | ORAL | Status: DC

## 2023-08-03 MED ORDER — LORAZEPAM 2 MG/ML IJ SOLN
1.0000 mg | INTRAMUSCULAR | Status: DC | PRN
  Administered 2023-08-03: 1 mg via INTRAVENOUS
  Filled 2023-08-03: qty 1

## 2023-08-03 MED ORDER — THIAMINE MONONITRATE 100 MG PO TABS: 100.0000 mg | ORAL_TABLET | Freq: Every day | ORAL | Status: DC

## 2023-08-03 MED ORDER — LORAZEPAM 2 MG/ML IJ SOLN: 0.0000 mg | Freq: Two times a day (BID) | INTRAMUSCULAR | Status: DC

## 2023-08-03 MED ORDER — LORAZEPAM 1 MG PO TABS: 1.0000 mg | ORAL_TABLET | ORAL | Status: DC | PRN

## 2023-08-03 MED ORDER — LORAZEPAM 1 MG PO TABS
0.0000 mg | ORAL_TABLET | Freq: Two times a day (BID) | ORAL | Status: DC
Start: 2023-08-05 — End: 2023-08-03

## 2023-08-03 MED ORDER — SODIUM CHLORIDE 0.9 % IV SOLN
12.5000 mg | Freq: Four times a day (QID) | INTRAVENOUS | Status: DC | PRN
  Filled 2023-08-03: qty 0.5

## 2023-08-03 MED ORDER — LORAZEPAM 1 MG PO TABS: 0.0000 mg | ORAL_TABLET | Freq: Two times a day (BID) | ORAL | Status: DC

## 2023-08-03 MED ORDER — THIAMINE HCL 100 MG/ML IJ SOLN: 100.0000 mg | Freq: Every day | INTRAMUSCULAR | Status: DC

## 2023-08-03 MED ORDER — ARIPIPRAZOLE 10 MG PO TABS: 10.0000 mg | ORAL_TABLET | Freq: Every day | ORAL | Status: DC

## 2023-08-03 MED ORDER — THIAMINE HCL 100 MG/ML IJ SOLN
100.0000 mg | Freq: Every day | INTRAMUSCULAR | Status: DC
  Filled 2023-08-03: qty 2

## 2023-08-03 MED ORDER — THIAMINE HCL 100 MG/ML IJ SOLN
500.0000 mg | Freq: Once | INTRAVENOUS | Status: AC
  Administered 2023-08-03: 500 mg via INTRAVENOUS
  Filled 2023-08-03: qty 5

## 2023-08-03 MED ORDER — LORAZEPAM 2 MG/ML IJ SOLN
0.0000 mg | Freq: Four times a day (QID) | INTRAMUSCULAR | Status: DC
  Administered 2023-08-03: 2 mg via INTRAVENOUS
  Filled 2023-08-03: qty 1

## 2023-08-03 MED ORDER — LORAZEPAM 1 MG PO TABS
0.0000 mg | ORAL_TABLET | Freq: Four times a day (QID) | ORAL | Status: DC
  Administered 2023-08-03: 1 mg via ORAL
  Administered 2023-08-03: 2 mg via ORAL
  Filled 2023-08-03: qty 1
  Filled 2023-08-03: qty 2

## 2023-08-03 MED ORDER — ENOXAPARIN SODIUM 40 MG/0.4ML IJ SOSY: 40.0000 mg | PREFILLED_SYRINGE | INTRAMUSCULAR | Status: DC

## 2023-08-03 NOTE — Progress Notes (Addendum)
 Msg sent to [facility]. Patient c/o nausea &* vomiting. patient vomited 1000 mL emesis. Patient eating and drinking right after like normal. When asked if she wanted to wait to receive meds for nausea. Patient stated give her ginger ale and she could take morning meds. Patient demanding IV ativan . I gave her a dose of scheduled. 1 mg. Patient seems to be drowsy barely able to keep her eyes open, and is requesting something for pain. No expressions of pain apparent, no grimacing, shielding, etc. Patient is demanding to know why she was placed on clear liquids. Patient's explained that it is due to nausea and vomiting and her not tolerating diet. Patient is demanding to speak to provider about diet change. Provider called into the room reiterating need for diet. Patient is going out to nutrition room and trying to help herself and ask others for food. Despite you making her aware of clear liquid diet. She stated she was thinking about leaving. I believe she is calling her ride as we speak but not certain. Told her I needed her to sign a form & have her IV removed before she does so. Patient asked not to wander around in hall because she is a fall risk due to ativan .
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
## 2023-08-03 NOTE — ED Notes (Signed)
 Patient to xray.

## 2023-08-03 NOTE — Discharge Instructions (Signed)

## 2023-08-03 NOTE — ED Provider Notes (Signed)
 [facility] Provider Note   CSN: [id number] Arrival date & time: 08/02/23  1646     History  Chief Complaint  Patient presents with   Chest Pain   ETOH addiction assistance.    Judy [name] is a 43 y.o. female.  HPI     This is a 43 year old female who presents with alcohol  withdrawal symptoms, polysubstance abuse and chest pain.  Patient reports that she would like to detox and needs medical clearance to go to rehab.  She has detoxed multiple times before.  She reports history of alcohol  withdrawal and alcohol  withdrawal seizures.  Last drink was at 3 PM.  She states that she drinks $3-$400 witalcohol  daily.  She drinks "whenever I get my hands on."  Also reports cocaine abuse.  Denies SI or HI.  Reported chest pain to the triage nurse but would not further elaborate for me.  Refused EKG in triage.  Chart reviewed.  Multiple prior admissions to other hospitals for alcohol  withdrawal.  I do not see listed history of alcohol  withdrawal seizures although the patient does report this.  Home Medications Prior to Admission medications   Not on File      Allergies    Metoclopramide , Cephalosporins, Penicillins, and Sulfa  antibiotics    Review of Systems   Review of Systems  Cardiovascular:  Positive for chest pain.  Neurological:  Positive for tremors.  All other systems reviewed and are negative.   Physical Exam Updated Vital Signs BP 95/68   Pulse 63   Temp 98.2 F (36.8 C) (Oral)   Resp 16   SpO2 99%  Physical Exam Vitals and nursing note reviewed.  Constitutional:      Appearance: She is well-developed. She is ill-appearing.     Comments: Frail, chronically ill-appearing, nontoxic appearing  HENT:     Head: Normocephalic and atraumatic.  Eyes:     Pupils: Pupils are equal, round, and reactive to light.  Cardiovascular:     Rate and Rhythm: Normal rate and regular rhythm.     Heart sounds: Normal heart sounds.   Pulmonary:     Effort: Pulmonary effort is normal. No respiratory distress.     Breath sounds: No wheezing.  Abdominal:     Palpations: Abdomen is soft.  Musculoskeletal:     Cervical back: Neck supple.  Skin:    General: Skin is warm and dry.  Neurological:     Mental Status: She is alert and oriented to person, place, and time.     Comments: Tremulous  Psychiatric:        Mood and Affect: Mood is anxious.     ED Results / Procedures / Treatments   Labs (all labs ordered are listed, but only abnormal results are displayed) Labs Reviewed  BASIC METABOLIC PANEL WITH GFR - Abnormal; Notable for the following components:      Result Value   BUN <5 (*)    All other components within normal limits  CBC - Abnormal; Notable for the following components:   RDW 24.0 (*)    All other components within normal limits  HCG, SERUM, QUALITATIVE  TROPONIN I (HIGH SENSITIVITY)  TROPONIN I (HIGH SENSITIVITY)    EKG EKG Interpretation Date/Time:  Tuesday Aug 03 2023 01:11:12 EDT Ventricular Rate:  57 PR Interval:  114 QRS Duration:  104 QT Interval:  426 QTC Calculation: 414 R Axis:   87  Text Interpretation: Sinus bradycardia Otherwise normal ECG  When compared with ECG of 15-Sep-2022 02:51, PREVIOUS ECG IS PRESENT Confirmed by Donita Furrow ([id number]) on 08/03/2023 1:25:35 AM  Radiology DG Chest 2 View Result Date: 08/03/2023 CLINICAL DATA:  Chest pain EXAM: CHEST - 2 VIEW COMPARISON:  10/14/2022 FINDINGS: Cardiac shadow is within normal limits. The lungs are well aerated bilaterally. No focal infiltrate or effusion is seen. No bony abnormality is noted. IMPRESSION: No active cardiopulmonary disease. Electronically Signed   By: Violeta Grey M.D.   On: 08/03/2023 00:30    Procedures .Critical Care  Performed by: Rory Collard, MD Authorized by: Rory Collard, MD   Critical care provider statement:    Critical care time (minutes):  35   Critical care was necessary to  treat or prevent imminent or life-threatening deterioration of the following conditions: Alcohol  withdrawal.   Critical care was time spent personally by me on the following activities:  Development of treatment plan with patient or surrogate, discussions with consultants, evaluation of patient's response to treatment, examination of patient, ordering and review of laboratory studies, ordering and review of radiographic studies, ordering and performing treatments and interventions, pulse oximetry, re-evaluation of patient's condition and review of old charts    Angiocath insertion Performed by: Rory Collard  Consent: Verbal consent obtained. Risks and benefits: risks, benefits and alternatives were discussed Time out: Immediately prior to procedure a "time out" was called to verify the correct patient, procedure, equipment, support staff and site/side marked as required.  Preparation: Patient was prepped and draped in the usual sterile fashion.  Vein Location: right EJ   Gauge: 20  Normal blood return and flush without difficulty Patient tolerance: Patient tolerated the procedure well with no immediate complications.     Medications Ordered in ED Medications  LORazepam  (ATIVAN ) injection 0-4 mg (has no administration in time range)    Or  LORazepam  (ATIVAN ) tablet 0-4 mg (has no administration in time range)  LORazepam  (ATIVAN ) injection 0-4 mg (has no administration in time range)    Or  LORazepam  (ATIVAN ) tablet 0-4 mg (has no administration in time range)  thiamine  (VITAMIN B1) tablet 100 mg (has no administration in time range)    Or  thiamine  (VITAMIN B1) injection 100 mg (has no administration in time range)    ED Course/ Medical Decision Making/ A&P                                 Medical Decision Making Amount and/or Complexity of Data Reviewed Labs: ordered. Radiology: ordered.  Risk OTC drugs. Prescription drug management. Decision regarding  hospitalization.   This patient presents to the ED for concern of alcohol  withdrawal, this involves an extensive number of treatment options, and is a complaint that carries with it a high risk of complications and morbidity.  I considered the following differential and admission for this acute, potentially life threatening condition.  The differential diagnosis includes, withdrawal, polysubstance abuse, ACS, PE, pneumothorax, pneumonia  MDM:    This is a 43 year old female who presents requesting detox from alcohol .  Last drink was over 8 hours ago.  She is tremulous and anxious on my evaluation.  Vital signs are reassuring.  EKG shows no evidence of acute ischemia or arrhythmia.  Chest x-ray without pneumothorax or pneumonia.  Troponin negative.  Patient reports history of hypokalemia but today it is normal.  Metabolites are reassuring.  Patient was placed on CIWA protocol.  CIWA score  is 14.  This coupled with reported history of seizures, makes her a poor candidate for outpatient Librium  therapy.  Will plan for admission for medical evaluation and detox.  Have added original EtOH level and UDS.  (Labs, imaging, consults)  Labs: I Ordered, and personally interpreted labs.  The pertinent results include: CBC, BMP, troponin, EtOH, UDS  Imaging Studies ordered: I ordered imaging studies including chest x-ray I independently visualized and interpreted imaging. I agree with the radiologist interpretation  Additional history obtained from chart review.  External records from outside source obtained and reviewed including prior admissions  Cardiac Monitoring: The patient was maintained on a cardiac monitor.  If on the cardiac monitor, I personally viewed and interpreted the cardiac monitored which showed an underlying rhythm of: Sinus  Reevaluation: After the interventions noted above, I reevaluated the patient and found that they have :improved  Social Determinants of Health:  alcohol  abuse,  polysubstance abuse  Disposition: Admit  Co morbidities that complicate the patient evaluation  Past Medical History:  Diagnosis Date   Alcohol  abuse    Anxiety    Cocaine abuse (HCC)    Depression    Depression    Polysubstance abuse (HCC)      Medicines Meds ordered this encounter  Medications   OR Linked Order Group    LORazepam  (ATIVAN ) injection 0-4 mg     CIWA-AR < 5 =:   0 mg     CIWA-AR 5 -10 =:   1 mg     CIWA-AR 11 -15 =:   2 mg     CIWA-AR 16 -20 =:   3 mg     CIWA-AR 16 -20 =:   Recheck CIWA-AR in 1 hour; if > 20 notify MD     CIWA-AR > 20 =:   4 mg     CIWA-AR > 20 =:   Notify MD    LORazepam  (ATIVAN ) tablet 0-4 mg     CIWA-AR < 5 =:   0 mg     CIWA-AR 5 -10 =:   1 mg     CIWA-AR 11 -15 =:   2 mg     CIWA-AR 16 -20 =:   3 mg     CIWA-AR 16 -20 =:   Recheck CIWA-AR in 1 hour; if > 20 notify MD     CIWA-AR > 20 =:   4 mg     CIWA-AR > 20 =:   Notify MD   OR Linked Order Group    LORazepam  (ATIVAN ) injection 0-4 mg     CIWA-AR < 5 =:   0 mg     CIWA-AR 5 -10 =:   1 mg     CIWA-AR 11 -15 =:   2 mg     CIWA-AR 16 -20 =:   3 mg     CIWA-AR 16 -20 =:   Recheck CIWA-AR in 1 hour; if > 20 notify MD     CIWA-AR > 20 =:   4 mg     CIWA-AR > 20 =:   Notify MD    LORazepam  (ATIVAN ) tablet 0-4 mg     CIWA-AR < 5 =:   0 mg     CIWA-AR 5 -10 =:   1 mg     CIWA-AR 11 -15 =:   2 mg     CIWA-AR 16 -20 =:   3 mg     CIWA-AR 16 -20 =:   Recheck CIWA-AR in  1 hour; if > 20 notify MD     CIWA-AR > 20 =:   4 mg     CIWA-AR > 20 =:   Notify MD   OR Linked Order Group    thiamine  (VITAMIN B1) tablet 100 mg    thiamine  (VITAMIN B1) injection 100 mg    I have reviewed the patients home medicines and have made adjustments as needed  Problem List / ED Course: Problem List Items Addressed This Visit   None Visit Diagnoses       Alcohol  withdrawal syndrome without complication (HCC)    -  Primary     Atypical chest pain                       Final Clinical  Impression(s) / ED Diagnoses Final diagnoses:  Alcohol  withdrawal syndrome without complication (HCC)  Atypical chest pain    Rx / DC Orders ED Discharge Orders     None         Laconya Clere, Vonzella Guernsey, MD 08/03/23 0205

## 2023-08-03 NOTE — ED Notes (Signed)
 Nurse put in a IV consult due to pt only having EJ in place. EJ would flush great with no pain to pt. IV team said they don't start Ivs for blood draw lab should be contacted, IV team said EJ is good enough IV. Nurse didn't want to cause the IV to infiltrate so stop missing with it.

## 2023-08-03 NOTE — H&P (Signed)
 History and Physical    Judy [name] BJY:[id number] DOB: 1981/02/23 DOA: 08/02/2023  Patient coming from: Home.  Chief Complaint: Alcohol  detox.  HPI: Judy [name] is a 43 y.o. female with history of polysubstance abuse including alcohol , cocaine and history of depression presents to the ER with requesting to be placed on alcohol  detox.  Patient states that she drinks all kind of alcohol  including wine and liquor.  Last drink was yesterday afternoon.  Admits to taking cocaine.  ED Course: In the ER patient is found to be tremulous and concerning for alcohol  withdrawal.  EKG shows sinus bradycardia with heart rate around 57 bpm.  Review of Systems: As per HPI, rest all negative.   Past Medical History:  Diagnosis Date   Alcohol  abuse    Anxiety    Cocaine abuse (HCC)    Depression    Depression    Polysubstance abuse (HCC)     History reviewed. No pertinent surgical history.   reports that she has been smoking cigarettes. She has a 2.5 pack-year smoking history. She does not have any smokeless tobacco history on file. She reports current alcohol  use. She reports current drug use. Drugs: Cocaine and "Crack" cocaine.  Allergies  Allergen Reactions   Metoclopramide  Other (See Comments) and Nausea And Vomiting    jittery   Cephalosporins Hives   Penicillins Hives   Sulfa  Antibiotics Nausea Only    History reviewed. No pertinent family history.  Prior to Admission medications   Medication Sig Start Date End Date Taking? Authorizing Provider  ARIPiprazole (ABILIFY) 10 MG tablet Take 10 mg by mouth at bedtime. 06/11/23  Yes [provider]  lamoTRIgine (LAMICTAL) 25 MG tablet Take 1 tablet by mouth 2 (two) times daily. 05/12/23  Yes [provider]  mirtazapine  (REMERON ) 15 MG tablet Take 15 mg by mouth at bedtime as needed (for sleep). 03/25/23  Yes [provider]    Physical Exam: Constitutional: Moderately built and nourished. Vitals:    08/02/23 1714 08/02/23 2159 08/03/23 0130 08/03/23 0306  BP: 106/68 100/61 95/68 91/60   Pulse: 80 (!) 59 63 61  Resp: 17 16  16   Temp: 98.3 F (36.8 C) 98.2 F (36.8 C)  97.9 F (36.6 C)  TempSrc:  Oral  Oral  SpO2: 97% 99%  100%   Eyes: Anicteric no pallor. ENMT: No discharge from the ears eyes nose and mouth. Neck: No mass felt.  No neck rigidity. Respiratory: No rhonchi or crepitations. Cardiovascular: S1-S2 heard. Abdomen: Soft nontender bowel sound present. Musculoskeletal: No edema. Skin: No rash. Neurologic: Alert awake oriented to time place and person.  Moves all extremities. Psychiatric: Appears normal.  Normal affect.   Labs on Admission: I have personally reviewed following labs and imaging studies  CBC: Recent Labs  Lab 08/02/23 1748  WBC 5.1  HGB 12.9  HCT 40.8  MCV 85.4  PLT 193   Basic Metabolic Panel: Recent Labs  Lab 08/02/23 1748  NA 142  K 3.5  CL 105  CO2 24  GLUCOSE 79  BUN <5*  CREATININE 0.88  CALCIUM 9.2   GFR: CrCl cannot be calculated (Unknown ideal weight.). Liver Function Tests: No results for input(s): "AST", "ALT", "ALKPHOS", "BILITOT", "PROT", "ALBUMIN" in the last 168 hours. No results for input(s): "LIPASE", "AMYLASE" in the last 168 hours. No results for input(s): "AMMONIA" in the last 168 hours. Coagulation Profile: No results for input(s): "INR", "PROTIME" in the last 168 hours. Cardiac Enzymes: No results for input(s): "  CKTOTAL", "CKMB", "CKMBINDEX", "TROPONINI" in the last 168 hours. BNP (last 3 results) No results for input(s): "PROBNP" in the last 8760 hours. HbA1C: No results for input(s): "HGBA1C" in the last 72 hours. CBG: No results for input(s): "GLUCAP" in the last 168 hours. Lipid Profile: No results for input(s): "CHOL", "HDL", "LDLCALC", "TRIG", "CHOLHDL", "LDLDIRECT" in the last 72 hours. Thyroid Function Tests: No results for input(s): "TSH", "T4TOTAL", "FREET4", "T3FREE", "THYROIDAB" in the last 72  hours. Anemia Panel: No results for input(s): "VITAMINB12", "FOLATE", "FERRITIN", "TIBC", "IRON", "RETICCTPCT" in the last 72 hours. Urine analysis:    Component Value Date/Time   COLORURINE YELLOW 03/25/2014 0830   APPEARANCEUR CLEAR 03/25/2014 0830   APPEARANCEUR Clear 03/25/2014 0027   LABSPEC 1.017 03/25/2014 0830   LABSPEC 1.020 03/25/2014 0027   PHURINE 5.5 03/25/2014 0830   GLUCOSEU NEGATIVE 03/25/2014 0830   GLUCOSEU Negative 03/25/2014 0027   HGBUR NEGATIVE 03/25/2014 0830   BILIRUBINUR NEGATIVE 03/25/2014 0830   BILIRUBINUR Negative 03/25/2014 0027   KETONESUR 15 (A) 03/25/2014 0830   PROTEINUR NEGATIVE 03/25/2014 0830   UROBILINOGEN 0.2 03/25/2014 0830   NITRITE NEGATIVE 03/25/2014 0830   LEUKOCYTESUR NEGATIVE 03/25/2014 0830   LEUKOCYTESUR Negative 03/25/2014 0027   Sepsis Labs: @LABRCNTIP (procalcitonin:4,lacticidven:4) )No results found for this or any previous visit (from the past 240 hours).   Radiological Exams on Admission: DG Chest 2 View Result Date: 08/03/2023 CLINICAL DATA:  Chest pain EXAM: CHEST - 2 VIEW COMPARISON:  10/14/2022 FINDINGS: Cardiac shadow is within normal limits. The lungs are well aerated bilaterally. No focal infiltrate or effusion is seen. No bony abnormality is noted. IMPRESSION: No active cardiopulmonary disease. Electronically Signed   By: Violeta Grey M.D.   On: 08/03/2023 00:30    EKG: Independently reviewed.  Sinus bradycardia heart rate of 57 bpm.  Assessment/Plan Principal Problem:   Alcohol  withdrawal (HCC) Active Problems:   Cocaine dependence (HCC)   Depressive disorder    Alcohol  withdrawal on CIWA protocol.  Thiamine .  Advised about quitting.  Social work consult. Polysubstance abuse social work consult.  Advised about quitting.  Admits to taking cocaine. Sinus bradycardia will check TSH.  Monitor on telemetry.  Asymptomatic. Depression on Abilify and Remeron .  Since patient has alcohol  withdrawal symptoms will need  close monitoring and more than 2 midnight stay.   DVT prophylaxis: Lovenox . Code Status: Full code. Family Communication: Discussed with patient. Disposition Plan: Monitored bed. Consults called: Child psychotherapist. Admission status: Observation.

## 2023-08-03 NOTE — Progress Notes (Signed)
 Patient admitted earlier this morning.  H&P reviewed.  Patient seen and examined.  Patient complains of pain all over.  She feels tremulous.  She is concerned about her hernia in the left lower abdomen.  Eating her breakfast.  Informed subsequently by nursing staff that the patient had an episode of vomiting.  Vital signs reviewed.  Lungs are clear to auscultation bilaterally. S1-S2 is normal regular. Abdomen is soft.  Mildly tender in the left lower quadrant without any rebound rigidity or guarding. Noted to be tremulous.  No focal neurological deficits.  Labs reviewed.  Will check magnesium  level.  LFTs are noted to be normal.  Patient admitted for alcohol  withdrawal.  Seems to have mild symptoms.  Continue Ativan  CIWA protocol.  TOC consulted.  Noted to have sinus bradycardia also TSH is pending.  History of depression for which she is on Abilify.  She mentions a history of hernia in the left lower abdomen.  She underwent a CT scan of her abdomen pelvis in March when she was at [employer].  Report is available in Care Everywhere.  That particular study showed small to moderate hiatal hernia.  No other hernia was noted.  Due to her episode of emesis we will proceed with abdominal film.  Other issues as per H&P.  Will continue to monitor.  Casimer Russett 08/03/2023

## 2023-08-03 NOTE — Progress Notes (Signed)
 CSW added substance abuse resources to patient's AVS.  Edwin Dada, MSW, LCSW Transitions of Care  Clinical Social Worker II [phone]

## 2023-08-03 NOTE — Discharge Summary (Signed)
  [facility]  Physician Discharge Summary   Patient ID: THEODOSIA RUSSIN MRN: [id number] DOB/AGE: Jan 11, 1981 43 y.o.  Admit date: 08/02/2023 Discharge date: 08/03/2023    PCP: Pcp, No  DISCHARGE DIAGNOSES:  Principal Problem:   Alcohol  withdrawal (HCC) Active Problems:   Cocaine dependence (HCC)   Depressive disorder   Sinus bradycardia  PATIENT LEFT AGAINST MEDICAL ADVICE  INITIAL HISTORY: AMARAE BILLMAN is a 43 y.o. female with history of polysubstance abuse including alcohol , cocaine and history of depression presents to the ER with requesting to be placed on alcohol  detox.  Patient states that she drinks all kind of alcohol  including wine and liquor.  Last drink was yesterday afternoon.  Admits to taking cocaine.   ED Course: In the ER patient is found to be tremulous and concerning for alcohol  withdrawal.  EKG shows sinus bradycardia with heart rate around 57 bpm.  HOSPITAL COURSE:   Alcohol  withdrawal on CIWA protocol.  Thiamine .  Advised about quitting.  Social work consult. Polysubstance abuse social work consult.  Advised about quitting.  Admits to taking cocaine. Sinus bradycardia  Depression on Abilify and Remeron .  Patient developed nausea and vomiting. She had multiple episodes and so diet was downgraded to clear liquids. Abdominal x ray was ordered. Patient was very upset by change in diet despite explanation of rationale. She started behaving very aggressively with nursing staff and became verbally abusive. She then decided to leave AMA before results of Abd x ray were available.      PERTINENT LABS:  The results of significant diagnostics from this hospitalization (including imaging, microbiology, ancillary and laboratory) are listed below for reference.      Labs:   Basic Metabolic Panel: Recent Labs  Lab 08/02/23 1748 08/03/23 0553 08/03/23 0804  NA 142 137  --   K 3.5 3.7  --   CL 105 101  --   CO2 24 25  --   GLUCOSE 79 108*  --    BUN <5* 7  --   CREATININE 0.88 1.00  --   CALCIUM 9.2 8.7*  --   MG  --   --  1.9   Liver Function Tests: Recent Labs  Lab 08/03/23 0553  AST 25  ALT 16  ALKPHOS 50  BILITOT 0.3  PROT 5.1*  ALBUMIN 2.9*    CBC: Recent Labs  Lab 08/02/23 1748 08/03/23 0553  WBC 5.1 4.8  NEUTROABS  --  1.4*  HGB 12.9 12.0  HCT 40.8 38.4  MCV 85.4 86.5  PLT 193 183     Laterica Matarazzo [employer] on [email or web address]  08/03/2023, 3:17 PM
